# Patient Record
Sex: Male | Born: 1935 | Race: White | Hispanic: No | Marital: Married | Smoking: Former smoker
Health system: Southern US, Community
[De-identification: ages and names within clinical notes are randomized; demographics above are authoritative.]

## PROBLEM LIST (undated history)

## (undated) DIAGNOSIS — M1990 Unspecified osteoarthritis, unspecified site: Secondary | ICD-10-CM

## (undated) DIAGNOSIS — I639 Cerebral infarction, unspecified: Secondary | ICD-10-CM

## (undated) HISTORY — PX: TONSILLECTOMY: SUR1361

## (undated) HISTORY — PX: OTHER SURGICAL HISTORY: SHX169

## (undated) HISTORY — PX: APPENDECTOMY: SHX54

---

## 2006-06-29 ENCOUNTER — Ambulatory Visit: Payer: Self-pay | Admitting: Gastroenterology

## 2006-07-02 ENCOUNTER — Ambulatory Visit: Payer: Self-pay | Admitting: Gastroenterology

## 2006-07-02 ENCOUNTER — Ambulatory Visit (HOSPITAL_COMMUNITY): Admission: RE | Admit: 2006-07-02 | Discharge: 2006-07-02 | Payer: Self-pay | Admitting: Gastroenterology

## 2006-10-04 ENCOUNTER — Ambulatory Visit: Payer: Self-pay | Admitting: Gastroenterology

## 2006-10-10 ENCOUNTER — Ambulatory Visit (HOSPITAL_COMMUNITY): Admission: RE | Admit: 2006-10-10 | Discharge: 2006-10-10 | Payer: Self-pay | Admitting: Gastroenterology

## 2006-10-10 ENCOUNTER — Ambulatory Visit: Payer: Self-pay | Admitting: Gastroenterology

## 2006-12-19 ENCOUNTER — Ambulatory Visit: Payer: Self-pay | Admitting: Gastroenterology

## 2007-01-04 ENCOUNTER — Ambulatory Visit (HOSPITAL_COMMUNITY): Admission: RE | Admit: 2007-01-04 | Discharge: 2007-01-04 | Payer: Self-pay | Admitting: Gastroenterology

## 2007-01-06 ENCOUNTER — Ambulatory Visit: Payer: Self-pay | Admitting: Gastroenterology

## 2007-01-22 ENCOUNTER — Ambulatory Visit: Payer: Self-pay | Admitting: Gastroenterology

## 2011-07-02 ENCOUNTER — Emergency Department (HOSPITAL_COMMUNITY)
Admission: EM | Admit: 2011-07-02 | Discharge: 2011-07-02 | Disposition: A | Discharge status: Home / Self Care | Payer: Medicare PPO | Attending: Emergency Medicine | Admitting: Emergency Medicine

## 2011-07-02 ENCOUNTER — Encounter: Payer: Self-pay | Admitting: *Deleted

## 2011-07-02 ENCOUNTER — Emergency Department (HOSPITAL_COMMUNITY): Payer: Medicare PPO

## 2011-07-02 DIAGNOSIS — X500XXA Overexertion from strenuous movement or load, initial encounter: Secondary | ICD-10-CM | POA: Insufficient documentation

## 2011-07-02 DIAGNOSIS — M1710 Unilateral primary osteoarthritis, unspecified knee: Secondary | ICD-10-CM | POA: Insufficient documentation

## 2011-07-02 DIAGNOSIS — M79609 Pain in unspecified limb: Secondary | ICD-10-CM | POA: Insufficient documentation

## 2011-07-02 DIAGNOSIS — IMO0001 Reserved for inherently not codable concepts without codable children: Secondary | ICD-10-CM | POA: Insufficient documentation

## 2011-07-02 DIAGNOSIS — M7918 Myalgia, other site: Secondary | ICD-10-CM

## 2011-07-02 HISTORY — DX: Unspecified osteoarthritis, unspecified site: M19.90

## 2011-07-02 MED ORDER — HYDROCODONE-ACETAMINOPHEN 5-325 MG PO TABS
1.0000 | ORAL_TABLET | Freq: Once | ORAL | Status: AC
  Administered 2011-07-02: 1 via ORAL
  Filled 2011-07-02: qty 1

## 2011-07-02 MED ORDER — HYDROCODONE-ACETAMINOPHEN 5-325 MG PO TABS: ORAL_TABLET | ORAL | Status: AC

## 2011-07-02 NOTE — ED Notes (Signed)
Pt reporting decreased pain at present time.  No distress noted.  Denies any needs or concerns.

## 2011-07-02 NOTE — ED Notes (Signed)
Pt states lower back pain, radiating down right leg. Pt states right knee has also been giving out. Unable to sleep well x 3 nights due to pain.

## 2011-07-02 NOTE — ED Provider Notes (Signed)
History     CSN: [id number] Arrival date & time: 07/02/2011  5:01 PM  Chief Complaint  Patient presents with  . Back Pain    (Consider location/radiation/quality/duration/timing/severity/associated sxs/prior treatment) Patient is a 75 y.o. male presenting with back pain. The history is provided by the patient.  Back Pain  This is a new (He helped to lift a trailer of a hitch and push it away from a truck 3 days ago.  Since he has had severe pain in his right buttock and hip radiating down his right lateral thigh.) problem. The problem occurs constantly. The problem has not changed since onset.The quality of the pain is described as stabbing. The pain is at a severity of 3/10. The pain is severe. The pain is the same all the time (worse when he tries to lie on his back or his right side.). Associated symptoms include pelvic pain and leg pain. Pertinent negatives include no chest pain, no fever, no numbness, no headaches, no abdominal pain, no bowel incontinence, no perianal numbness, no paresthesias, no paresis, no tingling and no weakness. Associated symptoms comments: His right leg has collapsed on him twice,  Felt to be due to severe pain.  He denies weakness.. He has tried heat for the symptoms. The treatment provided mild relief.    Past Medical History  Diagnosis Date  . Arthritis     left knee    Past Surgical History  Procedure Date  . Appendectomy   . Tonsillectomy   . Left knee surgery     No family history on file.  History  Substance Use Topics  . Smoking status: Never Smoker   . Smokeless tobacco: Not on file  . Alcohol Use: No      Review of Systems  Constitutional: Negative for fever.  HENT: Negative for congestion, sore throat and neck pain.   Eyes: Negative.   Respiratory: Negative for chest tightness and shortness of breath.   Cardiovascular: Negative for chest pain.  Gastrointestinal: Negative for nausea, abdominal pain and bowel incontinence.    Genitourinary: Positive for pelvic pain.  Musculoskeletal: Positive for back pain. Negative for joint swelling and arthralgias.  Skin: Negative.  Negative for rash and wound.  Neurological: Negative for dizziness, tingling, weakness, light-headedness, numbness, headaches and paresthesias.  Hematological: Negative.   Psychiatric/Behavioral: Negative.     Allergies  Review of patient's allergies indicates no known allergies.  Home Medications  No current outpatient prescriptions on file.  BP 167/77  Pulse 55  Temp(Src) 98.2 F (36.8 C) (Oral)  Resp 16  Ht 5\' 10"  (1.778 m)  Wt 130 lb (58.968 kg)  BMI 18.65 kg/m2  SpO2 100%  Physical Exam  Constitutional: He is oriented to person, place, and time. He appears well-developed and well-nourished.  HENT:  Head: Normocephalic.  Eyes: Conjunctivae are normal.  Neck: Normal range of motion. Neck supple.  Cardiovascular: Regular rhythm and intact distal pulses.        Pedal pulses normal.  Pulmonary/Chest: Effort normal. He has no wheezes.  Abdominal: Soft. Bowel sounds are normal. He exhibits no distension and no mass.  Musculoskeletal: Normal range of motion. He exhibits no edema.       Lumbar back: He exhibits tenderness. He exhibits no swelling, no edema and no spasm.       Back:  Neurological: He is alert and oriented to person, place, and time. He has normal strength. He displays no atrophy and no tremor. No cranial nerve deficit or sensory  deficit. Gait normal.  Reflex Scores:      Patellar reflexes are 2+ on the right side and 2+ on the left side.      Achilles reflexes are 2+ on the right side and 2+ on the left side.      No strength deficit noted in hip and knee flexor and extensor muscle groups.  Ankle flexion and extension intact.  Skin: Skin is warm and dry.  Psychiatric: He has a normal mood and affect.    ED Course  Procedures (including critical care time)  Labs Reviewed - No data to display Dg Lumbar Spine  Complete  07/02/2011  *RADIOLOGY REPORT*  Clinical Data: Low back pain.  LUMBAR SPINE - COMPLETE 4+ VIEW  Comparison: None.  Findings: Mild degenerative disc disease at the lumbosacral junction.  Transitional anatomy at the lumbosacral junction.  No fracture.  SI joints are symmetric and unremarkable.  No malalignment.  IMPRESSION: No acute findings.  Original Report Authenticated By: Raelyn Number, M.D.   Dg Hip Complete Right  07/02/2011  *RADIOLOGY REPORT*  Clinical Data: Pain.  RIGHT HIP - COMPLETE 2+ VIEW  Comparison: None.  Findings: SI joints and hip joints are symmetric. No acute bony abnormality.  Specifically, no fracture, subluxation, or dislocation.  Soft tissues are intact.  IMPRESSION: No acute bony abnormality.  Original Report Authenticated By: Raelyn Number, M.D.     No diagnosis found.    MDM  Patient's pain almost resolved with hydrocodone 1 tab.  Patients labs and/or radiological studies were reviewed during the medical decision making and disposition process.     Myofascial strain.  Hydrocodone.  Heat.  F/u pcp in 1 week if not completely resolved.        Fulton Reek, [place] 07/02/11 1951

## 2011-07-04 NOTE — ED Provider Notes (Signed)
Medical screening examination/treatment/procedure(s) were performed by non-physician practitioner and as supervising physician I was immediately available for consultation/collaboration.  Jasper Riling. Alvino Chapel, MD 07/04/11 [phone]

## 2011-07-09 ENCOUNTER — Other Ambulatory Visit: Payer: Self-pay

## 2011-07-09 ENCOUNTER — Emergency Department (HOSPITAL_COMMUNITY): Payer: Medicare PPO

## 2011-07-09 ENCOUNTER — Encounter (HOSPITAL_COMMUNITY): Payer: Self-pay | Admitting: Emergency Medicine

## 2011-07-09 ENCOUNTER — Emergency Department (HOSPITAL_COMMUNITY)
Admission: EM | Admit: 2011-07-09 | Discharge: 2011-07-09 | Disposition: A | Discharge status: Home / Self Care | Payer: Medicare PPO | Attending: Emergency Medicine | Admitting: Emergency Medicine

## 2011-07-09 DIAGNOSIS — R0789 Other chest pain: Secondary | ICD-10-CM

## 2011-07-09 DIAGNOSIS — R42 Dizziness and giddiness: Secondary | ICD-10-CM | POA: Insufficient documentation

## 2011-07-09 DIAGNOSIS — M129 Arthropathy, unspecified: Secondary | ICD-10-CM | POA: Insufficient documentation

## 2011-07-09 DIAGNOSIS — M545 Low back pain, unspecified: Secondary | ICD-10-CM | POA: Insufficient documentation

## 2011-07-09 DIAGNOSIS — R071 Chest pain on breathing: Secondary | ICD-10-CM | POA: Insufficient documentation

## 2011-07-09 LAB — CBC
Hemoglobin: 13.8 g/dL (ref 13.0–17.0)
MCH: 30.8 pg (ref 26.0–34.0)
Platelets: 142 10*3/uL — ABNORMAL LOW (ref 150–400)
RBC: 4.48 MIL/uL (ref 4.22–5.81)
WBC: 10.9 10*3/uL — ABNORMAL HIGH (ref 4.0–10.5)

## 2011-07-09 LAB — PRO B NATRIURETIC PEPTIDE: Pro B Natriuretic peptide (BNP): 108.3 pg/mL (ref 0–125)

## 2011-07-09 LAB — DIFFERENTIAL
Eosinophils Absolute: 0.1 10*3/uL (ref 0.0–0.7)
Lymphocytes Relative: 9 % — ABNORMAL LOW (ref 12–46)
Lymphs Abs: 1 10*3/uL (ref 0.7–4.0)
Monocytes Relative: 4 % (ref 3–12)
Neutro Abs: 9.3 10*3/uL — ABNORMAL HIGH (ref 1.7–7.7)
Neutrophils Relative %: 86 % — ABNORMAL HIGH (ref 43–77)

## 2011-07-09 LAB — BASIC METABOLIC PANEL
Calcium: 9.3 mg/dL (ref 8.4–10.5)
GFR calc Af Amer: 64 mL/min — ABNORMAL LOW (ref >90)
GFR calc non Af Amer: 55 mL/min — ABNORMAL LOW (ref >90)
Potassium: 3.7 mEq/L (ref 3.5–5.1)
Sodium: 134 mEq/L — ABNORMAL LOW (ref 135–145)

## 2011-07-09 LAB — CK TOTAL AND CKMB (NOT AT ARMC)
CK, MB: 4.8 ng/mL — ABNORMAL HIGH (ref 0.3–4.0)
Relative Index: 4 — ABNORMAL HIGH (ref 0.0–2.5)
Total CK: 121 U/L (ref 7–232)

## 2011-07-09 MED ORDER — HYDROCODONE-ACETAMINOPHEN 5-325 MG PO TABS: 1.0000 | ORAL_TABLET | Freq: Four times a day (QID) | ORAL | Status: AC | PRN

## 2011-07-09 MED ORDER — MORPHINE SULFATE 2 MG/ML IJ SOLN
2.0000 mg | Freq: Once | INTRAMUSCULAR | Status: AC
  Administered 2011-07-09: 2 mg via INTRAVENOUS

## 2011-07-09 MED ORDER — NITROGLYCERIN 0.4 MG SL SUBL
0.4000 mg | SUBLINGUAL_TABLET | Freq: Once | SUBLINGUAL | Status: AC
  Administered 2011-07-09: 0.4 mg via SUBLINGUAL
  Filled 2011-07-09: qty 75

## 2011-07-09 MED ORDER — SODIUM CHLORIDE 0.9 % IV SOLN
INTRAVENOUS | Status: DC
  Administered 2011-07-09: 13:00:00 via INTRAVENOUS

## 2011-07-09 MED ORDER — MORPHINE SULFATE 2 MG/ML IJ SOLN
INTRAMUSCULAR | Status: AC
  Filled 2011-07-09: qty 1

## 2011-07-09 MED ORDER — ASPIRIN 325 MG PO TABS
325.0000 mg | ORAL_TABLET | Freq: Once | ORAL | Status: AC
  Administered 2011-07-09: 325 mg via ORAL
  Filled 2011-07-09: qty 1

## 2011-07-09 MED ORDER — SODIUM CHLORIDE 0.9 % IV BOLUS (SEPSIS)
250.0000 mL | Freq: Once | INTRAVENOUS | Status: AC
  Administered 2011-07-09: 1000 mL via INTRAVENOUS

## 2011-07-09 MED ORDER — IOHEXOL 350 MG/ML SOLN
100.0000 mL | Freq: Once | INTRAVENOUS | Status: AC | PRN
  Administered 2011-07-09: 100 mL via INTRAVENOUS

## 2011-07-09 MED ORDER — ONDANSETRON HCL 4 MG/2ML IJ SOLN
4.0000 mg | Freq: Once | INTRAMUSCULAR | Status: AC
  Administered 2011-07-09: 4 mg via INTRAVENOUS
  Filled 2011-07-09: qty 2

## 2011-07-09 NOTE — ED Notes (Signed)
C/o pain Left Shoulder onset this a.m. While watching TV ---shortly thereafter, pain moved to upper chest and across chest--Now c/o pain across upper chest which is increased with inspiration--No nausea or vomiting and no actual SOA

## 2011-07-09 NOTE — ED Notes (Signed)
B/P 134/72  HR  85 SR--c/o headache 2nd to NTG--Order for Morphine rcd and given--Tolerated well

## 2011-07-09 NOTE — ED Notes (Signed)
Pt. Was given two NTG SL---did not decrease his chest discomfort.

## 2011-07-09 NOTE — ED Notes (Signed)
B/P 146/68  SR  79---Still rates pain across chest a 4--no change with the first NTG---2nd NTG given

## 2011-07-09 NOTE — ED Notes (Signed)
Still c/o headache--waiting for lab results of final troponin.

## 2011-07-09 NOTE — ED Provider Notes (Signed)
Scribed for Mervin Kung, MD, the patient was seen in room APA04/APA04. This chart was scribed by [employer]. The patient's care started at 12:28  CSN: [id number] Arrival date & time: 07/09/2011 11:28 AM   First MD Initiated Contact with Patient 07/09/11 1228      Chief Complaint  Patient presents with  . Pleurisy   HPI ANDREE UGALDE is a 75 y.o. male who presents to the Emergency Department complaining of Chest Pain. Pain is localized to the left chest and sub sternal. Pain radiates into the neck and the right side of the chest. Pain is persistent and is aggravated by inhalation. Patient denies cough or a history of heart problems. Reports feeling nauseated after taking 2 baby aspirin. Denies diaphoresis, swelling in the legs, dysuria or headache. Reports mild dizziness. Reports back pain last week secondary to exertion.  Past Medical History  Diagnosis Date  . Arthritis     left knee    Past Surgical History  Procedure Date  . Appendectomy   . Tonsillectomy   . Left knee surgery     History reviewed. No pertinent family history.  History  Substance Use Topics  . Smoking status: Never Smoker   . Smokeless tobacco: Not on file  . Alcohol Use: No      Review of Systems  Respiratory: Negative for cough.   Gastrointestinal: Negative for vomiting and abdominal pain.  Genitourinary: Negative for dysuria.  Musculoskeletal: Positive for back pain (lower back).  Neurological: Positive for dizziness. Negative for headaches.  All other systems reviewed and are negative.    Allergies  Review of patient's allergies indicates no known allergies.  Home Medications   Current Outpatient Rx  Name Route Sig Dispense Refill  . ASPIRIN 81 MG PO TBEC Oral Take 162 mg by mouth once.      [name] HYDROCODONE-ACETAMINOPHEN 5-325 MG PO TABS  1/2 to 1 tablet by mouth every 4 hours prn pain 20 tablet 0  . MENS MULTIPLUS PO Oral Take 1 tablet by mouth daily.        BP 152/74   Pulse 75  Temp(Src) 97.4 F (36.3 C) (Oral)  Resp 20  Ht 5\' 10"  (1.778 m)  Wt 132 lb (59.875 kg)  BMI 18.94 kg/m2  SpO2 99%  Physical Exam  Nursing note and vitals reviewed. Constitutional: He is oriented to person, place, and time. He appears well-developed and well-nourished. No distress.       Awake, alert, nontoxic appearance with baseline speech for patient.  HENT:  Head: Normocephalic and atraumatic.  Mouth/Throat: Oropharynx is clear and moist. No oropharyngeal exudate.  Eyes: EOM are normal. Pupils are equal, round, and reactive to light. Right eye exhibits no discharge. Left eye exhibits no discharge.  Neck: Neck supple.  Cardiovascular: Normal rate, regular rhythm and normal heart sounds.   No murmur heard. Pulmonary/Chest: Effort normal and breath sounds normal. No stridor. No respiratory distress. He has no wheezes. He has no rales. He exhibits no tenderness.  Abdominal: Soft. Bowel sounds are normal. He exhibits no mass. There is no tenderness. There is no rebound.  Musculoskeletal: Normal range of motion. He exhibits no edema and no tenderness.       Baseline ROM, moves extremities with no obvious new focal weakness.  Lymphadenopathy:    He has no cervical adenopathy.  Neurological: He is alert and oriented to person, place, and time. No cranial nerve deficit.       Awake, alert, cooperative and  aware of situation; motor strength bilaterally; sensation normal to light touch bilaterally; peripheral visual fields full to confrontation; no facial asymmetry; tongue midline; major cranial nerves appear intact;  Skin: Skin is warm and dry. No rash noted. He is not diaphoretic. No erythema.  Psychiatric: He has a normal mood and affect.    ED Course  Procedures   DIAGNOSTIC STUDIES: Oxygen Saturation is 99% on [place], normal by my interpretation.    COORDINATION OF CARE: 12:40 - EDP examined patient at bedside and ordered the following Orders Placed This Encounter    Procedures  . DG Chest Portable 1 View  . Basic metabolic panel  . CBC  . Differential  . CK total and CKMB  . Troponin I  . Cardiac monitoring      Results for orders placed during the hospital encounter of [id number]  BASIC METABOLIC PANEL      Component Value Range   Sodium 134 (*) 135 - 145 (mEq/L)   Potassium 3.7  3.5 - 5.1 (mEq/L)   Chloride 99  96 - 112 (mEq/L)   CO2 28  19 - 32 (mEq/L)   Glucose, Bld 132 (*) 70 - 99 (mg/dL)   BUN 23  6 - 23 (mg/dL)   Creatinine, Ser 1.25  0.50 - 1.35 (mg/dL)   Calcium 9.3  8.4 - 10.5 (mg/dL)   GFR calc non Af Amer 55 (*) >90 (mL/min)   GFR calc Af Amer 64 (*) >90 (mL/min)  CBC      Component Value Range   WBC 10.9 (*) 4.0 - 10.5 (K/uL)   RBC 4.48  4.22 - 5.81 (MIL/uL)   Hemoglobin 13.8  13.0 - 17.0 (g/dL)   HCT 41.0  39.0 - 52.0 (%)   MCV 91.5  78.0 - 100.0 (fL)   MCH 30.8  26.0 - 34.0 (pg)   MCHC 33.7  30.0 - 36.0 (g/dL)   RDW 13.0  11.5 - 15.5 (%)   Platelets 142 (*) 150 - 400 (K/uL)  DIFFERENTIAL      Component Value Range   Neutrophils Relative 86 (*) 43 - 77 (%)   Neutro Abs 9.3 (*) 1.7 - 7.7 (K/uL)   Lymphocytes Relative 9 (*) 12 - 46 (%)   Lymphs Abs 1.0  0.7 - 4.0 (K/uL)   Monocytes Relative 4  3 - 12 (%)   Monocytes Absolute 0.5  0.1 - 1.0 (K/uL)   Eosinophils Relative 1  0 - 5 (%)   Eosinophils Absolute 0.1  0.0 - 0.7 (K/uL)   Basophils Relative 0  0 - 1 (%)   Basophils Absolute 0.0  0.0 - 0.1 (K/uL)  CK TOTAL AND CKMB      Component Value Range   Total CK 121  7 - 232 (U/L)   CK, MB 4.8 (*) 0.3 - 4.0 (ng/mL)   Relative Index 4.0 (*) 0.0 - 2.5   TROPONIN I      Component Value Range   Troponin I <0.30  <0.30 (ng/mL)    Dg Chest Portable 1 View  07/09/2011  *RADIOLOGY REPORT*  Clinical Data: Chest pain.  Dyspnea.  PORTABLE CHEST - 1 VIEW  Comparison: None.  Findings: Cardiomegaly.  Central pulmonary vascular prominence. Mildly tortuous aorta.  No infiltrate, congestive heart failure or pneumothorax.   IMPRESSION: Cardiomegaly.  Central pulmonary vascular prominence.  Mildly tortuous aorta.  Original Report Authenticated By: Doug Sou, M.D.    Results for orders placed during the hospital encounter of 07/09/11  BASIC METABOLIC PANEL      Component Value Range   Sodium 134 (*) 135 - 145 (mEq/L)   Potassium 3.7  3.5 - 5.1 (mEq/L)   Chloride 99  96 - 112 (mEq/L)   CO2 28  19 - 32 (mEq/L)   Glucose, Bld 132 (*) 70 - 99 (mg/dL)   BUN 23  6 - 23 (mg/dL)   Creatinine, Ser 1.25  0.50 - 1.35 (mg/dL)   Calcium 9.3  8.4 - 10.5 (mg/dL)   GFR calc non Af Amer 55 (*) >90 (mL/min)   GFR calc Af Amer 64 (*) >90 (mL/min)  CBC      Component Value Range   WBC 10.9 (*) 4.0 - 10.5 (K/uL)   RBC 4.48  4.22 - 5.81 (MIL/uL)   Hemoglobin 13.8  13.0 - 17.0 (g/dL)   HCT 41.0  39.0 - 52.0 (%)   MCV 91.5  78.0 - 100.0 (fL)   MCH 30.8  26.0 - 34.0 (pg)   MCHC 33.7  30.0 - 36.0 (g/dL)   RDW 13.0  11.5 - 15.5 (%)   Platelets 142 (*) 150 - 400 (K/uL)  DIFFERENTIAL      Component Value Range   Neutrophils Relative 86 (*) 43 - 77 (%)   Neutro Abs 9.3 (*) 1.7 - 7.7 (K/uL)   Lymphocytes Relative 9 (*) 12 - 46 (%)   Lymphs Abs 1.0  0.7 - 4.0 (K/uL)   Monocytes Relative 4  3 - 12 (%)   Monocytes Absolute 0.5  0.1 - 1.0 (K/uL)   Eosinophils Relative 1  0 - 5 (%)   Eosinophils Absolute 0.1  0.0 - 0.7 (K/uL)   Basophils Relative 0  0 - 1 (%)   Basophils Absolute 0.0  0.0 - 0.1 (K/uL)  CK TOTAL AND CKMB      Component Value Range   Total CK 121  7 - 232 (U/L)   CK, MB 4.8 (*) 0.3 - 4.0 (ng/mL)   Relative Index 4.0 (*) 0.0 - 2.5   TROPONIN I      Component Value Range   Troponin I <0.30  <0.30 (ng/mL)  PRO B NATRIURETIC PEPTIDE      Component Value Range   BNP, POC 108.3  0 - 125 (pg/mL)  TROPONIN I      Component Value Range   Troponin I <0.30  <0.30 (ng/mL)   Dg Lumbar Spine Complete  07/02/2011  *RADIOLOGY REPORT*  Clinical Data: Low back pain.  LUMBAR SPINE - COMPLETE 4+ VIEW  Comparison:  None.  Findings: Mild degenerative disc disease at the lumbosacral junction.  Transitional anatomy at the lumbosacral junction.  No fracture.  SI joints are symmetric and unremarkable.  No malalignment.  IMPRESSION: No acute findings.  Original Report Authenticated By: Raelyn Number, M.D.   Dg Hip Complete Right  07/02/2011  *RADIOLOGY REPORT*  Clinical Data: Pain.  RIGHT HIP - COMPLETE 2+ VIEW  Comparison: None.  Findings: SI joints and hip joints are symmetric. No acute bony abnormality.  Specifically, no fracture, subluxation, or dislocation.  Soft tissues are intact.  IMPRESSION: No acute bony abnormality.  Original Report Authenticated By: Raelyn Number, M.D.   Ct Angio Chest W/cm &/or Wo Cm  07/09/2011  *RADIOLOGY REPORT*  Clinical Data:  Chest pain.  Concern for pulmonary embolism.  CT ANGIOGRAPHY CHEST WITH CONTRAST  Technique:  Multidetector CT imaging of the chest was performed using the standard protocol during bolus administration of intravenous contrast.  Multiplanar CT image reconstructions including MIPs were obtained to evaluate the vascular anatomy.  Contrast: 151mL OMNIPAQUE IOHEXOL 350 MG/ML IV SOLN  Comparison:  Chest radiograph 07/09/2011  Findings:  There are no filling defects in the pulmonary arteries to suggest acute pulmonary embolism.  No acute findings of the aorta or great vessels.  No pericardial fluid.  No axillary or supraclavicular lymphadenopathy.  No mediastinal adenopathy.  Esophagus is normal. Review of the upper abdomen is unremarkable.  Review of the lung parenchyma demonstrates mild basilar atelectasis / vascular congestion.  No evidence of pneumonia.  Airways are normal.  Review of  bone windows demonstrates no aggressive osseous lesions.  Review of the MIP images confirms the above findings.  IMPRESSION:  1.  No acute pulmonary embolism. 2.  Mild basilar atelectasis.  Original Report Authenticated By: Suzy Bouchard, M.D.   Dg Chest Portable 1 View  07/09/2011   *RADIOLOGY REPORT*  Clinical Data: Chest pain.  Dyspnea.  PORTABLE CHEST - 1 VIEW  Comparison: None.  Findings: Cardiomegaly.  Central pulmonary vascular prominence. Mildly tortuous aorta.  No infiltrate, congestive heart failure or pneumothorax.  IMPRESSION: Cardiomegaly.  Central pulmonary vascular prominence.  Mildly tortuous aorta.  Original Report Authenticated By: Doug Sou, M.D.      Date: 07/09/2011  Rate: 67  Rhythm: normal sinus rhythm  QRS Axis: normal  Intervals: normal  ST/T Wave abnormalities: normal  Conduction Disutrbances:none  Narrative Interpretation:   Old EKG Reviewed: none available    MDM:  CT angiogram negative for pulmonary embolism pneumothorax pulmonary edema or pneumonia. Patient's pleuritic chest pain improved in ED with morphine. We'll obtain a six-hour cardiac marker troponin did be sure that it is negative and therefore ruling out acute cardiac event. Time for that marker to be drawn will be after 4:00 in the afternoon.    Scribe Attestation I personally performed the services described in this documentation, which was scribed in my presence. The recorded information has been reviewed and considered.     Mervin Kung, MD 07/09/11 1536

## 2012-11-02 ENCOUNTER — Encounter (HOSPITAL_COMMUNITY): Payer: Self-pay | Admitting: Emergency Medicine

## 2012-11-02 ENCOUNTER — Emergency Department (HOSPITAL_COMMUNITY)
Admission: EM | Admit: 2012-11-02 | Discharge: 2012-11-03 | Disposition: A | Discharge status: Home / Self Care | Payer: Medicare PPO | Attending: Emergency Medicine | Admitting: Emergency Medicine

## 2012-11-02 DIAGNOSIS — Z7982 Long term (current) use of aspirin: Secondary | ICD-10-CM | POA: Insufficient documentation

## 2012-11-02 DIAGNOSIS — R42 Dizziness and giddiness: Secondary | ICD-10-CM

## 2012-11-02 DIAGNOSIS — Z87891 Personal history of nicotine dependence: Secondary | ICD-10-CM | POA: Insufficient documentation

## 2012-11-02 DIAGNOSIS — Z8739 Personal history of other diseases of the musculoskeletal system and connective tissue: Secondary | ICD-10-CM | POA: Insufficient documentation

## 2012-11-02 DIAGNOSIS — I10 Essential (primary) hypertension: Secondary | ICD-10-CM | POA: Insufficient documentation

## 2012-11-02 NOTE — ED Notes (Signed)
Patient states he watched the news at 10pm and then got up to get ready for bed.  Patient states that he got dizzy and checked his BP at home; states was 198/80.  Patient denies pain at this time; c/o dizziness with movement.

## 2012-11-03 ENCOUNTER — Other Ambulatory Visit: Payer: Self-pay

## 2012-11-03 LAB — BASIC METABOLIC PANEL
BUN: 24 mg/dL — ABNORMAL HIGH (ref 6–23)
Calcium: 9 mg/dL (ref 8.4–10.5)
GFR calc non Af Amer: 49 mL/min — ABNORMAL LOW (ref >90)
Glucose, Bld: 97 mg/dL (ref 70–99)
Sodium: 137 mEq/L (ref 135–145)

## 2012-11-03 LAB — CBC WITH DIFFERENTIAL/PLATELET
Basophils Relative: 1 % (ref 0–1)
Eosinophils Absolute: 0.2 10*3/uL (ref 0.0–0.7)
Eosinophils Relative: 3 % (ref 0–5)
Lymphs Abs: 1.6 10*3/uL (ref 0.7–4.0)
MCH: 31.2 pg (ref 26.0–34.0)
MCHC: 34.2 g/dL (ref 30.0–36.0)
MCV: 91.1 fL (ref 78.0–100.0)
Monocytes Relative: 7 % (ref 3–12)
Platelets: 143 10*3/uL — ABNORMAL LOW (ref 150–400)
RBC: 4.17 MIL/uL — ABNORMAL LOW (ref 4.22–5.81)

## 2012-11-03 MED ORDER — SODIUM CHLORIDE 0.9 % IV BOLUS (SEPSIS)
1000.0000 mL | Freq: Once | INTRAVENOUS | Status: AC
  Administered 2012-11-03: 1000 mL via INTRAVENOUS

## 2012-11-03 NOTE — ED Provider Notes (Addendum)
History     CSN: [id number]  Arrival date & time 11/02/12  2335   First MD Initiated Contact with Patient 11/03/12 0009      Chief Complaint  Patient presents with  . Hypertension  . Dizziness    (Consider location/radiation/quality/duration/timing/severity/associated sxs/prior treatment) HPI Cameron Boyle is a 77 y.o. male who presents to the Emergency Department complaining of hypertension and dizziness. He got up to go to bed tonight and felt dizzy. He went downstairs and picked up wood for his stove and each time he bent over he felt dizzy. He took his blood pressure several times. His blood pressure was 198/88 after taking it and waiting. He is currently dizzy with position changes.   PCP Dr. Everette Rank  Past Medical History  Diagnosis Date  . Arthritis     left knee    Past Surgical History  Procedure Laterality Date  . Appendectomy    . Tonsillectomy    . Left knee surgery      No family history on file.  History  Substance Use Topics  . Smoking status: Former Research scientist (life sciences)  . Smokeless tobacco: Not on file  . Alcohol Use: No      Review of Systems  Constitutional: Negative for fever.       10 Systems reviewed and are negative for acute change except as noted in the HPI.  HENT: Negative for congestion.   Eyes: Negative for discharge and redness.  Respiratory: Negative for cough and shortness of breath.   Cardiovascular: Negative for chest pain.  Gastrointestinal: Negative for vomiting and abdominal pain.  Musculoskeletal: Negative for back pain.  Skin: Negative for rash.  Neurological: Negative for syncope, numbness and headaches.  Psychiatric/Behavioral:       No behavior change.    Allergies  Review of patient's allergies indicates no known allergies.  Home Medications   Current Outpatient Rx  Name  Route  Sig  Dispense  Refill  . aspirin 81 MG EC tablet   Oral   Take 162 mg by mouth once.           . Multiple Vitamins-Minerals (MENS MULTIPLUS  PO)   Oral   Take 1 tablet by mouth daily.           [name] HYDROcodone-acetaminophen (NORCO) 5-325 MG per tablet      1/2 to 1 tablet by mouth every 4 hours prn pain   20 tablet   0     BP 177/74  Pulse 53  Temp(Src) 97.7 F (36.5 C) (Oral)  Resp 18  Ht 5\' 10"  (1.778 m)  Wt 133 lb (60.328 kg)  BMI 19.08 kg/m2  SpO2 99%  Physical Exam  Nursing note and vitals reviewed. Constitutional: He is oriented to person, place, and time. He appears well-developed and well-nourished.  Awake, alert, nontoxic appearance.  HENT:  Head: Atraumatic.  Eyes: EOM are normal. Pupils are equal, round, and reactive to light. Right eye exhibits no discharge. Left eye exhibits no discharge.  Neck: Normal range of motion. Neck supple.  Cardiovascular: Normal rate and normal heart sounds.   Pulmonary/Chest: Effort normal and breath sounds normal. He exhibits no tenderness.  Abdominal: Soft. There is no tenderness. There is no rebound.  Musculoskeletal: Normal range of motion. He exhibits no tenderness.  Baseline ROM, no obvious new focal weakness.  Neurological: He is alert and oriented to person, place, and time.  Mental status and motor strength appears baseline for patient and situation.  Skin: No  rash noted.  Psychiatric: He has a normal mood and affect.    ED Course  Procedures (including critical care time) Results for orders placed during the hospital encounter of 11/02/12  CBC WITH DIFFERENTIAL      Result Value Range   WBC 5.8  4.0 - 10.5 K/uL   RBC 4.17 (*) 4.22 - 5.81 MIL/uL   Hemoglobin 13.0  13.0 - 17.0 g/dL   HCT 38.0 (*) 39.0 - 52.0 %   MCV 91.1  78.0 - 100.0 fL   MCH 31.2  26.0 - 34.0 pg   MCHC 34.2  30.0 - 36.0 g/dL   RDW 13.2  11.5 - 15.5 %   Platelets 143 (*) 150 - 400 K/uL   Neutrophils Relative 63  43 - 77 %   Neutro Abs 3.7  1.7 - 7.7 K/uL   Lymphocytes Relative 27  12 - 46 %   Lymphs Abs 1.6  0.7 - 4.0 K/uL   Monocytes Relative 7  3 - 12 %   Monocytes Absolute 0.4   0.1 - 1.0 K/uL   Eosinophils Relative 3  0 - 5 %   Eosinophils Absolute 0.2  0.0 - 0.7 K/uL   Basophils Relative 1  0 - 1 %   Basophils Absolute 0.0  0.0 - 0.1 K/uL  BASIC METABOLIC PANEL      Result Value Range   Sodium 137  135 - 145 mEq/L   Potassium 4.2  3.5 - 5.1 mEq/L   Chloride 103  96 - 112 mEq/L   CO2 26  19 - 32 mEq/L   Glucose, Bld 97  70 - 99 mg/dL   BUN 24 (*) 6 - 23 mg/dL   Creatinine, Ser 1.37 (*) 0.50 - 1.35 mg/dL   Calcium 9.0  8.4 - 10.5 mg/dL   GFR calc non Af Amer 49 (*) >90 mL/min   GFR calc Af Amer 57 (*) >90 mL/min    Date: 11/03/2012   0014  Rate: 53  Rhythm: sinus bradycardia  QRS Axis: normal  Intervals: normal  ST/T Wave abnormalities: normal  Conduction Disutrbances:none  Narrative Interpretation:   Old EKG Reviewed: none available   0154 Repeat orthostatics with negative dizziness. MDM  Patient with dizziness, hypertension when getting ready for bed tonight. Given IVF. Labs are unremarkable. After receiving fluids, no further dizziness.Pt stable in ED with no significant deterioration in condition.The patient appears reasonably screened and/or stabilized for discharge and I doubt any other medical condition or other [facility] requiring further screening, evaluation, or treatment in the ED at this time prior to discharge.  MDM Reviewed: nursing note and vitals Interpretation: labs and ECG           Gypsy Balsam. Olin Hauser, MD 11/03/12 0157  Gypsy Balsam. Olin Hauser, MD 11/17/12 [phone]

## 2013-09-02 ENCOUNTER — Other Ambulatory Visit (HOSPITAL_COMMUNITY): Payer: Self-pay | Admitting: Family Medicine

## 2013-09-02 ENCOUNTER — Ambulatory Visit (HOSPITAL_COMMUNITY)
Admission: RE | Admit: 2013-09-02 | Discharge: 2013-09-02 | Disposition: A | Discharge status: Home / Self Care | Payer: Medicare PPO | Source: Ambulatory Visit | Attending: Family Medicine | Admitting: Family Medicine

## 2013-09-02 DIAGNOSIS — Z87891 Personal history of nicotine dependence: Secondary | ICD-10-CM | POA: Insufficient documentation

## 2016-11-14 ENCOUNTER — Telehealth: Payer: Self-pay | Admitting: Gastroenterology

## 2016-11-14 NOTE — Telephone Encounter (Signed)
RECALL FOR TCS °

## 2016-11-14 NOTE — Telephone Encounter (Signed)
Letter mailed

## 2018-07-19 ENCOUNTER — Emergency Department (HOSPITAL_COMMUNITY): Payer: Medicare PPO

## 2018-07-19 ENCOUNTER — Encounter (HOSPITAL_COMMUNITY): Payer: Self-pay | Admitting: Emergency Medicine

## 2018-07-19 ENCOUNTER — Inpatient Hospital Stay (HOSPITAL_COMMUNITY): Payer: Medicare PPO

## 2018-07-19 ENCOUNTER — Other Ambulatory Visit: Payer: Self-pay

## 2018-07-19 ENCOUNTER — Inpatient Hospital Stay (HOSPITAL_COMMUNITY)
Admission: EM | Admit: 2018-07-19 | Discharge: 2018-07-22 | DRG: 041 | Disposition: A | Discharge status: Home / Self Care | Payer: Medicare PPO | Attending: Neurology | Admitting: Neurology

## 2018-07-19 DIAGNOSIS — Z79891 Long term (current) use of opiate analgesic: Secondary | ICD-10-CM

## 2018-07-19 DIAGNOSIS — M1712 Unilateral primary osteoarthritis, left knee: Secondary | ICD-10-CM | POA: Diagnosis not present

## 2018-07-19 DIAGNOSIS — R29810 Facial weakness: Secondary | ICD-10-CM | POA: Diagnosis not present

## 2018-07-19 DIAGNOSIS — R29703 NIHSS score 3: Secondary | ICD-10-CM | POA: Diagnosis not present

## 2018-07-19 DIAGNOSIS — Z9282 Status post administration of tPA (rtPA) in a different facility within the last 24 hours prior to admission to current facility: Secondary | ICD-10-CM | POA: Diagnosis not present

## 2018-07-19 DIAGNOSIS — Z87891 Personal history of nicotine dependence: Secondary | ICD-10-CM

## 2018-07-19 DIAGNOSIS — I639 Cerebral infarction, unspecified: Secondary | ICD-10-CM | POA: Diagnosis present

## 2018-07-19 DIAGNOSIS — I129 Hypertensive chronic kidney disease with stage 1 through stage 4 chronic kidney disease, or unspecified chronic kidney disease: Secondary | ICD-10-CM | POA: Diagnosis present

## 2018-07-19 DIAGNOSIS — I6389 Other cerebral infarction: Secondary | ICD-10-CM | POA: Diagnosis not present

## 2018-07-19 DIAGNOSIS — I5030 Unspecified diastolic (congestive) heart failure: Secondary | ICD-10-CM

## 2018-07-19 DIAGNOSIS — N189 Chronic kidney disease, unspecified: Secondary | ICD-10-CM

## 2018-07-19 DIAGNOSIS — Z7982 Long term (current) use of aspirin: Secondary | ICD-10-CM

## 2018-07-19 DIAGNOSIS — R471 Dysarthria and anarthria: Secondary | ICD-10-CM | POA: Diagnosis present

## 2018-07-19 DIAGNOSIS — R270 Ataxia, unspecified: Secondary | ICD-10-CM | POA: Diagnosis present

## 2018-07-19 DIAGNOSIS — N179 Acute kidney failure, unspecified: Secondary | ICD-10-CM | POA: Diagnosis present

## 2018-07-19 DIAGNOSIS — D631 Anemia in chronic kidney disease: Secondary | ICD-10-CM | POA: Diagnosis present

## 2018-07-19 DIAGNOSIS — I161 Hypertensive emergency: Secondary | ICD-10-CM | POA: Diagnosis not present

## 2018-07-19 DIAGNOSIS — Z79899 Other long term (current) drug therapy: Secondary | ICD-10-CM | POA: Diagnosis not present

## 2018-07-19 DIAGNOSIS — I63411 Cerebral infarction due to embolism of right middle cerebral artery: Principal | ICD-10-CM | POA: Diagnosis present

## 2018-07-19 DIAGNOSIS — I340 Nonrheumatic mitral (valve) insufficiency: Secondary | ICD-10-CM | POA: Diagnosis not present

## 2018-07-19 DIAGNOSIS — N183 Chronic kidney disease, stage 3 (moderate): Secondary | ICD-10-CM | POA: Diagnosis present

## 2018-07-19 DIAGNOSIS — I10 Essential (primary) hypertension: Secondary | ICD-10-CM

## 2018-07-19 DIAGNOSIS — I6340 Cerebral infarction due to embolism of unspecified cerebral artery: Secondary | ICD-10-CM | POA: Diagnosis not present

## 2018-07-19 DIAGNOSIS — E785 Hyperlipidemia, unspecified: Secondary | ICD-10-CM

## 2018-07-19 LAB — URINALYSIS, ROUTINE W REFLEX MICROSCOPIC
Bilirubin Urine: NEGATIVE
Glucose, UA: 50 mg/dL — AB
Hgb urine dipstick: NEGATIVE
Ketones, ur: NEGATIVE mg/dL
Leukocytes, UA: NEGATIVE
Nitrite: NEGATIVE
Protein, ur: 100 mg/dL — AB
Specific Gravity, Urine: 1.008 (ref 1.005–1.030)
pH: 7 (ref 5.0–8.0)

## 2018-07-19 LAB — COMPREHENSIVE METABOLIC PANEL
ALT: 13 U/L (ref 0–44)
AST: 21 U/L (ref 15–41)
Albumin: 3.5 g/dL (ref 3.5–5.0)
Alkaline Phosphatase: 66 U/L (ref 38–126)
Anion gap: 7 (ref 5–15)
BUN: 44 mg/dL — ABNORMAL HIGH (ref 8–23)
CO2: 23 mmol/L (ref 22–32)
Calcium: 8.4 mg/dL — ABNORMAL LOW (ref 8.9–10.3)
Chloride: 112 mmol/L — ABNORMAL HIGH (ref 98–111)
Creatinine, Ser: 3.03 mg/dL — ABNORMAL HIGH (ref 0.61–1.24)
GFR calc Af Amer: 21 mL/min — ABNORMAL LOW (ref >60)
GFR calc non Af Amer: 18 mL/min — ABNORMAL LOW (ref >60)
Glucose, Bld: 122 mg/dL — ABNORMAL HIGH (ref 70–99)
Potassium: 4.4 mmol/L (ref 3.5–5.1)
Sodium: 142 mmol/L (ref 135–145)
Total Bilirubin: 0.5 mg/dL (ref 0.3–1.2)
Total Protein: 6.9 g/dL (ref 6.5–8.1)

## 2018-07-19 LAB — DIFFERENTIAL
Abs Immature Granulocytes: 0.03 10*3/uL (ref 0.00–0.07)
Basophils Absolute: 0.1 10*3/uL (ref 0.0–0.1)
Basophils Relative: 1 %
Eosinophils Absolute: 0.4 10*3/uL (ref 0.0–0.5)
Eosinophils Relative: 7 %
Immature Granulocytes: 1 %
Lymphocytes Relative: 22 %
Lymphs Abs: 1.4 10*3/uL (ref 0.7–4.0)
Monocytes Absolute: 0.4 10*3/uL (ref 0.1–1.0)
Monocytes Relative: 7 %
Neutro Abs: 3.9 10*3/uL (ref 1.7–7.7)
Neutrophils Relative %: 62 %

## 2018-07-19 LAB — CBC
HCT: 32.3 % — ABNORMAL LOW (ref 39.0–52.0)
Hemoglobin: 10 g/dL — ABNORMAL LOW (ref 13.0–17.0)
MCH: 29.8 pg (ref 26.0–34.0)
MCHC: 31 g/dL (ref 30.0–36.0)
MCV: 96.1 fL (ref 80.0–100.0)
Platelets: 161 10*3/uL (ref 150–400)
RBC: 3.36 MIL/uL — ABNORMAL LOW (ref 4.22–5.81)
RDW: 12.6 % (ref 11.5–15.5)
WBC: 6.2 10*3/uL (ref 4.0–10.5)
nRBC: 0 % (ref 0.0–0.2)

## 2018-07-19 LAB — PROTIME-INR
INR: 1.18
Prothrombin Time: 14.9 seconds (ref 11.4–15.2)

## 2018-07-19 LAB — I-STAT TROPONIN, ED: Troponin i, poc: 0.01 ng/mL (ref 0.00–0.08)

## 2018-07-19 LAB — I-STAT CHEM 8, ED
BUN: 41 mg/dL — ABNORMAL HIGH (ref 8–23)
Calcium, Ion: 1.17 mmol/L (ref 1.15–1.40)
Chloride: 107 mmol/L (ref 98–111)
Creatinine, Ser: 3.3 mg/dL — ABNORMAL HIGH (ref 0.61–1.24)
Glucose, Bld: 111 mg/dL — ABNORMAL HIGH (ref 70–99)
HCT: 28 % — ABNORMAL LOW (ref 39.0–52.0)
Hemoglobin: 9.5 g/dL — ABNORMAL LOW (ref 13.0–17.0)
Potassium: 4.4 mmol/L (ref 3.5–5.1)
Sodium: 142 mmol/L (ref 135–145)
TCO2: 24 mmol/L (ref 22–32)

## 2018-07-19 LAB — ECHOCARDIOGRAM COMPLETE: Weight: 1968 oz

## 2018-07-19 LAB — RAPID URINE DRUG SCREEN, HOSP PERFORMED
Amphetamines: NOT DETECTED
Barbiturates: NOT DETECTED
Benzodiazepines: NOT DETECTED
Cocaine: NOT DETECTED
Opiates: NOT DETECTED
Tetrahydrocannabinol: NOT DETECTED

## 2018-07-19 LAB — ETHANOL: Alcohol, Ethyl (B): <10 mg/dL (ref <10)

## 2018-07-19 LAB — CBG MONITORING, ED: Glucose-Capillary: 109 mg/dL — ABNORMAL HIGH (ref 70–99)

## 2018-07-19 LAB — GLUCOSE, CAPILLARY: Glucose-Capillary: 88 mg/dL (ref 70–99)

## 2018-07-19 LAB — APTT: aPTT: 30 seconds (ref 24–36)

## 2018-07-19 MED ORDER — LABETALOL HCL 5 MG/ML IV SOLN
10.0000 mg | Freq: Once | INTRAVENOUS | Status: AC
  Administered 2018-07-19: 10 mg via INTRAVENOUS

## 2018-07-19 MED ORDER — NICARDIPINE HCL IN NACL 20-0.86 MG/200ML-% IV SOLN
0.0000 mg/h | INTRAVENOUS | Status: DC
  Administered 2018-07-19 – 2018-07-20 (×3): 2.5 mg/h via INTRAVENOUS
  Filled 2018-07-19 (×2): qty 200

## 2018-07-19 MED ORDER — NICARDIPINE HCL IN NACL 20-0.86 MG/200ML-% IV SOLN
INTRAVENOUS | Status: AC
  Filled 2018-07-19: qty 200

## 2018-07-19 MED ORDER — SODIUM CHLORIDE 0.9 % IV SOLN
INTRAVENOUS | Status: DC
  Administered 2018-07-19 – 2018-07-20 (×2): via INTRAVENOUS

## 2018-07-19 MED ORDER — SODIUM CHLORIDE 0.9 % IV SOLN
50.0000 mL | Freq: Once | INTRAVENOUS | Status: AC
  Administered 2018-07-19: 20 mL via INTRAVENOUS

## 2018-07-19 MED ORDER — LABETALOL HCL 5 MG/ML IV SOLN
INTRAVENOUS | Status: AC
  Filled 2018-07-19: qty 4

## 2018-07-19 MED ORDER — ALTEPLASE 100 MG IV SOLR
INTRAVENOUS | Status: AC
  Filled 2018-07-19: qty 100

## 2018-07-19 MED ORDER — ASPIRIN 81 MG PO CHEW: 324.0000 mg | CHEWABLE_TABLET | Freq: Once | ORAL | Status: DC

## 2018-07-19 MED ORDER — ACETAMINOPHEN 160 MG/5ML PO SOLN: 650.0000 mg | ORAL | Status: DC | PRN

## 2018-07-19 MED ORDER — ACETAMINOPHEN 650 MG RE SUPP: 650.0000 mg | RECTAL | Status: DC | PRN

## 2018-07-19 MED ORDER — NICARDIPINE HCL IN NACL 20-0.86 MG/200ML-% IV SOLN
0.0000 mg/h | INTRAVENOUS | Status: DC
  Administered 2018-07-19: 5 mg/h via INTRAVENOUS

## 2018-07-19 MED ORDER — ALTEPLASE (STROKE) FULL DOSE INFUSION
0.9000 mg/kg | Freq: Once | INTRAVENOUS | Status: AC
  Administered 2018-07-19: 50.2 mg via INTRAVENOUS

## 2018-07-19 MED ORDER — SODIUM CHLORIDE 0.9 % IV SOLN
INTRAVENOUS | Status: DC | PRN
  Administered 2018-07-19: 500 mL via INTRAVENOUS

## 2018-07-19 MED ORDER — ACETAMINOPHEN 325 MG PO TABS: 650.0000 mg | ORAL_TABLET | ORAL | Status: DC | PRN

## 2018-07-19 MED ORDER — PANTOPRAZOLE SODIUM 40 MG IV SOLR
40.0000 mg | Freq: Every day | INTRAVENOUS | Status: DC
  Administered 2018-07-19: 40 mg via INTRAVENOUS
  Filled 2018-07-19: qty 40

## 2018-07-19 MED ORDER — STROKE: EARLY STAGES OF RECOVERY BOOK
Freq: Once | Status: AC
  Administered 2018-07-19: 14:00:00
  Filled 2018-07-19: qty 1

## 2018-07-19 NOTE — Plan of Care (Signed)
  Problem: Ischemic Stroke/TIA Tissue Perfusion: Goal: Complications of ischemic stroke/TIA will be minimized Outcome: Progressing   Problem: Nutrition: Goal: Dietary intake will improve Outcome: Progressing   Problem: Nutrition: Goal: Risk of aspiration will decrease Outcome: Progressing   Problem: Self-Care: Goal: Ability to communicate needs accurately will improve Outcome: Progressing   Problem: Self-Care: Goal: Verbalization of feelings and concerns over difficulty with self-care will improve Outcome: Progressing   Problem: Self-Care: Goal: Ability to participate in self-care as condition permits will improve Outcome: Progressing   Problem: Health Behavior/Discharge Planning: Goal: Ability to manage health-related needs will improve Outcome: Progressing   Problem: Coping: Goal: Will identify appropriate support needs Outcome: Progressing   Problem: Coping: Goal: Will verbalize positive feelings about self Outcome: Progressing   Problem: Education: Goal: Individualized Educational Video(s) Outcome: Progressing   Problem: Education: Goal: Knowledge of patient specific risk factors addressed and post discharge goals established will improve Outcome: Progressing   Problem: Education: Goal: Knowledge of secondary prevention will improve Outcome: Progressing

## 2018-07-19 NOTE — ED Provider Notes (Addendum)
[facility] EMERGENCY DEPARTMENT Provider Note   CSN: [id number] Arrival date & time: 07/19/18  4765     History   Chief Complaint Chief Complaint  Patient presents with  . Code Stroke    HPI Cameron Boyle is a 82 y.o. male.  HPI   82 year old male with strokelike symptoms.  Patient woke up around 530 this morning in his usual state of health.  He laid back down around 7 AM which he often does in the morning.  He woke up around 8 AM and he felt like his left arm "was dead weight."  Family noticed a facial droop as well and EMS was called.  Reportedly some dysarthria but patient does not feel like his voice is significantly changed.  He is still having left arm weakness.  Denies any acute pain.  No acute visual changes.   Past Medical History:  Diagnosis Date  . Arthritis    left knee    There are no active problems to display for this patient.   Past Surgical History:  Procedure Laterality Date  . APPENDECTOMY    . left knee surgery    . TONSILLECTOMY          Home Medications    Prior to Admission medications   Medication Sig Start Date End Date Taking? Authorizing Provider  aspirin 81 MG EC tablet Take 162 mg by mouth once.      [provider]  HYDROcodone-acetaminophen (NORCO) 5-325 MG per tablet 1/2 to 1 tablet by mouth every 4 hours prn pain 07/02/11   Idol, Almyra Free, PA-C  Multiple Vitamins-Minerals (MENS MULTIPLUS PO) Take 1 tablet by mouth daily.      [provider]    Family History History reviewed. No pertinent family history.  Social History Social History   Tobacco Use  . Smoking status: Former Research scientist (life sciences)  . Smokeless tobacco: Never Used  Substance Use Topics  . Alcohol use: No  . Drug use: No     Allergies   Patient has no known allergies.   Review of Systems Review of Systems  All systems reviewed and negative, other than as noted in HPI.  Physical Exam Updated Vital Signs There were no vitals taken for this  visit.  Physical Exam  Constitutional: He appears well-developed and well-nourished. No distress.  HENT:  Head: Normocephalic and atraumatic.  Eyes: Conjunctivae are normal. Right eye exhibits no discharge. Left eye exhibits no discharge.  Neck: Neck supple.  Cardiovascular: Normal rate, regular rhythm and normal heart sounds. Exam reveals no gallop and no friction rub.  No murmur heard. Pulmonary/Chest: Effort normal and breath sounds normal. No respiratory distress.  Abdominal: Soft. He exhibits no distension. There is no tenderness.  Musculoskeletal: He exhibits no edema or tenderness.  Neurological: He is alert.  Awake.  Alert.  Speech is somewhat slow but not really dysarthric.  He is answering questions appropriately. CN 2-12 intact.  Muscle contractions LUE and some movement but not enough to quite overcome gravity at shoulder or elbow .  Strength is 5 out of 5 right upper and both lower extremities.  He has decreased sensation in his left hand extending up to the shoulder.  Skin: Skin is warm and dry.  Psychiatric: He has a normal mood and affect. His behavior is normal. Thought content normal.  Nursing note and vitals reviewed.    ED Treatments / Results  Labs (all labs ordered are listed, but only abnormal results are displayed) Labs Reviewed  CBC - Abnormal; Notable for the following components:      Result Value   RBC 3.36 (*)    Hemoglobin 10.0 (*)    HCT 32.3 (*)    All other components within normal limits  COMPREHENSIVE METABOLIC PANEL - Abnormal; Notable for the following components:   Chloride 112 (*)    Glucose, Bld 122 (*)    BUN 44 (*)    Creatinine, Ser 3.03 (*)    Calcium 8.4 (*)    GFR calc non Af Amer 18 (*)    GFR calc Af Amer 21 (*)    All other components within normal limits  CBG MONITORING, ED - Abnormal; Notable for the following components:   Glucose-Capillary 109 (*)    All other components within normal limits  I-STAT CHEM 8, ED - Abnormal;  Notable for the following components:   BUN 41 (*)    Creatinine, Ser 3.30 (*)    Glucose, Bld 111 (*)    Hemoglobin 9.5 (*)    HCT 28.0 (*)    All other components within normal limits  ETHANOL  PROTIME-INR  APTT  DIFFERENTIAL  RAPID URINE DRUG SCREEN, HOSP PERFORMED  URINALYSIS, ROUTINE W REFLEX MICROSCOPIC  I-STAT TROPONIN, ED    EKG EKG Interpretation  Date/Time:  Friday July 19 2018 09:29:11 EDT Ventricular Rate:  56 PR Interval:    QRS Duration: 98 QT Interval:  437 QTC Calculation: 422 R Axis:   73 Text Interpretation:  Sinus rhythm Left ventricular hypertrophy Confirmed by Virgel Manifold [phone]) on 07/19/2018 9:35:39 AM   Radiology Ct Head Code Stroke Wo Contrast  Result Date: 07/19/2018 CLINICAL DATA:  Code stroke.  82 year old male left-sided weakness. EXAM: CT HEAD WITHOUT CONTRAST TECHNIQUE: Contiguous axial images were obtained from the base of the skull through the vertex without intravenous contrast. COMPARISON:  Report of brain MRI 07/21/2011. FINDINGS: Brain: Patchy and confluent bilateral cerebral white matter hypodensity. Deep white matter capsule involvement and bilateral deep gray matter heterogeneity. Changes appear progressed since 2012. No acute intracranial hemorrhage identified. No midline shift, mass effect, or evidence of intracranial mass lesion. No ventriculomegaly. No cortical encephalomalacia identified. Vascular: Calcified atherosclerosis at the skull base. No suspicious intracranial vascular hyperdensity. Skull: Negative. Sinuses/Orbits: Prior bilateral paranasal sinus surgery with mild residual mucosal thickening. Tympanic cavities are clear. Other: No acute orbit or scalp soft tissue findings. ASPECTS [facility] Stroke Program Early CT Score) - Ganglionic level infarction (caudate, lentiform nuclei, internal capsule, insula, M1-M3 cortex): 7 - Supraganglionic infarction (M4-M6 cortex): 3 Total score (0-10 with 10 being normal): 10 IMPRESSION: 1. No  acute intracranial hemorrhage or acute cortically based infarct. ASPECTS is 10. 2. Progressed and widespread chronic white matter disease since 2012. 3. Study discussed by telephone with Dr. Virgel Manifold on 07/19/2018 at 09:28 . Electronically Signed   By: Genevie Ann M.D.   On: 07/19/2018 09:29    Procedures Procedures (including critical care time)  CRITICAL CARE Performed by: Virgel Manifold Total critical care time: 35 minutes Critical care time was exclusive of separately billable procedures and treating other patients. Critical care was necessary to treat or prevent imminent or life-threatening deterioration. Critical care was time spent personally by me on the following activities: development of treatment plan with patient and/or surrogate as well as nursing, discussions with consultants, evaluation of patient's response to treatment, examination of patient, obtaining history from patient or surrogate, ordering and performing treatments and interventions, ordering and review of laboratory studies, ordering and review  of radiographic studies, pulse oximetry and re-evaluation of patient's condition.  Medications Ordered in ED Medications  alteplase (ACTIVASE) 1 mg/mL infusion 50.2 mg (50.2 mg Intravenous New Bag/Given 07/19/18 1003)    Followed by  0.9 %  sodium chloride infusion (has no administration in time range)  nicardipine (CARDENE) 20mg  in 0.86% saline 280ml IV infusion (0.1 mg/ml) (5 mg/hr Intravenous Rate/Dose Change 07/19/18 1043)  labetalol (NORMODYNE,TRANDATE) injection 10 mg (10 mg Intravenous Given 07/19/18 0946)  labetalol (NORMODYNE,TRANDATE) injection 10 mg (10 mg Intravenous Given 07/19/18 0951)     Initial Impression / Assessment and Plan / ED Course  I have reviewed the triage vital signs and the nursing notes.  Pertinent labs & imaging results that were available during my care of the patient were reviewed by me and considered in my medical decision making (see chart for  details).     82 year old male with left upper extremity weakness.  Last known normal was 7 AM.  He was activated as a code stroke.  Evaluated by tele-neurology and recommended tPA. Received labetalol 10 mg x1 but remained hypertensive and HR dropping to 40s. He was subsequently started on cardene and received tPA. Discussed with Dr Leonel Ramsay, neurology, at [facility]. Transfer to ICU there. Pt/family updated on plan. He is now reporting that he is having tingling in L arm and strength is mildly improved.   Final Clinical Impressions(s) / ED Diagnoses   Final diagnoses:  Cerebrovascular accident (CVA), unspecified mechanism ([place])  Received intravenous tissue plasminogen activator (tPA) in emergency department    ED Discharge Orders    None          Virgel Manifold, MD 07/19/18 1055

## 2018-07-19 NOTE — ED Notes (Signed)
Patient to CT.

## 2018-07-19 NOTE — ED Notes (Signed)
Verbal consent to admin TPA per family and patient. Verified with Neurologist

## 2018-07-19 NOTE — Progress Notes (Signed)
CODE STROKE 0840 CALL TIME 0901 BEEPER TIME 0911 EXAM STARTED  0913 EXAM FINISHED 0916 EXAM COMPLETED IN EPIC [phone] [place] [facility] CALLED

## 2018-07-19 NOTE — ED Notes (Signed)
cardene titrated down to 5mg  due to drop in BP. Pt resting comfortably with family at bedside. Slight increasing in sensation and movement of left arm. Denies other symptoms.

## 2018-07-19 NOTE — Consult Note (Signed)
Neurology H&P  CC: Left-sided weakness  History is obtained from: Patient  HPI: Cameron Boyle is a 82 y.o. male who reports a history of hypertension without treatment who presents with left-sided weakness that started during a nap earlier today.  He woke up early this morning and was completely normal, and that he laid back down around 7 AM and woke up at 8 AM with left-sided weakness.  He went to any pain in the ED where he was evaluated by tele-neurology with an NIHSS of 3 he was given IV TPA.  He reports that his left-sided weakness markedly improved after IV TPA   LKW: 7 AM tpa given?: yes Modified Rankin Scale: 0-Completely asymptomatic and back to baseline post- stroke  ROS: A 14 point ROS was performed and is negative except as noted in the HPI.   Past Medical History:  Diagnosis Date  . Arthritis    left knee     History reviewed. No pertinent family history.   Social History:  reports that he has quit smoking. He has never used smokeless tobacco. He reports that he does not drink alcohol or use drugs.   Exam: Current vital signs: BP 133/77 (BP Location: Right Arm)   Pulse 60   Temp 98 F (36.7 C) (Oral)   Resp 16   Wt 55.8 kg   SpO2 99%   BMI 17.65 kg/m  Vital signs in last 24 hours: Temp:  [97.8 F (36.6 C)-98 F (36.7 C)] 98 F (36.7 C) (11/01 1305) Pulse Rate:  [44-90] 60 (11/01 1300) Resp:  [11-22] 16 (11/01 1300) BP: (133-215)/(64-89) 133/77 (11/01 1300) SpO2:  [93 %-100 %] 99 % (11/01 1300) Weight:  [55.8 kg] 55.8 kg (11/01 0937)  Physical Exam  Constitutional: Appears well-developed and well-nourished.  Psych: Affect appropriate to situation Eyes: No scleral injection HENT: He has mild bleeding from the corners of his mouth, no bleeding inside his mouth Head: Normocephalic.  Cardiovascular: Normal rate and regular rhythm.  Respiratory: Effort normal and breath sounds normal to anterior ascultation GI: Soft.  No distension. There is no  tenderness.  Skin: WDI  Neuro: Mental Status: Patient is awake, alert, oriented to person, place, month, year, and situation. Patient is able to give a clear and coherent history. No signs of aphasia or neglect Cranial Nerves: II: Visual Fields are full. Pupils are equal, round, and reactive to light.   III,IV, VI: EOMI without ptosis or diploplia.  V: Facial sensation is symmetric to temperature VII: Facial movement with mild left facial weakness VIII: hearing is intact to voice X: Uvula elevates symmetrically XI: Shoulder shrug is symmetric. XII: tongue is midline without atrophy or fasciculations.  Motor: Tone is normal. Bulk is normal. 5/5 strength was present in  Right arm and leg, he has 4+/5 strength of the left leg without drift, 3/5 strength of the left arm  sensory: Sensation is diminished in the left arm but intact in the face and leg  Cerebellar: FNF and HKS with mild intentional tremor in the right arm and legs, unable to perform in the left arm   I have reviewed labs in epic and the results pertinent to this consultation are: CMP- AKI, though his last creatinine was 5 years ago and so the actual acuity of this is on clear Mild normocytic anemia with a hemoglobin of 10  I have reviewed the images obtained: CT head-unremarkable  Primary Diagnosis:  Cerebral infarction due to unspecified artery   Secondary Diagnosis: Accelerated  hypertension(SBP > 180 or DBP > 11) & end organ damage), hypertensive emergency   Impression: 82 year old male with acute infarct status post TPA.  Though he does not get treated for it, he does endorse a history of hypertension which is the only risk factor I was able to elucidate.  He does not take any medications at baseline.  He will need further evaluation and therapy.  Recommendations: - HgbA1c, fasting lipid panel - MRI, MRA  of the brain without contrast - Frequent neuro checks - Echocardiogram - Carotid dopplers -  Prophylactic therapy-number 24 hours - Risk factor modification - Telemetry monitoring - PT consult, OT consult, Speech consult - Stroke team to follow  Roland Rack, MD [facility] [phone]  If 7pm- 7am, please page neurology on call as listed in [place]. 07/19/2018  1:41 PM

## 2018-07-19 NOTE — ED Notes (Signed)
Resting comfortably with NAD noted. Pt family at bedside.

## 2018-07-19 NOTE — ED Notes (Signed)
Pt bp too high for TPA at present time. Labetalol given. Waiting on decrease BP

## 2018-07-19 NOTE — Progress Notes (Signed)
  Echocardiogram 2D Echocardiogram has been performed.  Cameron Boyle 07/19/2018, 3:58 PM

## 2018-07-19 NOTE — ED Triage Notes (Addendum)
Patient brought in by EMS with weakness to left arm, dysarthria, and left facial droop starting at 0800 today. Last known well was 0700 this morning. Dr Wilson Singer at bedside. Patient states he laid back down at 0700 this morning and woke at 0800 with symptoms.

## 2018-07-19 NOTE — ED Notes (Signed)
Pt leaving with [facility] at this time. Pt remains in stable condition with no pain and arm holds at 90 degrees for 10seconds

## 2018-07-19 NOTE — ED Notes (Signed)
Code stroke called at this time per Dr Wilson Singer.

## 2018-07-19 NOTE — Consult Note (Signed)
[facility] TeleNeurology Consult Services   Date of Service:   07/19/2018 09:23:08  Impression:     .  Small Vessel Infarct     .  MCA Distribution  Comments: Non-contrast CT brain showed extensive small vessel ischemic white matter disease.  Mechanism of Stroke: Not Clear  Metrics: [facility] Notification Time: 07/19/2018 09:22:13 Arrival Time: 07/19/2018 09:02:00 Stamp Time: 07/19/2018 09:23:08 Time First Login Attempt: 07/19/2018 09:31:00 Video Start Time: 07/19/2018 09:31:00  Symptoms: Left side weakness NIHSS Start Assessment Time: 07/19/2018 09:37:00 tPA Verbal Order Time: 07/19/2018 09:52:00 Patient is a candidate for tPA. tPA CPOE Order Time: 07/19/2018 09:55:00 Needle Time: 07/19/2018 10:03:57 Weight Noted by Staff: 123 lbs Video End Time: 07/19/2018 10:07:35 Reason for tPA Delay: Delays related to Blood Pressure Management tPA Delay Notes: needed 2 doses of labetalol  CT head showed no acute hemorrhage or acute core infarct. CT head was reviewed.  ER Physician notified of the decision on thrombolytics management on 07/19/2018 09:48:00  Verbal Consent to tPA: I have explained to the Patient and Family the nature of the patient's condition, the use of tPA fibrinolytic agent, and the benefits to be reasonably expected compared with alternative approaches. I have discussed the likelihood of major risks or complications of this procedure including (if applicable) but not limited to loss of limb function, brain damage, paralysis, hemorrhage, infection, complications from transfusion of blood components, drug reactions, blood clots and loss of life. I have also indicated that with any procedure there is always the possibility of an unexpected complication. All questions were answered and Patient and Family express understanding of the treatment plan and consent to the treatment.  Our recommendations are outlined below.  Recommendations: IV  tPA recommended.  tPA bolus given Without Complication.   IV tPA Total Dose - 50.2 mg IV tPA Bolus Dose - 5.0 mg IV tPA Infusion Dose - 45.2 mg  Routine post tPA monitoring including neuro checks and blood pressure control during/after treatment Monitor blood pressure Check blood pressure and NIHSS every 15 min for 2 h, then every 30 min for 6 h, and finally every hour for 16 h.  Manage Blood Pressure per post tPA protocol.      .  Admission to ICU     .  CT brain 24 hours post tPA     .  NPO until swallowing screen performed and passed     .  No antiplatelet agents or anticoagulants (including heparin for DVT prophylaxis) in first 24 hours     .  No Foley catheter, nasogastric tube, arterial catheter or central venous catheter for 24 hr, unless absolutely necessary     .  Telemetry     .  Bedside swallow evaluation     .  HOB less than 30 degrees     .  Euglycemia     .  Avoid hyperthermia, PRN acetaminophen     .  DVT prophylaxis     .  Inpatient Neurology Consultation     .  Stroke evaluation as per inpatient neurology recommendations  Discussed with ED physician    ------------------------------------------------------------------------------  History of Present Illness: Patient is a 82 years old Male.  Patient was brought by EMS for symptoms of Left side weakness  last seen normal at 0700. He woke after a short nap at about 0800 with slurred speech and left arm weakness. EMS noted facial weak news and problem with gait. No h/o hBP, DM, CAD, a. fib., hyperlipidemia. On no  meds. Non-smoker. BP on arrival 231/87 which went to 17164 after labetalol 10 mg IV X2. Glucose 111. Platelet count normal.  CT head showed no acute hemorrhage or acute core infarct. CT head was reviewed.  Last seen normal was within 4.5 hours. There is no history of hemorrhagic complications or intracranial hemorrhage. There is no history of Recent Anticoagulants. There is no history of recent  major surgery. There is no history of recent stroke.  Examination: BP(171/64), Pulse(47) 1A: Level of Consciousness - Alert; keenly responsive + 0 1B: Ask Month and Age - Both Questions Right + 0 1C: Blink Eyes & Squeeze Hands - Performs Both Tasks + 0 2: Test Horizontal Extraocular Movements - Normal + 0 3: Test Visual Fields - No Visual Loss + 0 4: Test Facial Palsy (Use Grimace if Obtunded) - Normal symmetry + 0 5A: Test Left Arm Motor Drift - No Drift for 10 Seconds + 0 5B: Test Right Arm Motor Drift - Drift, hits bed + 2 6A: Test Left Leg Motor Drift - No Drift for 5 Seconds + 0 6B: Test Right Leg Motor Drift - No Drift for 5 Seconds + 0 7: Test Limb Ataxia (FNF/Heel-Shin) - No Ataxia + 0 8: Test Sensation - Mild-Moderate Loss: Less Sharp/More Dull + 1 9: Test Language/Aphasia - Normal; No aphasia + 0 10: Test Dysarthria - Normal + 0 11: Test Extinction/Inattention - No abnormality + 0  NIHSS Score: 3  Patient was informed the Neurology Consult would happen via TeleHealth consult by way of interactive audio and video telecommunications and consented to receiving care in this manner.  Due to the immediate potential for life-threatening deterioration due to underlying acute neurologic illness, I spent 35 minutes providing critical care. This time includes time for face to face visit via telemedicine, review of medical records, imaging studies and discussion of findings with providers, the patient and/or family.   Dr Cindie Laroche   [facility] [phone]

## 2018-07-19 NOTE — ED Notes (Addendum)
Report given to [facility] at this time. They are approximately 20 minutes away

## 2018-07-19 NOTE — ED Notes (Signed)
Patient transported to CT 

## 2018-07-20 ENCOUNTER — Inpatient Hospital Stay (HOSPITAL_COMMUNITY): Payer: Medicare PPO

## 2018-07-20 DIAGNOSIS — Z9282 Status post administration of tPA (rtPA) in a different facility within the last 24 hours prior to admission to current facility: Secondary | ICD-10-CM

## 2018-07-20 DIAGNOSIS — I10 Essential (primary) hypertension: Secondary | ICD-10-CM

## 2018-07-20 DIAGNOSIS — E785 Hyperlipidemia, unspecified: Secondary | ICD-10-CM

## 2018-07-20 DIAGNOSIS — I639 Cerebral infarction, unspecified: Secondary | ICD-10-CM

## 2018-07-20 DIAGNOSIS — I6340 Cerebral infarction due to embolism of unspecified cerebral artery: Secondary | ICD-10-CM

## 2018-07-20 DIAGNOSIS — N183 Chronic kidney disease, stage 3 (moderate): Secondary | ICD-10-CM

## 2018-07-20 DIAGNOSIS — D631 Anemia in chronic kidney disease: Secondary | ICD-10-CM

## 2018-07-20 LAB — LIPID PANEL
Cholesterol: 126 mg/dL (ref 0–200)
HDL: 44 mg/dL
LDL Cholesterol: 76 mg/dL (ref 0–99)
Total CHOL/HDL Ratio: 2.9 ratio
Triglycerides: 32 mg/dL
VLDL: 6 mg/dL (ref 0–40)

## 2018-07-20 LAB — HEMOGLOBIN A1C
Hgb A1c MFr Bld: 5.1 % (ref 4.8–5.6)
MEAN PLASMA GLUCOSE: 99.67 mg/dL

## 2018-07-20 LAB — MRSA PCR SCREENING: MRSA by PCR: NEGATIVE

## 2018-07-20 MED ORDER — ASPIRIN 325 MG PO TABS
325.0000 mg | ORAL_TABLET | Freq: Once | ORAL | Status: AC
  Administered 2018-07-20: 325 mg via ORAL
  Filled 2018-07-20: qty 1

## 2018-07-20 MED ORDER — LABETALOL HCL 5 MG/ML IV SOLN: 10.0000 mg | INTRAVENOUS | Status: DC | PRN

## 2018-07-20 MED ORDER — PANTOPRAZOLE SODIUM 40 MG PO TBEC
40.0000 mg | DELAYED_RELEASE_TABLET | Freq: Every day | ORAL | Status: DC
  Administered 2018-07-20 – 2018-07-22 (×3): 40 mg via ORAL
  Filled 2018-07-20 (×3): qty 1

## 2018-07-20 MED ORDER — AMLODIPINE BESYLATE 10 MG PO TABS
10.0000 mg | ORAL_TABLET | Freq: Every day | ORAL | Status: DC
  Administered 2018-07-20 – 2018-07-22 (×3): 10 mg via ORAL
  Filled 2018-07-20 (×3): qty 1

## 2018-07-20 MED ORDER — ATORVASTATIN CALCIUM 10 MG PO TABS
20.0000 mg | ORAL_TABLET | Freq: Every day | ORAL | Status: DC
  Administered 2018-07-20 – 2018-07-21 (×2): 20 mg via ORAL
  Filled 2018-07-20 (×2): qty 2
  Filled 2018-07-20: qty 1

## 2018-07-20 MED ORDER — CLOPIDOGREL BISULFATE 75 MG PO TABS
75.0000 mg | ORAL_TABLET | Freq: Every day | ORAL | Status: DC
  Administered 2018-07-20 – 2018-07-22 (×3): 75 mg via ORAL
  Filled 2018-07-20 (×3): qty 1

## 2018-07-20 MED ORDER — PNEUMOCOCCAL VAC POLYVALENT 25 MCG/0.5ML IJ INJ: 0.5000 mL | INJECTION | INTRAMUSCULAR | Status: DC | PRN

## 2018-07-20 MED ORDER — ASPIRIN EC 81 MG PO TBEC
81.0000 mg | DELAYED_RELEASE_TABLET | Freq: Every day | ORAL | Status: DC
  Administered 2018-07-21 – 2018-07-22 (×2): 81 mg via ORAL
  Filled 2018-07-20 (×2): qty 1

## 2018-07-20 NOTE — Evaluation (Signed)
Physical Therapy Evaluation Patient Details Name: Cameron Boyle MRN: [id number] DOB: Aug 07, 1936 Today's Date: 07/20/2018   History of Present Illness  Patient is an 82 y.o. male with PMH: HTN without treatment, who was admitted to hospital following sudden onset of left sided weakness. He was given TPA in ED. MR brain revealed acute/subacute right MCA infarcts.      Clinical Impression  Pt admitted with above diagnosis. Pt currently with functional limitations due to the deficits listed below (see PT Problem List). PTA, pt independent living at home with wife. Today pt ambulating unit without AD. Limited by LUE weakness unable to lift arm over head. Discussed role of  Ongoing therapy for patient and recovering from CVA.  Pt will benefit from skilled PT to increase their independence and safety with mobility to allow discharge to the venue listed below.       Follow Up Recommendations Outpatient PT(OP NEURO )     Equipment Recommendations  (TBD)    Recommendations for Other Services       Precautions / Restrictions Precautions Precautions: Fall Restrictions Weight Bearing Restrictions: No      Mobility  Bed Mobility Overal bed mobility: Modified Independent                Transfers Overall transfer level: Modified independent               General transfer comment: increased time and effort  Ambulation/Gait Ambulation/Gait assistance: Min guard;Min assist Gait Distance (Feet): 60 Feet Assistive device: None Gait Pattern/deviations: Step-through pattern Gait velocity: decreased   General Gait Details: pt with L knee pain from old injury, states he walks with limp like this for some time. no overt LOB but midly unsteady. family reports hes walking at baseline  [employer] (Stroke Patients Only)       Balance                                             Pertinent Vitals/Pain Pain  Assessment: No/denies pain    Home Living Family/patient expects to be discharged to:: Private residence Living Arrangements: Spouse/significant other Available Help at Discharge: Family;Available 24 hours/day Type of Home: House Home Access: Stairs to enter Entrance Stairs-Rails: Left Entrance Stairs-Number of Steps: 2 Home Layout: One level Home Equipment: Walker - 2 wheels;Cane - quad;Cane - single point;Walker - 4 wheels;Hospital bed      Prior Function Level of Independence: Independent               Hand Dominance        Extremity/Trunk Assessment   Upper Extremity Assessment Upper Extremity Assessment: Defer to OT evaluation    Lower Extremity Assessment Lower Extremity Assessment: Overall WFL for tasks assessed       Communication      Cognition Arousal/Alertness: Awake/alert                                            General Comments      Exercises     Assessment/Plan    PT Assessment Patient needs continued PT services  PT Problem List Decreased strength;Decreased activity tolerance;Decreased range of motion;Decreased balance;Decreased  mobility;Decreased coordination;Decreased cognition       PT Treatment Interventions DME instruction;Functional mobility training;Gait training;Stair training;Therapeutic exercise;Therapeutic activities    PT Goals (Current goals can be found in the Care Plan section)  Acute Rehab PT Goals Patient Stated Goal: get home and get stronger in LUE PT Goal Formulation: With patient Time For Goal Achievement: 08/03/18 Potential to Achieve Goals: Good    Frequency Min 3X/week   Barriers to discharge        Co-evaluation               AM-PAC PT "6 Clicks" Daily Activity  Outcome Measure Difficulty turning over in bed (including adjusting bedclothes, sheets and blankets)?: A Lot Difficulty moving from lying on back to sitting on the side of the bed? : A Lot Difficulty sitting down on  and standing up from a chair with arms (e.g., wheelchair, bedside commode, etc,.)?: A Lot Help needed moving to and from a bed to chair (including a wheelchair)?: A Little Help needed walking in hospital room?: A Little Help needed climbing 3-5 steps with a railing? : A Lot 6 Click Score: 14    End of Session Equipment Utilized During Treatment: Gait belt Activity Tolerance: Patient tolerated treatment well Patient left: in bed;with call bell/phone within reach   PT Visit Diagnosis: Unsteadiness on feet (R26.81);Hemiplegia and hemiparesis Hemiplegia - Right/Left: Left Hemiplegia - dominant/non-dominant: Dominant Hemiplegia - caused by: Cerebral infarction    Time: 1700-1730 PT Time Calculation (min) (ACUTE ONLY): 30 min   Charges:   PT Evaluation $PT Eval Low Complexity: 1 Low PT Treatments $Gait Training: 8-22 mins       Reinaldo Berber, PT, DPT Acute Rehabilitation Services Pager: [phone] Office: [phone]    Reinaldo Berber 07/20/2018, 6:25 PM

## 2018-07-20 NOTE — Progress Notes (Signed)
VASCULAR LAB PRELIMINARY  PRELIMINARY  PRELIMINARY  PRELIMINARY  Bilateral lower extremity venous duplex completed.    Preliminary report:  There is no obvious evidence of DVT or SVT noted in the bilateral lower extremities.   Vivica Dobosz, RVT 07/20/2018, 4:43 PM

## 2018-07-20 NOTE — Progress Notes (Addendum)
STROKE TEAM PROGRESS NOTE   SUBJECTIVE (INTERVAL HISTORY) His wife and RN are at the bedside.  Pt had MRI and MRA done showed right cortical infarcts. He denies any heart palpitation or racing heart. Denies any smoking or alcohol use.    OBJECTIVE Vitals:   07/20/18 0815 07/20/18 0830 07/20/18 0845 07/20/18 0900  BP: (!) 148/65 132/71 134/67 120/63  Pulse: 71 70 (!) 56 (!) 56  Resp: 16 15 (!) 23 14  Temp:      TempSrc:      SpO2: 100% 98% 98% 99%  Weight:        CBC:  Recent Labs  Lab 07/19/18 0916 07/19/18 0934  WBC 6.2  --   NEUTROABS 3.9  --   HGB 10.0* 9.5*  HCT 32.3* 28.0*  MCV 96.1  --   PLT 161  --     Basic Metabolic Panel:  Recent Labs  Lab 07/19/18 0916 07/19/18 0934  NA 142 142  K 4.4 4.4  CL 112* 107  CO2 23  --   GLUCOSE 122* 111*  BUN 44* 41*  CREATININE 3.03* 3.30*  CALCIUM 8.4*  --     Lipid Panel:     Component Value Date/Time   CHOL 126 07/20/2018 0345   TRIG 32 07/20/2018 0345   HDL 44 07/20/2018 0345   CHOLHDL 2.9 07/20/2018 0345   VLDL 6 07/20/2018 0345   LDLCALC 76 07/20/2018 0345   HgbA1c:  Lab Results  Component Value Date   HGBA1C 5.1 07/20/2018   Urine Drug Screen:     Component Value Date/Time   LABOPIA NONE DETECTED 07/19/2018 1054   COCAINSCRNUR NONE DETECTED 07/19/2018 1054   LABBENZ NONE DETECTED 07/19/2018 1054   AMPHETMU NONE DETECTED 07/19/2018 1054   THCU NONE DETECTED 07/19/2018 1054   LABBARB NONE DETECTED 07/19/2018 1054    Alcohol Level     Component Value Date/Time   ETH <10 07/19/2018 0916    IMAGING   Ct Head Code Stroke Wo Contrast 07/19/2018 IMPRESSION:  1. No acute intracranial hemorrhage or acute cortically based infarct. ASPECTS is 10.  2. Progressed and widespread chronic white matter disease since 2012. 3.    MRI / MRA  1. Acute/subacute right MCA territory infarcts extending of the right frontal convexity. 2. Remote lacunar infarct of the right internal capsule. 3. Diffuse white  matter disease likely reflects the sequela of chronic microvascular ischemia. 4. 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   Transthoracic Echocardiogram  07/19/2018 Impressions: - Normal LV size with mild LV hypertrophy. EF 60-65%. Normal RV   size and systolic function. No significant valvular   abnormalities. Mild pulmonary hypertension.   Bilateral Carotid Dopplers - pending    PHYSICAL EXAM  Temp:  [98.1 F (36.7 C)-99.3 F (37.4 C)] 98.5 F (36.9 C) (11/02 1155) Pulse Rate:  [53-95] 61 (11/02 1430) Resp:  [10-24] 17 (11/02 1430) BP: (113-191)/(44-131) 141/73 (11/02 1511) SpO2:  [94 %-100 %] 100 % (11/02 1430)  General - Well nourished, well developed, in no apparent distress.  Ophthalmologic - fundi not visualized due to noncooperation.  Cardiovascular - Regular rate and rhythm.  Mental Status -  Level of arousal and orientation to time, place, and person were intact. Language including expression, naming, repetition, comprehension was assessed and found intact. Fund of Knowledge was assessed and was intact.  Cranial Nerves II - XII - II - Visual field intact OU. III, IV, VI -  Extraocular movements intact. V - Facial sensation intact bilaterally. VII - mild left facial droop. VIII - Hearing & vestibular intact bilaterally. X - Palate elevates symmetrically, mild dysarthria. XI - Chin turning & shoulder shrug intact bilaterally. XII - Tongue protrusion intact.  Motor Strength - The patient's strength was normal in all extremities except left UE 4/5 proximal and distally and pronator drift was present  Bulk was normal and fasciculations were absent.   Motor Tone - Muscle tone was assessed at the neck and appendages and was normal.  Reflexes - The patient's reflexes were symmetrical in all extremities and he had no pathological reflexes.  Sensory - Light touch, temperature/pinprick were assessed and were  symmetrical.    Coordination - The patient had normal movements in the right hand with no ataxia or dysmetria. But left FTN ataxic proportional to his weakness. Tremor was absent.  Gait and Station - deferred.    ASSESSMENT/PLAN Mr. MCCORMICK MACON is a 82 y.o. male with history of hypertension presenting with left sided weakness.  IV tPA at [facility] per tele-neurology  Stroke - right MCA high convexity cortical infarcts, source unclear  Resultant  LUE weakness, left facial droop  CT head - No acute intracranial hemorrhage or acute cortically based infarct.   MRI head - right frontal patchy cortical infarcts  MRA head - right V4 stenosis  Carotid Doppler - pending  2D Echo  - EF 60 - 65%. No cardiac source of emboli identified.   Will need to consider TEE and loop for further embolic work up unless CUS showed right ICA high grade stenosis  LE venous doppler pending  UDS - negative  LDL - 76  HgbA1c - 5.1  VTE prophylaxis - SCDs  Diet  - Heart healthy with thin liquids.  aspirin 81 mg daily prior to admission, now on ASA and plavix for DAPT  Patient counseled to be compliant with his antithrombotic medications  Ongoing aggressive stroke risk factor management  Therapy recommendations:  pending  Disposition:  Pending  Hypertension  Stable on the high side  Off cardene . will add amlodipine . Long-term BP goal normotensive  Hyperlipidemia  Lipid lowering medication PTA:  none  LDL 76, goal < 70  Current lipid lowering medication: add low dose Lipitor 20  Continue statin at discharge  Other Stroke Risk Factors  Advanced age  Former cigarette smoker - quit  Other Active Problems  Anemia likely due to CKD - Hb 10.0 -> 9.5 - monitor - check iron panel  CKD III - Creatinine - 3.03 -> 3.30 - monitor   Hospital day # 1  This patient is critically ill due to stroke s/p tPA, HTN, CKD and anemia and at significant risk of neurological worsening, death form  recurrent stroke, hemorrhagic conversion, hypertensive emergency, renal failure. This patient's care requires constant monitoring of vital signs, hemodynamics, respiratory and cardiac monitoring, review of multiple databases, neurological assessment, discussion with family, other specialists and medical decision making of high complexity. I spent 35 minutes of neurocritical care time in the care of this patient.  Rosalin Hawking, MD PhD Stroke Neurology 07/20/2018 3:32 PM     To contact Stroke Continuity provider, please refer to [email or web address] After hours, contact General Neurology

## 2018-07-20 NOTE — Plan of Care (Signed)
  Problem: Education: Goal: Knowledge of disease or condition will improve Outcome: Progressing Goal: Knowledge of secondary prevention will improve Outcome: Progressing Goal: Knowledge of patient specific risk factors addressed and post discharge goals established will improve Outcome: Progressing Goal: Individualized Educational Video(s) Outcome: Progressing   Problem: Coping: Goal: Will verbalize positive feelings about self Outcome: Progressing Goal: Will identify appropriate support needs Outcome: Progressing   Problem: Health Behavior/Discharge Planning: Goal: Ability to manage health-related needs will improve Outcome: Progressing   Problem: Self-Care: Goal: Ability to participate in self-care as condition permits will improve Outcome: Progressing Goal: Verbalization of feelings and concerns over difficulty with self-care will improve Outcome: Progressing   Problem: Nutrition: Goal: Risk of aspiration will decrease Outcome: Progressing Goal: Dietary intake will improve Outcome: Progressing   Problem: Ischemic Stroke/TIA Tissue Perfusion: Goal: Complications of ischemic stroke/TIA will be minimized Outcome: Progressing

## 2018-07-20 NOTE — Evaluation (Signed)
Speech Language Pathology Evaluation Patient Details Name: Cameron Boyle MRN: [id number] DOB: 05-26-1936 Today's Date: 07/20/2018 Time: 1120-1140 SLP Time Calculation (min) (ACUTE ONLY): 20 min  Problem List:  Patient Active Problem List   Diagnosis Date Noted  . CVA (cerebral vascular accident) ([place]) 07/19/2018  . Stroke (cerebrum) ([place]) 07/19/2018   Past Medical History:  Past Medical History:  Diagnosis Date  . Arthritis    left knee   Past Surgical History:  Past Surgical History:  Procedure Laterality Date  . APPENDECTOMY    . left knee surgery    . TONSILLECTOMY     HPI:  Patient is an 82 y.o. male with PMH: HTN without treatment, who was admitted to hospital following sudden onset of left sided weakness. He was given TPA in ED. MR brain revealed acute/subacute right MCA infarcts.   Assessment / Plan / Recommendation Clinical Impression  Patient presents WFL-WNL for cognition, receptive and expressive language and speech intelligibility as per this assessment. He did present with a very mild facial weakness and decreased ROM on right side but it was barely noticeable and did not impact his overall speech or swallowing function at all. Patient demonstrated good awareness to his current functional limitations as a result from this CVA.     SLP Assessment  SLP Recommendation/Assessment: Patient does not need any further Speech Lanaguage Pathology Services SLP Visit Diagnosis: Cognitive communication deficit (R41.841)    Follow Up Recommendations  None    Frequency and Duration   N/A        SLP Evaluation Cognition  Overall Cognitive Status: No family/caregiver present to determine baseline cognitive functioning Arousal/Alertness: Awake/alert Orientation Level: Oriented X4 Memory: Appears intact Awareness: Appears intact Problem Solving: Appears intact Executive Function: Reasoning;Decision Making Reasoning: Appears intact Decision Making: Appears  intact Safety/Judgment: Appears intact       Comprehension  Auditory Comprehension Overall Auditory Comprehension: Appears within functional limits for tasks assessed    Expression Expression Primary Mode of Expression: Verbal Verbal Expression Overall Verbal Expression: Appears within functional limits for tasks assessed   Oral / Motor  Oral Motor/Sensory Function Overall Oral Motor/Sensory Function: Mild impairment Facial ROM: Reduced right Facial Symmetry: Abnormal symmetry right;Other (Comment)(very mild) Facial Strength: Within Functional Limits Facial Sensation: Within Functional Limits Lingual ROM: Within Functional Limits Lingual Symmetry: Within Functional Limits Lingual Strength: Within Functional Limits Lingual Sensation: Within Functional Limits Velum: Within Functional Limits Mandible: Within Functional Limits Motor Speech Overall Motor Speech: Appears within functional limits for tasks assessed Respiration: Within functional limits Phonation: Normal Resonance: Within functional limits Articulation: Within functional limitis Intelligibility: Intelligible Motor Planning: Witnin functional limits   [place], MA, CCC-SLP 07/20/18 1:28 PM

## 2018-07-20 NOTE — Progress Notes (Signed)
VASCULAR LAB PRELIMINARY  PRELIMINARY  PRELIMINARY  PRELIMINARY  Carotid duplex completed.    Preliminary report:  1-39% ICA stenosis.  Vertebral artery flow is antegrade.  Ethaniel Garfield, RVT 07/20/2018, 4:42 PM

## 2018-07-21 DIAGNOSIS — I63411 Cerebral infarction due to embolism of right middle cerebral artery: Principal | ICD-10-CM

## 2018-07-21 LAB — IRON AND TIBC
Iron: 22 ug/dL — ABNORMAL LOW (ref 45–182)
SATURATION RATIOS: 10 % — AB (ref 17.9–39.5)
TIBC: 214 ug/dL — ABNORMAL LOW (ref 250–450)
UIBC: 192 ug/dL

## 2018-07-21 LAB — BASIC METABOLIC PANEL
Anion gap: 5 (ref 5–15)
BUN: 49 mg/dL — ABNORMAL HIGH (ref 8–23)
CO2: 21 mmol/L — ABNORMAL LOW (ref 22–32)
Calcium: 8.3 mg/dL — ABNORMAL LOW (ref 8.9–10.3)
Chloride: 112 mmol/L — ABNORMAL HIGH (ref 98–111)
Creatinine, Ser: 3.13 mg/dL — ABNORMAL HIGH (ref 0.61–1.24)
GFR calc Af Amer: 20 mL/min — ABNORMAL LOW (ref >60)
GFR calc non Af Amer: 17 mL/min — ABNORMAL LOW (ref >60)
Glucose, Bld: 106 mg/dL — ABNORMAL HIGH (ref 70–99)
Potassium: 4.5 mmol/L (ref 3.5–5.1)
Sodium: 138 mmol/L (ref 135–145)

## 2018-07-21 LAB — CBC
HCT: 32.5 % — ABNORMAL LOW (ref 39.0–52.0)
Hemoglobin: 10.5 g/dL — ABNORMAL LOW (ref 13.0–17.0)
MCH: 30.3 pg (ref 26.0–34.0)
MCHC: 32.3 g/dL (ref 30.0–36.0)
MCV: 93.7 fL (ref 80.0–100.0)
Platelets: 165 10*3/uL (ref 150–400)
RBC: 3.47 MIL/uL — ABNORMAL LOW (ref 4.22–5.81)
RDW: 12.5 % (ref 11.5–15.5)
WBC: 9.5 10*3/uL (ref 4.0–10.5)
nRBC: 0 % (ref 0.0–0.2)

## 2018-07-21 LAB — FERRITIN: Ferritin: 196 ng/mL (ref 24–336)

## 2018-07-21 MED ORDER — HYDRALAZINE HCL 25 MG PO TABS
25.0000 mg | ORAL_TABLET | Freq: Three times a day (TID) | ORAL | Status: DC
  Administered 2018-07-21 – 2018-07-22 (×3): 25 mg via ORAL
  Filled 2018-07-21 (×4): qty 1

## 2018-07-21 NOTE — H&P (View-Only) (Signed)
   [facility] has been requested to perform a transesophageal echocardiogram on this patient for stroke.  After careful review of history and examination, the risks and benefits of transesophageal echocardiogram have been explained including risks of esophageal damage, perforation (1:10,000 risk), bleeding, pharyngeal hematoma as well as other potential complications associated with conscious sedation including aspiration, arrhythmia, respiratory failure and death. Alternatives to treatment were discussed, questions were answered. Patient is willing to proceed. Endoscopy is closed on the weekends so I cannot officially schedule this, but patient's name was submitted to [place] to arrange tomorrow along with other requests that came in this weekend. I do not have the current ability to determine if this can be accommodated on schedule tomorrow but have made patient NPO after midnight. Formal orders will follow from our team once patient is officially scheduled. Neuro also sent request for loop to [employer] which would also be pending for tomorrow.  Simran Mannis PA-C

## 2018-07-21 NOTE — Evaluation (Addendum)
Occupational Therapy Evaluation Patient Details Name: Cameron Boyle MRN: [id number] DOB: [birth year] Today's Date: 07/21/2018    History of Present Illness Patient is an 82 y.o. male with PMH: HTN without treatment, who was admitted to hospital following sudden onset of left sided weakness. He was given TPA in ED. MR brain revealed acute/subacute right MCA infarcts.    Clinical Impression   Pt admitted with above. He demonstrates the below listed deficits and will benefit from continued OT to maximize safety and independence with BADLs.  Pt presents to OT with Lt UE weakness, ataxia, and decreased Eleele, as well as impaired balance and decreased activity tolerance.   He requires min A for ADLs due to need for assist with fasteners, and supervision to min A with functional mobility.  He does loose his balance when he attempts to ambulate while carrying items in either Lt or Rt UE.    He lives with wife at home, and was fully independent PTA including driving.   Discussed no driving initially, and he agrees, as his reaction time is likely slower.  He reports wife can provide transportation to OP therapies, and would prefer OP to [facility].   Will follow acutely.       Follow Up Recommendations  Outpatient Neuro OT;Supervision - Intermittent    Equipment Recommendations  None recommended by OT    Recommendations for Other Services       Precautions / Restrictions Precautions Precautions: Fall Precaution Comments: Pt reports previous injury to Lt knee which caused instability       Mobility Bed Mobility Overal bed mobility: Modified Independent                Transfers Overall transfer level: Modified independent                    Balance Overall balance assessment: Needs assistance Sitting-balance support: Feet supported Sitting balance-Leahy Scale: Good     Standing balance support: During functional activity Standing balance-Leahy Scale: Fair Standing balance comment:  Pt with occasional balance loss during functional tasks                            ADL either performed or assessed with clinical judgement   ADL Overall ADL's : Needs assistance/impaired Eating/Feeding: Modified independent;Sitting;Bed level   Grooming: Wash/dry hands;Wash/dry face;Oral care;Brushing hair;Min guard;Standing Grooming Details (indicate cue type and reason): using Rt UE as dominant hand  Upper Body Bathing: Set up;Supervision/ safety;Sitting   Lower Body Bathing: Min guard;Sit to/from stand   Upper Body Dressing : Minimal assistance;Sitting Upper Body Dressing Details (indicate cue type and reason): assist for fasteners  Lower Body Dressing: Minimal assistance;Sit to/from stand Lower Body Dressing Details (indicate cue type and reason): assist for fasteners.  Pt able to don/doff socks and sliippers with increased time and effort  Toilet Transfer: Min guard;Ambulation;Comfort height toilet   Toileting- Clothing Manipulation and Hygiene: Min guard;Sit to/from stand       Functional mobility during ADLs: Min guard;Minimal assistance       Vision Baseline Vision/History: Wears glasses Wears Glasses: At all times Patient Visual Report: No change from baseline Vision Assessment?: Yes Eye Alignment: Within Functional Limits Ocular Range of Motion: Within Functional Limits Alignment/Gaze Preference: Within Defined Limits Tracking/Visual Pursuits: Able to track stimulus in all quads without difficulty Saccades: Within functional limits Visual Fields: No apparent deficits     Perception Perception Perception Tested?: Yes  Praxis Praxis Praxis tested?: Within functional limits    Pertinent Vitals/Pain Pain Assessment: No/denies pain     Hand Dominance Left   Extremity/Trunk Assessment Upper Extremity Assessment Upper Extremity Assessment: LUE deficits/detail LUE Deficits / Details: Lt UE ataxia noted;  Brunnsrom stage IV, beginning stage V ; hand  Brunnstrom stage V  LUE Sensation: decreased proprioception(assessed through function ) LUE Coordination: decreased gross motor   Lower Extremity Assessment Lower Extremity Assessment: Defer to PT evaluation   Cervical / Trunk Assessment Cervical / Trunk Assessment: Other exceptions(mild weakness Lt trunk when challenged with reaching tasks )   Communication Communication Communication: No difficulties   Cognition Arousal/Alertness: Awake/alert Behavior During Therapy: WFL for tasks assessed/performed Overall Cognitive Status: Within Functional Limits for tasks assessed                                     General Comments  Pt with LOB requiring min A to correct when pt attempts to carry cup of water in Lt or Rt hand     Exercises Exercises: Other exercises Other Exercises Other Exercises: Worked on functional reach of Lt UE with focus on faciliation of normal movement patterns - pt able to reach for and retrieve styrofoam cup up to ~115* flexion and scaption with occasional min facilitation.   Pt demonstrates significant difficulty carrying water in Lt Hand - spills water frequently    Shoulder Instructions      Home Living Family/patient expects to be discharged to:: Private residence Living Arrangements: Spouse/significant other Available Help at Discharge: Family;Available 24 hours/day Type of Home: House Home Access: Stairs to enter [employer] of Steps: 2 Entrance Stairs-Rails: Left Home Layout: One level     Bathroom Shower/Tub: Occupational psychologist: Standard Bathroom Accessibility: Yes   Home Equipment: Environmental consultant - 2 wheels;Cane - quad;Cane - single point;Walker - 4 wheels;Hospital bed;Shower seat      Lives With: Spouse    Prior Functioning/Environment Level of Independence: Independent        Comments: Pt drives         OT Problem List: Decreased strength;Decreased range of motion;Decreased activity  tolerance;Impaired balance (sitting and/or standing);Decreased coordination;Impaired UE functional use      OT Treatment/Interventions: Self-care/ADL training;Neuromuscular education;Therapeutic activities;Balance training;Patient/family education    OT Goals(Current goals can be found in the care plan section) Acute Rehab OT Goals Patient Stated Goal: to use Lt hand  OT Goal Formulation: With patient Time For Goal Achievement: 08/04/18 Potential to Achieve Goals: Good ADL Goals Additional ADL Goal #1: Pt will be independent with HEP for Lt UE  OT Frequency: Min 2X/week   Barriers to D/C:            Co-evaluation              AM-PAC PT "6 Clicks" Daily Activity     Outcome Measure Help from another person eating meals?: None Help from another person taking care of personal grooming?: A Little Help from another person toileting, which includes using toliet, bedpan, or urinal?: A Little Help from another person bathing (including washing, rinsing, drying)?: A Little Help from another person to put on and taking off regular upper body clothing?: A Little Help from another person to put on and taking off regular lower body clothing?: A Little 6 Click Score: 19   End of Session    Activity Tolerance: Patient tolerated treatment  well Patient left: in chair;with call bell/phone within reach  OT Visit Diagnosis: Unsteadiness on feet (R26.81);Hemiplegia and hemiparesis Hemiplegia - Right/Left: Left Hemiplegia - dominant/non-dominant: Dominant Hemiplegia - caused by: Cerebral infarction                Time: 9692-4932 OT Time Calculation (min): 31 min Charges:  OT General Charges $OT Visit: 1 Visit OT Evaluation $OT Eval Moderate Complexity: 1 Mod OT Treatments $Neuromuscular Re-education: 8-22 mins  Lucille Passy, OTR/L Acute Rehabilitation Services Pager [phone] Office [phone]   Lucille Passy M 07/21/2018, 10:26 AM

## 2018-07-21 NOTE — Progress Notes (Signed)
   [facility] has been requested to perform a transesophageal echocardiogram on this patient for stroke.  After careful review of history and examination, the risks and benefits of transesophageal echocardiogram have been explained including risks of esophageal damage, perforation (1:10,000 risk), bleeding, pharyngeal hematoma as well as other potential complications associated with conscious sedation including aspiration, arrhythmia, respiratory failure and death. Alternatives to treatment were discussed, questions were answered. Patient is willing to proceed. Endoscopy is closed on the weekends so I cannot officially schedule this, but patient's name was submitted to [place] to arrange tomorrow along with other requests that came in this weekend. I do not have the current ability to determine if this can be accommodated on schedule tomorrow but have made patient NPO after midnight. Formal orders will follow from our team once patient is officially scheduled. Neuro also sent request for loop to [facility] which would also be pending for tomorrow.  Dayna Dunn PA-C

## 2018-07-21 NOTE — Progress Notes (Signed)
STROKE TEAM PROGRESS NOTE   SUBJECTIVE (INTERVAL HISTORY) His wife is at the bedside.  Pt sitting in chair, not in acute distress. Worked with PT/OT today and recommend outpt PT/OT. LUE weakness continues to improve.   OBJECTIVE Vitals:   07/20/18 1958 07/21/18 0016 07/21/18 0441 07/21/18 0832  BP: (!) 151/67 (!) 179/81 (!) 155/63 (!) 177/87  Pulse: 71 80 61 67  Resp: 18 18 18 16   Temp: 98.3 F (36.8 C) 97.8 F (36.6 C) 98.2 F (36.8 C) 97.7 F (36.5 C)  TempSrc: Oral Oral Oral Oral  SpO2: 100% 100% 98% 100%  Weight:        CBC:  Recent Labs  Lab 07/19/18 0916 07/19/18 0934 07/21/18 0514  WBC 6.2  --  9.5  NEUTROABS 3.9  --   --   HGB 10.0* 9.5* 10.5*  HCT 32.3* 28.0* 32.5*  MCV 96.1  --  93.7  PLT 161  --  683    Basic Metabolic Panel:  Recent Labs  Lab 07/19/18 0916 07/19/18 0934 07/21/18 0514  NA 142 142 138  K 4.4 4.4 4.5  CL 112* 107 112*  CO2 23  --  21*  GLUCOSE 122* 111* 106*  BUN 44* 41* 49*  CREATININE 3.03* 3.30* 3.13*  CALCIUM 8.4*  --  8.3*    Lipid Panel:     Component Value Date/Time   CHOL 126 07/20/2018 0345   TRIG 32 07/20/2018 0345   HDL 44 07/20/2018 0345   CHOLHDL 2.9 07/20/2018 0345   VLDL 6 07/20/2018 0345   LDLCALC 76 07/20/2018 0345   HgbA1c:  Lab Results  Component Value Date   HGBA1C 5.1 07/20/2018   Urine Drug Screen:     Component Value Date/Time   LABOPIA NONE DETECTED 07/19/2018 1054   COCAINSCRNUR NONE DETECTED 07/19/2018 1054   LABBENZ NONE DETECTED 07/19/2018 1054   AMPHETMU NONE DETECTED 07/19/2018 1054   THCU NONE DETECTED 07/19/2018 1054   LABBARB NONE DETECTED 07/19/2018 1054    Alcohol Level     Component Value Date/Time   ETH <10 07/19/2018 0916    IMAGING   Ct Head Code Stroke Wo Contrast 07/19/2018 IMPRESSION:  1. No acute intracranial hemorrhage or acute cortically based infarct. ASPECTS is 10.  2. Progressed and widespread chronic white matter disease since 2012. 3.    MRI / MRA   1. Acute/subacute right MCA territory infarcts extending of the right frontal convexity. 2. Remote lacunar infarct of the right internal capsule. 3. Diffuse white matter disease likely reflects the sequela of chronic microvascular ischemia. 4. 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   Transthoracic Echocardiogram  07/19/2018 Impressions: - Normal LV size with mild LV hypertrophy. EF 60-65%. Normal RV   size and systolic function. No significant valvular   abnormalities. Mild pulmonary hypertension.   Bilateral Carotid Dopplers  07/20/2018 Preliminary report:  1-39% ICA stenosis.  Vertebral artery flow is antegrade.   LE Dopplers 07/20/2018 Preliminary report:  There is no obvious evidence of DVT or SVT noted in the bilateral lower extremities.   TEE pending   PHYSICAL EXAM  Temp:  [97.7 F (36.5 C)-98.5 F (36.9 C)] 97.7 F (36.5 C) (11/03 0832) Pulse Rate:  [50-80] 67 (11/03 0832) Resp:  [14-24] 16 (11/03 0832) BP: (137-184)/(61-87) 177/87 (11/03 0832) SpO2:  [96 %-100 %] 100 % (11/03 0832)  General - Well nourished, well developed, in no apparent distress.  Ophthalmologic - fundi not  visualized due to noncooperation.  Cardiovascular - Regular rate and rhythm.  Mental Status -  Level of arousal and orientation to time, place, and person were intact. Language including expression, naming, repetition, comprehension was assessed and found intact. Fund of Knowledge was assessed and was intact.  Cranial Nerves II - XII - II - Visual field intact OU. III, IV, VI - Extraocular movements intact. V - Facial sensation intact bilaterally. VII - mild left facial droop. VIII - Hearing & vestibular intact bilaterally. X - Palate elevates symmetrically, mild dysarthria. XI - Chin turning & shoulder shrug intact bilaterally. XII - Tongue protrusion intact.  Motor Strength - The patient's strength was normal in all  extremities except left UE 4+/5 proximal and distally and pronator drift was present  Bulk was normal and fasciculations were absent.   Motor Tone - Muscle tone was assessed at the neck and appendages and was normal.  Reflexes - The patient's reflexes were symmetrical in all extremities and he had no pathological reflexes.  Sensory - Light touch, temperature/pinprick were assessed and were symmetrical.    Coordination - The patient had normal movements in the right hand with no ataxia or dysmetria. But left FTN ataxic proportional to his weakness. Tremor was absent.  Gait and Station - deferred.    ASSESSMENT/PLAN Cameron Boyle is a 82 y.o. male with history of hypertension presenting with left sided weakness.  IV tPA at [facility] per tele-neurology  Stroke - right MCA high convexity cortical infarcts, source unclear  Resultant  LUE weakness, left facial droop  CT head - No acute intracranial hemorrhage or acute cortically based infarct.   MRI head - right frontal patchy cortical infarcts  MRA head - right V4 stenosis  Carotid Doppler unremarkable   2D Echo  - EF 60 - 65%. No cardiac source of emboli identified.   TEE / Loop tentatively scheduled for Monday  LE venous doppler - Preliminary report:  There is no obvious evidence of DVT or SVT noted.  UDS - negative  LDL - 76  HgbA1c - 5.1  VTE prophylaxis - SCDs  Diet  - Heart healthy with thin liquids.  aspirin 81 mg daily prior to admission, now on ASA and plavix for DAPT  Patient counseled to be compliant with his antithrombotic medications  Ongoing aggressive stroke risk factor management  Therapy recommendations:  Outpt OT and PT recommended  Disposition:  Pending  Hypertension  Still on the high side - likely due to CKD  Off cardene . On amlodipine 10 . Will add hydralazine 25mg  tid . Long-term BP goal normotensive  Hyperlipidemia  Lipid lowering medication PTA:  none  LDL 76, goal < 70  Current  lipid lowering medication: add low dose Lipitor 20  Continue statin at discharge  Likely CKD  New diagnosis this time  Creatinine - 3.03 -> 3.30 -> 3.13   Continue to monitor  Anemia  Likely due to CKD  Hb 10.0 -> 9.5-> 10.5  Iron level low, but ferritin level WNL  Continue to follow up with nephrology  Other Stroke Risk Factors  Advanced age  Former cigarette smoker - quit years ago  Other [facility] day # 2   Rosalin Hawking, MD PhD Stroke Neurology 07/21/2018 2:48 PM        To contact Stroke Continuity provider, please refer to [email or web address] After hours, contact General Neurology

## 2018-07-22 ENCOUNTER — Encounter (HOSPITAL_COMMUNITY)
Admission: EM | Disposition: A | Discharge status: Home / Self Care | Payer: Self-pay | Source: Home / Self Care | Attending: Neurology

## 2018-07-22 ENCOUNTER — Inpatient Hospital Stay (HOSPITAL_COMMUNITY): Payer: Medicare PPO

## 2018-07-22 ENCOUNTER — Encounter (HOSPITAL_COMMUNITY): Payer: Self-pay | Admitting: *Deleted

## 2018-07-22 DIAGNOSIS — I10 Essential (primary) hypertension: Secondary | ICD-10-CM

## 2018-07-22 DIAGNOSIS — I340 Nonrheumatic mitral (valve) insufficiency: Secondary | ICD-10-CM

## 2018-07-22 DIAGNOSIS — I6389 Other cerebral infarction: Secondary | ICD-10-CM

## 2018-07-22 DIAGNOSIS — N189 Chronic kidney disease, unspecified: Secondary | ICD-10-CM

## 2018-07-22 DIAGNOSIS — E785 Hyperlipidemia, unspecified: Secondary | ICD-10-CM

## 2018-07-22 DIAGNOSIS — D631 Anemia in chronic kidney disease: Secondary | ICD-10-CM

## 2018-07-22 HISTORY — PX: LOOP RECORDER INSERTION: EP1214

## 2018-07-22 HISTORY — PX: TEE WITHOUT CARDIOVERSION: SHX5443

## 2018-07-22 LAB — CBC
HCT: 32.3 % — ABNORMAL LOW (ref 39.0–52.0)
Hemoglobin: 10.4 g/dL — ABNORMAL LOW (ref 13.0–17.0)
MCH: 30 pg (ref 26.0–34.0)
MCHC: 32.2 g/dL (ref 30.0–36.0)
MCV: 93.1 fL (ref 80.0–100.0)
Platelets: 174 10*3/uL (ref 150–400)
RBC: 3.47 MIL/uL — ABNORMAL LOW (ref 4.22–5.81)
RDW: 12.6 % (ref 11.5–15.5)
WBC: 8.4 10*3/uL (ref 4.0–10.5)
nRBC: 0 % (ref 0.0–0.2)

## 2018-07-22 LAB — BASIC METABOLIC PANEL
Anion gap: 4 — ABNORMAL LOW (ref 5–15)
BUN: 48 mg/dL — ABNORMAL HIGH (ref 8–23)
CO2: 21 mmol/L — ABNORMAL LOW (ref 22–32)
Calcium: 8.3 mg/dL — ABNORMAL LOW (ref 8.9–10.3)
Chloride: 114 mmol/L — ABNORMAL HIGH (ref 98–111)
Creatinine, Ser: 2.98 mg/dL — ABNORMAL HIGH (ref 0.61–1.24)
GFR calc Af Amer: 21 mL/min — ABNORMAL LOW (ref >60)
GFR calc non Af Amer: 18 mL/min — ABNORMAL LOW (ref >60)
Glucose, Bld: 103 mg/dL — ABNORMAL HIGH (ref 70–99)
Potassium: 4.3 mmol/L (ref 3.5–5.1)
Sodium: 139 mmol/L (ref 135–145)

## 2018-07-22 SURGERY — ECHOCARDIOGRAM, TRANSESOPHAGEAL
Anesthesia: Moderate Sedation

## 2018-07-22 SURGERY — LOOP RECORDER INSERTION

## 2018-07-22 MED ORDER — ONDANSETRON HCL 4 MG/2ML IJ SOLN: 4.0000 mg | Freq: Four times a day (QID) | INTRAMUSCULAR | Status: DC | PRN

## 2018-07-22 MED ORDER — FENTANYL CITRATE (PF) 100 MCG/2ML IJ SOLN
INTRAMUSCULAR | Status: DC | PRN
  Administered 2018-07-22: 25 ug via INTRAVENOUS

## 2018-07-22 MED ORDER — HEPARIN SODIUM (PORCINE) 5000 UNIT/ML IJ SOLN: 5000.0000 [IU] | Freq: Three times a day (TID) | INTRAMUSCULAR | Status: DC

## 2018-07-22 MED ORDER — AMLODIPINE BESYLATE 10 MG PO TABS: 10.0000 mg | ORAL_TABLET | Freq: Every day | ORAL | 2 refills | Status: AC

## 2018-07-22 MED ORDER — CLOPIDOGREL BISULFATE 75 MG PO TABS: 75.0000 mg | ORAL_TABLET | Freq: Every day | ORAL | 2 refills | Status: AC

## 2018-07-22 MED ORDER — LIDOCAINE-EPINEPHRINE 1 %-1:100000 IJ SOLN
INTRAMUSCULAR | Status: AC
  Filled 2018-07-22: qty 1

## 2018-07-22 MED ORDER — LIDOCAINE-EPINEPHRINE 1 %-1:100000 IJ SOLN
INTRAMUSCULAR | Status: DC | PRN
  Administered 2018-07-22: 10 mL

## 2018-07-22 MED ORDER — FENTANYL CITRATE (PF) 100 MCG/2ML IJ SOLN
INTRAMUSCULAR | Status: AC
  Filled 2018-07-22: qty 2

## 2018-07-22 MED ORDER — ATORVASTATIN CALCIUM 20 MG PO TABS: 20.0000 mg | ORAL_TABLET | Freq: Every day | ORAL | 2 refills | Status: AC

## 2018-07-22 MED ORDER — BUTAMBEN-TETRACAINE-BENZOCAINE 2-2-14 % EX AERO
INHALATION_SPRAY | CUTANEOUS | Status: DC | PRN
  Administered 2018-07-22: 2 via TOPICAL

## 2018-07-22 MED ORDER — HYDRALAZINE HCL 25 MG PO TABS: 25.0000 mg | ORAL_TABLET | Freq: Three times a day (TID) | ORAL | 2 refills | Status: AC

## 2018-07-22 MED ORDER — ASPIRIN 81 MG PO TBEC: 81.0000 mg | DELAYED_RELEASE_TABLET | Freq: Every day | ORAL | 0 refills | Status: AC

## 2018-07-22 MED ORDER — MIDAZOLAM HCL 10 MG/2ML IJ SOLN
INTRAMUSCULAR | Status: DC | PRN
  Administered 2018-07-22 (×2): 1 mg via INTRAVENOUS

## 2018-07-22 MED ORDER — SODIUM CHLORIDE 0.9 % IV SOLN
INTRAVENOUS | Status: DC
  Administered 2018-07-22: 13:00:00 via INTRAVENOUS

## 2018-07-22 MED ORDER — ACETAMINOPHEN 325 MG PO TABS: 325.0000 mg | ORAL_TABLET | ORAL | Status: DC | PRN

## 2018-07-22 MED ORDER — MIDAZOLAM HCL 5 MG/ML IJ SOLN
INTRAMUSCULAR | Status: AC
  Filled 2018-07-22: qty 2

## 2018-07-22 SURGICAL SUPPLY — 2 items
LOOP REVEAL LINQSYS (Prosthesis & Implant Heart) ×3 IMPLANT
PACK LOOP INSERTION (CUSTOM PROCEDURE TRAY) ×3 IMPLANT

## 2018-07-22 NOTE — Progress Notes (Signed)
Physical Therapy Treatment Patient Details Name: Cameron Boyle MRN: [id number] DOB: January 01, 1936 Today's Date: 07/22/2018    History of Present Illness Patient is an 82 y.o. male with PMH: HTN without treatment, who was admitted to hospital following sudden onset of left sided weakness. He was given TPA in ED. MR brain revealed acute/subacute right MCA infarcts.     PT Comments    Patient progressing slowly towards PT goals. Continues to demonstrate weakness and ataxia in LUE. Practiced functional reach and use of LUE (grabbing cups, opening things, passing to PT) while sitting in chair. Worked on balance and gait training. Pt with multiple LOB during gait and stair training requiring assist to prevent fall. Most difficulty with dual tasking, head turns and stairs. Encouraged continued use of LUE for all functional tasks. Plan for TEE today. Will follow.   Follow Up Recommendations  Outpatient PT(OP neuro)     Equipment Recommendations  None recommended by PT    Recommendations for Other Services       Precautions / Restrictions Precautions Precautions: Fall Precaution Comments: Pt reports previous injury to Lt knee which caused instability  Restrictions Weight Bearing Restrictions: No    Mobility  Bed Mobility               General bed mobility comments: Sitting in chair upon PT arrival.   Transfers Overall transfer level: Needs assistance Equipment used: None Transfers: Sit to/from Stand Sit to Stand: Supervision         General transfer comment: Supervision for safety, holding onto bed for support in standing initially. Cues to push through LUE to stand.   Ambulation/Gait Ambulation/Gait assistance: Min guard;Mod assist Gait Distance (Feet): 120 Feet Assistive device: None Gait Pattern/deviations: Step-through pattern;Decreased stride length;Decreased dorsiflexion - left;Scissoring;Staggering left;Staggering right;Antalgic Gait velocity: decreased Gait  velocity interpretation: 1.31 - 2.62 ft/sec, indicative of limited community ambulator General Gait Details: Slow, unsteady gait with scissoring pattern noted with head turns and LOB x2 requiring assist to prevent fall; decreased foot clearance LLE esp when tired. HR ranged from 112-120 bpm.   Stairs Stairs: Yes Stairs assistance: Min assist Stair Management: Two rails;Step to pattern Number of Stairs: 2(+ 3 steps x3 bouts) General stair comments: VCs for sequencing however pt not able to understand commands. LOB x2 on stairs as well.   Wheelchair Mobility    Modified Rankin (Stroke Patients Only) Modified Rankin (Stroke Patients Only) Pre-Morbid Rankin Score: Slight disability Modified Rankin: Moderately severe disability     Balance Overall balance assessment: Needs assistance Sitting-balance support: Feet supported;No upper extremity supported Sitting balance-Leahy Scale: Good     Standing balance support: During functional activity Standing balance-Leahy Scale: Fair Standing balance comment: Pt with occasional balance loss during functional tasks      Tandem Stance - Right Leg: 30(sway with EO/EC) Tandem Stance - Left Leg: 20(with EO/EC with LOB when EC) Rhomberg - Eyes Opened: 30(sway present) Rhomberg - Eyes Closed: 30(worsened sway) High level balance activites: Head turns;Direction changes;Sudden stops High Level Balance Comments: LOB and scissoring gait with head turns.             Cognition Arousal/Alertness: Awake/alert Behavior During Therapy: WFL for tasks assessed/performed Overall Cognitive Status: Impaired/Different from baseline Area of Impairment: Following commands;Safety/judgement                       Following Commands: Follows multi-step commands inconsistently Safety/Judgement: Decreased awareness of safety     General Comments: "My balance  is pretty good" after 2 LOB requiring assist to prevent fall.      Exercises Other  Exercises Other Exercises: Worked on functional reach and use of LUE- able to reach for and retrieve styrofoam cup (ataxia present) as well as practice opening tooth paste and shampoo containers, placing in LUE and passing back to therapist.     General Comments General comments (skin integrity, edema, etc.): Son and spouse present.       Pertinent Vitals/Pain Pain Assessment: No/denies pain    Home Living                      Prior Function            PT Goals (current goals can now be found in the care plan section) Progress towards PT goals: Progressing toward goals    Frequency    Min 3X/week      PT Plan Current plan remains appropriate    Co-evaluation              AM-PAC PT "6 Clicks" Daily Activity  Outcome Measure  Difficulty turning over in bed (including adjusting bedclothes, sheets and blankets)?: None Difficulty moving from lying on back to sitting on the side of the bed? : None Difficulty sitting down on and standing up from a chair with arms (e.g., wheelchair, bedside commode, etc,.)?: A Little Help needed moving to and from a bed to chair (including a wheelchair)?: A Little Help needed walking in hospital room?: A Little Help needed climbing 3-5 steps with a railing? : A Lot 6 Click Score: 19    End of Session Equipment Utilized During Treatment: Gait belt Activity Tolerance: Patient tolerated treatment well Patient left: in chair;with call bell/phone within reach;with family/visitor present Nurse Communication: Mobility status PT Visit Diagnosis: Unsteadiness on feet (R26.81);Hemiplegia and hemiparesis Hemiplegia - Right/Left: Left Hemiplegia - dominant/non-dominant: Dominant Hemiplegia - caused by: Cerebral infarction     Time: 0786-7544 PT Time Calculation (min) (ACUTE ONLY): 22 min  Charges:  $Neuromuscular Re-education: 8-22 mins                     Wray Kearns, PT, DPT Acute Rehabilitation Services Pager  [phone] Office [place] 07/22/2018, 12:37 PM

## 2018-07-22 NOTE — Progress Notes (Signed)
  Echocardiogram Echocardiogram Transesophageal has been performed.  Cameron Boyle 07/22/2018, 2:12 PM

## 2018-07-22 NOTE — Progress Notes (Signed)
Patient ready for discharge to home; discharge instructions given and reviewed; Rx's given; patient assisted to dress; patient discharged out; loop recorder site with small spot of blood; no active bleeding; wife and son accompanying patient to home; all equipment given to patient post loop recorder.

## 2018-07-22 NOTE — Progress Notes (Signed)
Occupational Therapy Treatment Patient Details Name: Cameron Boyle MRN: [id number] DOB: 03-03-1936 Today's Date: 07/22/2018    History of present illness Patient is an 82 y.o. male with PMH: HTN without treatment, who was admitted to hospital following sudden onset of left sided weakness. He was given TPA in ED. MR brain revealed acute/subacute right MCA infarcts.    OT comments  Pt able to perform ADLs with supervision.  Worked on facilitation of functional reach with Lt UE - he demonstrates improved isolation and control of movement today, but ataxia still present, and he demonstrates decreased eccentric control of shoulder flexion and abduction.  He was able to carry item in Lt UE today while ambulating with no LOB.    Follow Up Recommendations  Outpatient OT;Supervision - Intermittent    Equipment Recommendations  None recommended by OT    Recommendations for Other Services      Precautions / Restrictions Precautions Precautions: Fall Precaution Comments: Pt reports previous injury to Lt knee which caused instability  Restrictions Weight Bearing Restrictions: No       Mobility Bed Mobility               General bed mobility comments: Sitting in chair upon PT arrival.   Transfers Overall transfer level: Needs assistance Equipment used: None Transfers: Sit to/from Stand;Stand Pivot Transfers Sit to Stand: Supervision Stand pivot transfers: Supervision       General transfer comment: Supervision for safety, holding onto bed for support in standing initially. Cues to push through LUE to stand.     Balance Overall balance assessment: Needs assistance Sitting-balance support: Feet supported;No upper extremity supported Sitting balance-Leahy Scale: Good     Standing balance support: During functional activity Standing balance-Leahy Scale: Fair Standing balance comment: Pt with occasional balance loss during functional tasks      Tandem Stance - Right Leg:  30(sway with EO/EC) Tandem Stance - Left Leg: 20(with EO/EC with LOB when EC) [place] - Eyes Opened: 30(sway present) [place]berg - Eyes Closed: 30(worsened sway) High level balance activites: Head turns;Direction changes;Sudden stops High Level Balance Comments: LOB and scissoring gait with head turns.            ADL either performed or assessed with clinical judgement   ADL                                               Vision       Perception     Praxis      Cognition Arousal/Alertness: Awake/alert Behavior During Therapy: WFL for tasks assessed/performed Overall Cognitive Status: Impaired/Different from baseline Area of Impairment: Following commands;Safety/judgement                       Following Commands: Follows multi-step commands inconsistently Safety/Judgement: Decreased awareness of safety;Decreased awareness of deficits     General Comments: Pt told OT that PT said he did really well with his balance         Exercises Other Exercises Other Exercises: worked on reaching all planes with Lt UE.  Pt now demonstrates isolated movement to ~110* shoulder flexion and scaption, but demonstrates decreased eccentric control of Lt UE.  He is able to carry empty water pitcher, keeping it upright, and no LOB     Shoulder Instructions       General Comments Son  and spouse present.     Pertinent Vitals/ Pain       Pain Assessment: No/denies pain  Home Living                                          Prior Functioning/Environment              Frequency  Min 2X/week        Progress Toward Goals  OT Goals(current goals can now be found in the care plan section)  Progress towards OT goals: Progressing toward goals     Plan Discharge plan remains appropriate    Co-evaluation                 AM-PAC PT "6 Clicks" Daily Activity     Outcome Measure   Help from another person eating meals?:  None Help from another person taking care of personal grooming?: A Little Help from another person toileting, which includes using toliet, bedpan, or urinal?: A Little Help from another person bathing (including washing, rinsing, drying)?: A Little Help from another person to put on and taking off regular upper body clothing?: A Little Help from another person to put on and taking off regular lower body clothing?: A Little 6 Click Score: 19    End of Session    OT Visit Diagnosis: Unsteadiness on feet (R26.81);Hemiplegia and hemiparesis Hemiplegia - Right/Left: Left Hemiplegia - dominant/non-dominant: Dominant Hemiplegia - caused by: Cerebral infarction   Activity Tolerance Patient tolerated treatment well   Patient Left in chair;with call bell/phone within reach;with family/visitor present   Nurse Communication          Time: 3582-5189 OT Time Calculation (min): 22 min  Charges: OT General Charges $OT Visit: 1 Visit OT Treatments $Neuromuscular Re-education: 8-22 mins  Lucille Passy, OTR/L [place] Pager [phone] Office [phone]    Lucille Passy M 07/22/2018, 2:48 PM

## 2018-07-22 NOTE — Care Management Note (Signed)
Case Management Note  Patient Details  Name: Cameron Boyle MRN: [id number] Date of Birth: [birth year]  Subjective/Objective:   Pt admitted with CVA. He is from home with his spouse.                  Action/Plan: CM consulted for outpatient therapy. CM met with the patient and his spouse. Pt will not have transportation to [facility] for rehab. Wife is able to get him to [facility] for rehab and prefer he go there. Orders in Epic and information on the AVS.  Family to provide transport home when pt is medically ready.   Expected Discharge Date:                  Expected Discharge Plan:  OP Rehab  In-House Referral:     Discharge planning Services  CM Consult  Post Acute Care Choice:    Choice offered to:     DME Arranged:    DME Agency:     HH Arranged:    [place] Agency:     Status of Service:  Completed, signed off  If discussed at [employer] of Stay Meetings, dates discussed:    Additional Comments:  Pollie Friar, RN 07/22/2018, 12:27 PM

## 2018-07-22 NOTE — Care Management Important Message (Signed)
Important Message  Patient Details  Name: Cameron Boyle MRN: [id number] Date of Birth: 1936/01/24   Medicare Important Message Given:  Yes    Orbie Pyo 07/22/2018, 3:42 PM

## 2018-07-22 NOTE — Discharge Summary (Addendum)
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 Arthritis    left knee   Past Surgical History:  Procedure Laterality Date  . APPENDECTOMY    . left knee surgery    . TEE WITHOUT CARDIOVERSION N/A 07/22/2018   Procedure: TRANSESOPHAGEAL ECHOCARDIOGRAM (TEE);  Surgeon: Dorothy Spark, MD;  Location: [facility] ENDOSCOPY;  Service: Cardiovascular;  Laterality: N/A;  . TONSILLECTOMY      Allergies as of 07/22/2018   No Known Allergies     Medication List    TAKE these medications   amLODipine 10 MG tablet Commonly known as:  NORVASC Take 1 tablet (10 mg total) by mouth daily. Start taking on:  07/23/2018   aspirin 81 MG EC tablet Take 1 tablet (81 mg total) by mouth daily for 21 days. Stop in 21 days and take plavix alone Start taking on:  07/23/2018 What changed:    how much to take  when to take this  additional instructions   atorvastatin 20 MG tablet Commonly known as:  LIPITOR Take 1 tablet (20 mg total) by mouth daily at 6 PM.   clopidogrel 75 MG tablet Commonly known as:  PLAVIX Take 1 tablet (75 mg total) by mouth daily. Start taking on:  07/23/2018   hydrALAZINE 25 MG tablet Commonly known as:  APRESOLINE Take 1 tablet (25 mg total) by mouth every 8 (eight) hours.   HYDROcodone-acetaminophen 5-325 MG tablet Commonly known as:  NORCO/VICODIN 1/2 to 1 tablet by mouth every 4 hours prn pain   MENS  MULTIPLUS PO Take 1 tablet by mouth daily.       LABORATORY STUDIES CBC    Component Value Date/Time   WBC 8.4 07/22/2018 0555   RBC 3.47 (L) 07/22/2018 0555   HGB 10.4 (L) 07/22/2018 0555   HCT 32.3 (L) 07/22/2018 0555   PLT 174 07/22/2018 0555   MCV 93.1 07/22/2018 0555   MCH 30.0 07/22/2018 0555   MCHC 32.2 07/22/2018 0555   RDW 12.6 07/22/2018 0555   LYMPHSABS 1.4 07/19/2018 0916   MONOABS 0.4 07/19/2018 0916   EOSABS 0.4 07/19/2018 0916   BASOSABS 0.1 07/19/2018 0916   CMP    Component Value Date/Time   NA 139 07/22/2018 0555   K 4.3 07/22/2018 0555   CL 114 (H) 07/22/2018 0555   CO2 21 (L) 07/22/2018 0555   GLUCOSE 103 (H) 07/22/2018 0555   BUN 48 (H) 07/22/2018 0555   CREATININE 2.98 (H) 07/22/2018 0555   CALCIUM 8.3 (L) 07/22/2018 0555   PROT 6.9 07/19/2018 0916   ALBUMIN 3.5 07/19/2018 0916   AST 21 07/19/2018 0916   ALT 13 07/19/2018 0916   ALKPHOS 66 07/19/2018 0916   BILITOT 0.5 07/19/2018 0916   GFRNONAA 18 (L) 07/22/2018 0555   GFRAA 21 (L) 07/22/2018 0555   COAGS Lab Results  Component Value Date   INR 1.18 07/19/2018  Lipid Panel    Component Value Date/Time   CHOL 126 07/20/2018 0345   TRIG 32 07/20/2018 0345   HDL 44 07/20/2018 0345   CHOLHDL 2.9 07/20/2018 0345   VLDL 6 07/20/2018 0345   LDLCALC 76 07/20/2018 0345   HgbA1C  Lab Results  Component Value Date   HGBA1C 5.1 07/20/2018   Urinalysis    Component Value Date/Time   COLORURINE YELLOW 07/19/2018 1054   APPEARANCEUR CLEAR 07/19/2018 1054   LABSPEC 1.008 07/19/2018 1054   PHURINE 7.0 07/19/2018 1054   GLUCOSEU 50 (A) 07/19/2018 1054   HGBUR NEGATIVE 07/19/2018 1054   BILIRUBINUR NEGATIVE 07/19/2018 1054   KETONESUR NEGATIVE 07/19/2018 1054   PROTEINUR 100 (A) 07/19/2018 1054   NITRITE NEGATIVE 07/19/2018 1054   LEUKOCYTESUR NEGATIVE 07/19/2018 1054   Urine Drug Screen     Component Value Date/Time   LABOPIA NONE DETECTED 07/19/2018 1054   COCAINSCRNUR NONE  DETECTED 07/19/2018 1054   LABBENZ NONE DETECTED 07/19/2018 1054   AMPHETMU NONE DETECTED 07/19/2018 1054   THCU NONE DETECTED 07/19/2018 1054   LABBARB NONE DETECTED 07/19/2018 1054    Alcohol Level    Component Value Date/Time   ETH <10 07/19/2018 0916     SIGNIFICANT DIAGNOSTIC STUDIES Ct Head Code Stroke Wo Contrast 07/19/2018 IMPRESSION:  1. No acute intracranial hemorrhage or acute cortically based infarct. ASPECTS is 10.  2. Progressed and widespread chronic white matter disease since 2012. 3.   MRI / MRA  1. Acute/subacute right MCA territory infarcts extending of the right frontal convexity. 2. Remote lacunar infarct of the right internal capsule. 3. Diffuse white matter disease likely reflects the sequela of chronic microvascular ischemia. 4. 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  Transthoracic Echocardiogram  07/19/2018 Impressions: - Normal LV size with mild LV hypertrophy. EF 60-65%. Normal RVsize and systolic function. No significant valvularabnormalities. Mild pulmonary hypertension.  Bilateral Carotid Dopplers  07/20/2018 Preliminary report:1-39% ICA stenosis. Vertebral artery flow is antegrade.  LE Dopplers 07/20/2018 Preliminary report:There is no obvious evidence of DVT or SVT noted in the bilateral lower extremities.  TEE  No intracardiac source of embolism, no PFO, negative bubble study.      HISTORY OF PRESENT ILLNESS Cameron Boyle is a 82 y.o. male who reports a history of hypertension without treatment who presents with left-sided weakness that started during a nap earlier today.  He woke up early this morning and was completely normal, and that he laid back down around 7 AM and woke up at 8 AM with left-sided weakness.  LKW 0700 on 07/19/2018. He went to [facility] ED where he was evaluated by tele-neurology with an NIHSS of 3 and was given IV TPA.  He reports that his left-sided weakness  markedly improved after IV TPA. He was transferred to [facility] for further stroke evaluation and treatment. Modified Rankin Scale: 0-Completely asymptomatic and back to baseline post- stroke   HOSPITAL COURSE Cameron Boyle is a 82 y.o. male with history of hypertension presenting with left sided weakness.  IV tPA at [facility] per tele-neurology  Stroke - right MCA high convexity cortical infarcts s/p IV tPA, embolic, source unclear  Resultant  LUE weakness, left facial droop  CT head - No acute intracranial hemorrhage or acute cortically based infarct.  MRI head - right frontal patchy cortical infarcts  MRA head - right V4 stenosis  Carotid Doppler unremarkable   2D Echo  - EF  60 - 65%. No cardiac source of emboli identified.   TEE No intracardiac source of embolism, no PFO, negative bubble study.   Implantable Loop recorder placed by EP 07/22/2018. family educated  LE venous doppler -no evidence of DVT or SVT   UDS - negative  LDL - 76  HgbA1c - 5.1  aspirin 81 mg daily prior to admission, now on ASA and plavix for DAPT. Given mild stroke, recommend aspirin 81 mg and plavix 75 mg daily x 3 weeks, then Plavix alone.   Therapy recommendations:  Outpt OT and PT   Disposition:  Home w/ wife and family  Hypertension  BP 120-150s  Treated with cardene post tPA  On amlodipine 10  Added hydralazine 25mg  tid  Long-term BP goal normotensive  Hyperlipidemia  Lipid lowering medication PTA:  none  LDL 76, goal < 70  Current lipid lowering medication: add low dose Lipitor 20  Continue statin at discharge  Likely CKD  New diagnosis this time  Creatinine - 3.03 -> 3.30 -> 3.13 ->2.98  Will have him f/u with PCP in 1 week - ? Nephrology referral  Anemia  Likely due to CKD  Hb 10.0 -> 9.5-> 10.4  Iron level low, but ferritin level WNL  ? follow up with nephrology. Defer to PCP  Other Stroke Risk Factors  Advanced age  Former cigarette smoker -  quit years ago   DISCHARGE EXAM Blood pressure (!) 120/56, pulse 89, temperature 97.9 F (36.6 C), temperature source Oral, resp. rate 20, height 5\' 10"  (1.778 m), weight 55.8 kg, SpO2 100 %. General - Well nourished, well developed, in no apparent distress.  Cardiovascular - Regular rate and rhythm.  Mental Status -  Level of arousal and orientation to time, place, and person were intact. Language including expression, naming, repetition, comprehension was assessed and found intact. Fund of Knowledge was assessed and was intact.  Cranial Nerves II - XII - II - Visual field intact OU. III, IV, VI - Extraocular movements intact. V - Facial sensation intact bilaterally. VII - mild left facial droop. VIII - Hearing & vestibular intact bilaterally. X - Palate elevates symmetrically, mild dysarthria. XI - Chin turning & shoulder shrug intact bilaterally. XII - Tongue protrusion intact.  Motor Strength - The patient's strength was normal in all extremities except left UE 4+/5 proximal and 3+/5 distally and pronator drift was present  Bulk was normal and fasciculations were absent.   Motor Tone - Muscle tone was assessed at the neck and appendages and was normal.  Reflexes - The patient's reflexes were symmetrical in all extremities and he had no pathological reflexes.  Sensory - Light touch, temperature/pinprick were assessed and were symmetrical.    Coordination - The patient had normal movements in the right hand with no ataxia or dysmetria. But left FTN ataxic proportional to his weakness. Tremor was absent.  Gait and Station - deferred.   Discharge Diet   Heart healthy thin liquids  DISCHARGE PLAN  Disposition:  Home with wife and family  aspirin 81 mg daily and clopidogrel 75 mg daily for secondary stroke prevention. X 3 weeks then plavix alone  Ongoing risk factor control by Primary Care Physician at time of discharge  Follow-up [name], MD (Inactive)  in 1 week - CRF  Follow up cariology - loop check 08/01/2018 at 2:30p.  Follow-up in [facility] in 4 weeks, office to schedule an appointment.   45 minutes were spent preparing discharge.  Burnetta Sabin, MSN, APRN, ANVP-BC, AGPCNP-BC Advanced Practice Stroke Nurse [place] for Schedule & Pager information 07/22/2018 4:36 PM    ATTENDING NOTE: I reviewed above note and agree with the assessment and plan. Pt was seen and examined.   Patient wife and son at bedside.  Patient sitting in chair, eager to go home.  Had a TEE unremarkable and loop recorder placed.  Continue aspirin 81 and Plavix 75 DAPT for 3 weeks and then Plavix alone.  Continue low-dose Lipitor.  BP stable on amlodipine and low-dose hydralazine.  He needed close follow-up with PCP for BP control and also for CKD monitoring.  Stroke risk factor modification.  PT/OT recommend outpatient PT/OT.  Ready for discharge.  Will follow with stroke clinic at [facility] in about 4 weeks.  Rosalin Hawking, MD PhD Stroke Neurology 07/22/2018 6:31 PM

## 2018-07-22 NOTE — CV Procedure (Addendum)
     Transesophageal Echocardiogram Note  ELYON ZOLL [id number] [birth year]  Procedure: Transesophageal Echocardiogram Indications: Stroke  Procedure Details Consent: Obtained Time Out: Verified patient identification, verified procedure, site/side was marked, verified correct patient position, special equipment/implants available, Radiology Safety Procedures followed,  medications/allergies/relevent history reviewed, required imaging and test results available.  Performed  Medications: During this procedure the patient is administered a total of Versed 2 mg and Fentanyl 25 mcg to achieve and maintain moderate conscious sedation.  The patient's heart rate, blood pressure, and oxygen saturation are monitored continuously during the procedure. The period of conscious sedation is 30 minutes, of which I was present face-to-face 100% of this time.  No intracardiac source of embolism, no PFO, negative bubble study.   Complications: No apparent complications Patient did tolerate procedure well.  Ena Dawley, MD, [facility] 07/22/2018, 1:41 PM

## 2018-07-22 NOTE — Interval H&P Note (Signed)
History and Physical Interval Note:  07/22/2018 1:05 PM  Cameron Boyle  has presented today for surgery, with the diagnosis of stroke  The various methods of treatment have been discussed with the patient and family. After consideration of risks, benefits and other options for treatment, the patient has consented to  Procedure(s): TRANSESOPHAGEAL ECHOCARDIOGRAM (TEE) (N/A) as a surgical intervention .  The patient's history has been reviewed, patient examined, no change in status, stable for surgery.  I have reviewed the patient's chart and labs.  Questions were answered to the patient's satisfaction.     Ena Dawley

## 2018-07-22 NOTE — Consult Note (Addendum)
ELECTROPHYSIOLOGY CONSULT NOTE  Patient ID: Cameron Boyle MRN: [id number], DOB/AGE: Mar 10, 1936   Admit date: 07/19/2018 Date of Consult: 07/22/2018  Primary Physician: Cameron Donna, MD (Inactive) Primary Cardiologist: new to [facility] Reason for Consultation: Cryptogenic stroke; recommendations regarding Implantable Loop Recorder  History of Present Illness EP has been asked to evaluate Cameron Boyle for placement of an implantable loop recorder to monitor for atrial fibrillation by Dr Cameron Boyle.  The patient was admitted on 07/19/2018 with acute onset left arm and left leg weakness.  They first developed symptoms while after awakening morning of admission.  He went to [facility] and received tPA and was transferred to [facility].  Imaging demonstrated right MCA cortical infarcts.  He has undergone workup for stroke including echocardiogram and carotid dopplers.  The patient has been monitored on telemetry which has demonstrated sinus rhythm with no arrhythmias.  Inpatient stroke work-up is to be completed with a TEE.   Echocardiogram this admission demonstrated EF 83-38%, grade 1 diastolic dysfunction, LA 34.  Lab work is reviewed.  Prior to admission, the patient denies chest pain, shortness of breath, dizziness, palpitations, or syncope.  They are recovering from their stroke with plans to return home at discharge.    Past Medical History:  Diagnosis Date  . Arthritis    left knee     Surgical History:  Past Surgical History:  Procedure Laterality Date  . APPENDECTOMY    . left knee surgery    . TONSILLECTOMY       Medications Prior to Admission  Medication Sig Dispense Refill Last Dose  . aspirin 81 MG EC tablet Take 162 mg by mouth once.     07/09/2011 at 1030  . HYDROcodone-acetaminophen (NORCO) 5-325 MG per tablet 1/2 to 1 tablet by mouth every 4 hours prn pain 20 tablet 0 07/09/2011 at 1000  . Multiple Vitamins-Minerals (MENS MULTIPLUS PO) Take 1 tablet by mouth daily.     07/09/2011      Inpatient Medications:  . amLODipine  10 mg Oral Daily  . aspirin EC  81 mg Oral Daily  . atorvastatin  20 mg Oral q1800  . clopidogrel  75 mg Oral Daily  . hydrALAZINE  25 mg Oral Q8H  . pantoprazole  40 mg Oral Daily    Allergies: No Known Allergies  Social History   Socioeconomic History  . Marital status: Married    Spouse name: Not on file  . Number of children: Not on file  . Years of education: Not on file  . Highest education level: Not on file  Occupational History  . Not on file  Social Needs  . Financial resource strain: Not on file  . Food insecurity:    Worry: Not on file    Inability: Not on file  . Transportation needs:    Medical: Not on file    Non-medical: Not on file  Tobacco Use  . Smoking status: Former Research scientist (life sciences)  . Smokeless tobacco: Never Used  Substance and Sexual Activity  . Alcohol use: No  . Drug use: No  . Sexual activity: Not on file  Lifestyle  . Physical activity:    Days per week: Not on file    Minutes per session: Not on file  . Stress: Not on file  Relationships  . Social connections:    Talks on phone: Not on file    Gets together: Not on file    Attends religious service: Not on file  Active member of club or organization: Not on file    Attends meetings of clubs or organizations: Not on file    Relationship status: Not on file  . Intimate partner violence:    Fear of current or ex partner: Not on file    Emotionally abused: Not on file    Physically abused: Not on file    Forced sexual activity: Not on file  Other Topics Concern  . Not on file  Social History Narrative  . Not on file    Family History: no premature CAD    Review of Systems: All other systems reviewed and are otherwise negative except as noted above.  Physical Exam: Vitals:   07/21/18 1918 07/21/18 2345 07/22/18 0316 07/22/18 0818  BP: (!) 172/71 (!) 117/46 139/72 (!) 144/72  Pulse: 68 72 73 89  Resp: 18 18 18 18   Temp: 98 F (36.7 C)  98.3 F (36.8 C) 98.1 F (36.7 C) 97.7 F (36.5 C)  TempSrc: Oral Oral Oral Oral  SpO2: 100% 99% 98% 100%  Weight:        GEN- The patient is elderly appearing, alert and oriented x 3 today.   Head- normocephalic, atraumatic Eyes-  Sclera clear, conjunctiva pink Ears- hearing intact Oropharynx- clear Neck- supple Lungs- Clear to ausculation bilaterally, normal work of breathing Heart- Regular rate and rhythm  GI- soft, NT, ND, + BS Extremities- no clubbing, cyanosis, or edema MS- no significant deformity or atrophy Skin- no rash or lesion Psych- euthymic mood, full affect   Labs:   Lab Results  Component Value Date   WBC 8.4 07/22/2018   HGB 10.4 (L) 07/22/2018   HCT 32.3 (L) 07/22/2018   MCV 93.1 07/22/2018   PLT 174 07/22/2018    Recent Labs  Lab 07/19/18 0916  07/22/18 0555  NA 142   < > 139  K 4.4   < > 4.3  CL 112*   < > 114*  CO2 23   < > 21*  BUN 44*   < > 48*  CREATININE 3.03*   < > 2.98*  CALCIUM 8.4*   < > 8.3*  PROT 6.9  --   --   BILITOT 0.5  --   --   ALKPHOS 66  --   --   ALT 13  --   --   AST 21  --   --   GLUCOSE 122*   < > 103*   < > = values in this interval not displayed.     Radiology/Studies: Mr Cameron Boyle GB Contrast  Result Date: 07/20/2018 CLINICAL DATA:  Stroke. Status post tPA. Acute onset of left-sided weakness. EXAM: MRI HEAD WITHOUT CONTRAST MRA HEAD WITHOUT CONTRAST TECHNIQUE: Multiplanar, multiecho pulse sequences of the brain and surrounding structures were obtained without intravenous contrast. Angiographic images of the head were obtained using MRA technique without contrast. COMPARISON:  CT head without contrast 07/19/2018 FINDINGS: MRI HEAD FINDINGS Brain: Diffusion-weighted images demonstrate multifocal areas of acute nonhemorrhagic infarction over the right frontal and parietal convexity. Basal ganglia are intact. Insular ribbon is normal. There is some involvement of both pre and postcentral gyrus. Acute hemorrhage mass  lesion is present. Remote nonhemorrhagic lacunar infarct is present in the right internal capsule. Dilated perivascular spaces are present bilaterally. Extensive confluent periventricular white matter changes are noted bilaterally. White matter disease extends into the brainstem. Cerebellum is within normal limits. Auditory canals are within limits bilaterally. Vascular: Flow is present in the major  intracranial arteries. Skull and upper cervical spine: Skull base is normal. Degenerative changes are present at C4-5. Upper cervical spine is otherwise unremarkable. Sinuses/Orbits: The paranasal sinuses and mastoid air cells are clear. Globes and orbits are within normal limits. MRA HEAD FINDINGS The internal carotid arteries are within normal limits from the high cervical segments through the ICA termini bilaterally. The A1 and M1 segments are normal. Anterior communicating artery is patent. ACA and MCA branch vessels are within normal limits. There is no proximal margin large vessel. No aneurysm is present. The left vertebral artery is the dominant vessel. A high-grade stenosis is present in the right V4 segment, just distal to the PICA origin. Vertebrobasilar junction is normal. The basilar artery is left posterior cerebral artery originates basilar tip. The right posterior cerebral artery is of fetal type. PCA branch vessels demonstrate moderate diffuse segmental stenosis without a significant proximal stenosis otherwise. IMPRESSION: 1. Acute/subacute right MCA territory infarcts extending of the right frontal convexity. 2. Remote lacunar infarct of the right internal capsule. 3. Diffuse white matter disease likely reflects the sequela of chronic microvascular ischemia. 4. 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 Electronically Signed   By: San Morelle M.D.   On: 07/20/2018 10:42   Mr Brain Wo Contrast  Result Date: 07/20/2018 CLINICAL DATA:  Stroke.  Status post tPA. Acute onset of left-sided weakness. EXAM: MRI HEAD WITHOUT CONTRAST MRA HEAD WITHOUT CONTRAST TECHNIQUE: Multiplanar, multiecho pulse sequences of the brain and surrounding structures were obtained without intravenous contrast. Angiographic images of the head were obtained using MRA technique without contrast. COMPARISON:  CT head without contrast 07/19/2018 FINDINGS: MRI HEAD FINDINGS Brain: Diffusion-weighted images demonstrate multifocal areas of acute nonhemorrhagic infarction over the right frontal and parietal convexity. Basal ganglia are intact. Insular ribbon is normal. There is some involvement of both pre and postcentral gyrus. Acute hemorrhage mass lesion is present. Remote nonhemorrhagic lacunar infarct is present in the right internal capsule. Dilated perivascular spaces are present bilaterally. Extensive confluent periventricular white matter changes are noted bilaterally. White matter disease extends into the brainstem. Cerebellum is within normal limits. Auditory canals are within limits bilaterally. Vascular: Flow is present in the major intracranial arteries. Skull and upper cervical spine: Skull base is normal. Degenerative changes are present at C4-5. Upper cervical spine is otherwise unremarkable. Sinuses/Orbits: The paranasal sinuses and mastoid air cells are clear. Globes and orbits are within normal limits. MRA HEAD FINDINGS The internal carotid arteries are within normal limits from the high cervical segments through the ICA termini bilaterally. The A1 and M1 segments are normal. Anterior communicating artery is patent. ACA and MCA branch vessels are within normal limits. There is no proximal margin large vessel. No aneurysm is present. The left vertebral artery is the dominant vessel. A high-grade stenosis is present in the right V4 segment, just distal to the PICA origin. Vertebrobasilar junction is normal. The basilar artery is left posterior cerebral artery originates  basilar tip. The right posterior cerebral artery is of fetal type. PCA branch vessels demonstrate moderate diffuse segmental stenosis without a significant proximal stenosis otherwise. IMPRESSION: 1. Acute/subacute right MCA territory infarcts extending of the right frontal convexity. 2. Remote lacunar infarct of the right internal capsule. 3. Diffuse white matter disease likely reflects the sequela of chronic microvascular ischemia. 4. 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 Electronically Signed   By: San Morelle M.D.   On:  07/20/2018 10:42   Ct Head Code Stroke Wo Contrast  Result Date: 07/19/2018 CLINICAL DATA:  Code stroke.  82 year old male left-sided weakness. EXAM: CT HEAD WITHOUT CONTRAST TECHNIQUE: Contiguous axial images were obtained from the base of the skull through the vertex without intravenous contrast. COMPARISON:  Report of brain MRI 07/21/2011. FINDINGS: Brain: Patchy and confluent bilateral cerebral white matter hypodensity. Deep white matter capsule involvement and bilateral deep gray matter heterogeneity. Changes appear progressed since 2012. No acute intracranial hemorrhage identified. No midline shift, mass effect, or evidence of intracranial mass lesion. No ventriculomegaly. No cortical encephalomalacia identified. Vascular: Calcified atherosclerosis at the skull base. No suspicious intracranial vascular hyperdensity. Skull: Negative. Sinuses/Orbits: Prior bilateral paranasal sinus surgery with mild residual mucosal thickening. Tympanic cavities are clear. Other: No acute orbit or scalp soft tissue findings. ASPECTS [facility] Stroke Program Early CT Score) - Ganglionic level infarction (caudate, lentiform nuclei, internal capsule, insula, M1-M3 cortex): 7 - Supraganglionic infarction (M4-M6 cortex): 3 Total score (0-10 with 10 being normal): 10 IMPRESSION: 1. No acute intracranial hemorrhage or acute cortically based infarct.  ASPECTS is 10. 2. Progressed and widespread chronic white matter disease since 2012. 3. Study discussed by telephone with Dr. Virgel Manifold on 07/19/2018 at 09:28 . Electronically Signed   By: Genevie Ann M.D.   On: 07/19/2018 09:29   Vas US Carotid  Result Date: 07/22/2018 Carotid Arterial Duplex Study Indications:  CVA, Speech disturbance, Numbness and Facial droop. Risk Factors: No history of smoking. Performing Technologist: Sharion Dove RVS  Examination Guidelines: A complete evaluation includes B-mode imaging, spectral Doppler, color Doppler, and power Doppler as needed of all accessible portions of each vessel. Bilateral testing is considered an integral part of a complete examination. Limited examinations for reoccurring indications may be performed as noted.  Right Carotid Findings: +----------+--------+--------+--------+-----------+------------------+           PSV cm/sEDV cm/sStenosisDescribe   Comments           +----------+--------+--------+--------+-----------+------------------+ CCA Prox  85      13              homogeneous                   +----------+--------+--------+--------+-----------+------------------+ CCA Distal119     27                         intimal thickening +----------+--------+--------+--------+-----------+------------------+ ICA Prox  114     19              calcific   Shadowing          +----------+--------+--------+--------+-----------+------------------+ ICA Distal92      23                                            +----------+--------+--------+--------+-----------+------------------+ ECA       97      14                                            +----------+--------+--------+--------+-----------+------------------+ +----------+--------+-------+--------+-------------------+           PSV cm/sEDV cmsDescribeArm Pressure (mmHG) +----------+--------+-------+--------+-------------------+ [id number]                                         +----------+--------+-------+--------+-------------------+ +---------+--------+--+--------+--+  VertebralPSV cm/s72EDV cm/s17 +---------+--------+--+--------+--+  Left Carotid Findings: +----------+--------+--------+--------+-----------+---------+           PSV cm/sEDV cm/sStenosisDescribe   Comments  +----------+--------+--------+--------+-----------+---------+ CCA Prox  105     18              homogeneous          +----------+--------+--------+--------+-----------+---------+ CCA Distal103     25              homogeneous          +----------+--------+--------+--------+-----------+---------+ ICA Prox  98      24              calcific   Shadowing +----------+--------+--------+--------+-----------+---------+ ICA Distal133     32                                   +----------+--------+--------+--------+-----------+---------+ ECA       97      8                                    +----------+--------+--------+--------+-----------+---------+ +----------+--------+--------+--------+-------------------+ SubclavianPSV cm/sEDV cm/sDescribeArm Pressure (mmHG) +----------+--------+--------+--------+-------------------+           140                                         +----------+--------+--------+--------+-------------------+ +---------+--------+--+--------+--+ VertebralPSV cm/s65EDV cm/s21 +---------+--------+--+--------+--+  Summary: Right Carotid: Velocities in the right ICA are consistent with a 1-39% stenosis. Left Carotid: Velocities in the left ICA are consistent with a 1-39% stenosis. Vertebrals:  Bilateral vertebral arteries demonstrate antegrade flow. Subclavians: Normal flow hemodynamics were seen in bilateral subclavian              arteries. *See table(s) above for measurements and observations.  Electronically signed by Antony Contras MD on 07/22/2018 at 8:17:35 AM.    Final    Vas [place] Lower Extremity Venous (dvt)  Result Date:  07/21/2018  Lower Venous Study Indications: Stroke.  Comparison Study: No prior study on filek Performing Technologist: Sharion Dove RVS  Examination Guidelines: A complete evaluation includes B-mode imaging, spectral Doppler, color Doppler, and power Doppler as needed of all accessible portions of each vessel. Bilateral testing is considered an integral part of a complete examination. Limited examinations for reoccurring indications may be performed as noted.  Right Venous Findings: +---------+---------------+---------+-----------+----------+-------+          CompressibilityPhasicitySpontaneityPropertiesSummary +---------+---------------+---------+-----------+----------+-------+ CFV      Full           Yes      Yes                          +---------+---------------+---------+-----------+----------+-------+ SFJ      Full                                                 +---------+---------------+---------+-----------+----------+-------+ FV Prox  Full                                                 +---------+---------------+---------+-----------+----------+-------+  FV Mid   Full                                                 +---------+---------------+---------+-----------+----------+-------+ FV DistalFull                                                 +---------+---------------+---------+-----------+----------+-------+ PFV      Full                                                 +---------+---------------+---------+-----------+----------+-------+ POP      Full           Yes      Yes                          +---------+---------------+---------+-----------+----------+-------+ PTV      Full                                                 +---------+---------------+---------+-----------+----------+-------+ PERO     Full                                                 +---------+---------------+---------+-----------+----------+-------+  Left  Venous Findings: +---------+---------------+---------+-----------+----------+-------+          CompressibilityPhasicitySpontaneityPropertiesSummary +---------+---------------+---------+-----------+----------+-------+ CFV      Full           Yes      Yes                          +---------+---------------+---------+-----------+----------+-------+ SFJ      Full                                                 +---------+---------------+---------+-----------+----------+-------+ FV Prox  Full                                                 +---------+---------------+---------+-----------+----------+-------+ FV Mid   Full                                                 +---------+---------------+---------+-----------+----------+-------+ FV DistalFull                                                 +---------+---------------+---------+-----------+----------+-------+  PFV      Full                                                 +---------+---------------+---------+-----------+----------+-------+ POP      Full           Yes      Yes                          +---------+---------------+---------+-----------+----------+-------+ PTV      Full                                                 +---------+---------------+---------+-----------+----------+-------+ PERO     Full                                                 +---------+---------------+---------+-----------+----------+-------+    Summary: Right: There is no evidence of deep vein thrombosis in the lower extremity. Left: There is no evidence of deep vein thrombosis in the lower extremity.  *See table(s) above for measurements and observations. Electronically signed by Curt Jews MD on 07/21/2018 at 8:47:49 AM.    Final     12-lead ECG sinus rhythm (personally reviewed) All prior EKG's in EPIC reviewed with no documented atrial fibrillation  Telemetry sinus rhythm (personally reviewed)  Assessment  and Plan:  1. Cryptogenic stroke The patient presents with cryptogenic stroke.  The patient has a TEE planned for this AM.  I spoke at length with the patient about monitoring for afib with an implantable loop recorder.  Risks, benefits, and alteratives to implantable loop recorder were discussed with the patient today.   At this time, the patient is very clear in their decision to proceed with implantable loop recorder.   Wound care was reviewed with the patient (keep incision clean and dry for 3 days).  Wound check scheduled and entered in AVS.  Please call with questions.   Chanetta Marshall, NP 07/22/2018 9:48 AM  EP attending  Patient seen and examined.  Agree with the findings as noted above.  The patient has sustained a cryptogenic stroke.  He is undergone transesophageal echo with no explanation for the etiology of his stroke.  I discussed the treatment options with the patient.  The risks, goals, benefits, and expectations of insertion of an implantable loop recorder were reviewed and he wishes to proceed.  Crissie Sickles, MD

## 2018-07-23 ENCOUNTER — Encounter (HOSPITAL_COMMUNITY): Payer: Self-pay | Admitting: Internal Medicine

## 2018-07-26 ENCOUNTER — Other Ambulatory Visit: Payer: Self-pay | Admitting: *Deleted

## 2018-07-26 NOTE — Patient Outreach (Signed)
[facility]) Care Management  07/26/2018  OHM DENTLER 12-03-1935 [id number]   EMMI-stroke    RED ON EMMI ALERT Day # 1 Date: Wednesday 07/24/18 1306   Red Alert Reason:  Feeling worse overall? Yes  Scheduled a follow up appointment? No   Outreach attempt # 1 No answer. Line busy [facility] RN CM unable to leave a  voicemail message Plan: [facility] RN CM sent an unsuccessful outreach letter and scheduled this patient for another call attempt within 4 business days   Daymein Nunnery L. Lavina Hamman, RN, BSN, [place] Coordinator Office number [phone] Mobile number [phone]  Main [facility] number [phone] Fax number [phone]

## 2018-07-29 ENCOUNTER — Other Ambulatory Visit: Payer: Self-pay

## 2018-07-29 ENCOUNTER — Encounter (HOSPITAL_COMMUNITY): Payer: Self-pay

## 2018-07-29 ENCOUNTER — Ambulatory Visit (HOSPITAL_COMMUNITY): Payer: Medicare PPO | Admitting: Physical Therapy

## 2018-07-29 ENCOUNTER — Ambulatory Visit (HOSPITAL_COMMUNITY): Payer: Medicare PPO | Attending: Neurology

## 2018-07-29 DIAGNOSIS — R29898 Other symptoms and signs involving the musculoskeletal system: Secondary | ICD-10-CM | POA: Insufficient documentation

## 2018-07-29 DIAGNOSIS — R278 Other lack of coordination: Secondary | ICD-10-CM | POA: Insufficient documentation

## 2018-07-29 DIAGNOSIS — R262 Difficulty in walking, not elsewhere classified: Secondary | ICD-10-CM | POA: Diagnosis present

## 2018-07-29 NOTE — Therapy (Signed)
[place], [place], [address] Phone: [phone]   Fax:  [phone]  Occupational Therapy Evaluation  Patient Details  Name: Cameron Boyle MRN: [id number] Date of Birth: [birth year] Referring Provider (OT): Rosalin Hawking, MD   Encounter Date: 07/29/2018  OT End of Session - 07/29/18 1509    Visit Number  1    Number of Visits  8    Date for OT Re-Evaluation  08/26/18    Authorization Type  [employer]     Authorization Time Period  $15 co pay No visit limit    OT Start Time  1345    OT Stop Time  1430    OT Time Calculation (min)  45 min    Activity Tolerance  Patient tolerated treatment well    Behavior During Therapy  [facility] for tasks assessed/performed       Past Medical History:  Diagnosis Date  . Arthritis    left knee    Past Surgical History:  Procedure Laterality Date  . APPENDECTOMY    . left knee surgery    . LOOP RECORDER INSERTION N/A 07/22/2018   Procedure: LOOP RECORDER INSERTION;  Surgeon: Evans Lance, MD;  Location: [place] [facility];  Service: Cardiovascular;  Laterality: N/A;  . TEE WITHOUT CARDIOVERSION N/A 07/22/2018   Procedure: TRANSESOPHAGEAL ECHOCARDIOGRAM (TEE);  Surgeon: Dorothy Spark, MD;  Location: [facility] ENDOSCOPY;  Service: Cardiovascular;  Laterality: N/A;  . TONSILLECTOMY      There were no vitals filed for this visit.  Subjective Assessment - 07/29/18 1352    Subjective   S: I am weaker in this arm.    Pertinent History  Patient is a 82 y/o male S/P right MCA with left side weakness that occurred on 07/18/18. Pt was transferred from [facility] ED to [facility] for further work up. Patient received acute care OT and PT services while admitted. He was discharged home without therapy.     Special Tests  FOTO score: 92/100.    Patient Stated Goals  To get stronger in his left arm.     Currently in Pain?  No/denies        [facility] OT Assessment - 07/29/18 1352      Assessment   Medical Diagnosis  left side weakness    Referring Provider (OT)  Rosalin Hawking, MD    Onset Date/Surgical Date  07/18/18    Hand Dominance  Left    Next MD Visit  08/01/18    Prior Therapy  Acute OT only      Precautions   Precautions  Other (comment)    Precaution Comments  left side weakness      Restrictions   Weight Bearing Restrictions  No      Balance Screen   Has the patient fallen in the past 6 months  No      Home  Environment   Family/patient expects to be discharged to:  Private residence    Living Arrangements  Spouse/significant other    Lives With  Other ([place])   82 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 Able to complete all basic ADL tasks at home without assistance.       Mobility   Mobility  Status  Independent      Written Expression   Dominant Hand  Left      Vision - History   Baseline Vision  Wears glasses all the time      Cognition   Overall Cognitive Status  Within Functional Limits for tasks assessed      Coordination   9 Hole Peg Test  Right;Left    Right 9 Hole Peg Test  32.1"    Left 9 Hole Peg Test  1'22"    Box and Blocks  Right: 42 Left: 24      ROM / Strength   AROM / PROM / Strength  AROM;PROM;Strength      AROM   Overall AROM   Within functional limits for tasks performed    Overall AROM Comments  Shoulder, elbow, wrist, hands A/ROM WFL in all ranges.       PROM   Overall PROM   Within functional limits for tasks performed    Overall PROM Comments  Shoulder, elbow, wrist, hands P/ROM WFL in all ranges.       Strength   Overall Strength Comments  Assessed seated. IR/er adducted    Strength Assessment Site  Shoulder;Elbow;Hand    Right/Left Shoulder  Left    Left Shoulder Flexion  4/5    Left Shoulder ABduction  4/5    Left Shoulder Internal Rotation  4+/5     Left Shoulder External Rotation  4+/5    Right/Left Elbow  Left    Left Elbow Flexion  5/5    Left Elbow Extension  5/5    Right/Left hand  Right;Left    Right Hand Grip (lbs)  40    Right Hand Lateral Pinch  8 lbs    Right Hand 3 Point Pinch  14 lbs    Left Hand Grip (lbs)  40    Left Hand Lateral Pinch  12 lbs    Left Hand 3 Point Pinch  12 lbs                      OT Education - 07/29/18 1502    Education Details  red theraputty, fine motor coordination, gross motor coordination HEP    Person(s) Educated  Patient    Methods  Explanation;Demonstration;Handout;Verbal cues    Comprehension  Verbalized understanding       OT Short Term Goals - 07/29/18 1524      OT SHORT TERM GOAL #1   Title  Patient will return to highest level of independence while using his LUE as his dominant extremity for all daily tasks.     Time  4    Period  Weeks    Status  New    Target Date  08/26/18      OT SHORT TERM GOAL #2   Title  Patient will increase LUE strength to 5/5 in shoulder in order to complete sustained functional reaching tasks overhead without increased effort.     Time  4    Period  Weeks    Status  New      OT SHORT TERM GOAL #3   Title  Patient will increase his fine and gross motor coordination by completing the 9 hole peg test in 32 seconds or less and being able to move 25 blocks in 1 minute during box and blocks test in order to complete daily tasks with increased motor control and precision.     Time  4  Period  Weeks    Status  New      OT SHORT TERM GOAL #4   Title  Patient will increase his grip strength in his left hand by 10lbs and pinch strength by 5# in order to hold onto items with less difficulty.     Time  4    Period  Weeks    Status  New               Plan - 07/29/18 1520    Clinical Impression Statement  A: Patient is an 82 y/o male S/P right CVA with left side weakness, decreased fine and gross motor coordination resulting  in difficulty using his LUE as his dominant extremity.     Occupational Profile and client history currently impacting functional performance  Pt was independent and left side dominant prior to CVA. Patient is motivated to return to prior level of independence.     Occupational performance deficits (Please refer to evaluation for details):  ADL's;IADL's;Leisure    Rehab Potential  Excellent    Current Impairments/barriers affecting progress:  None noted. Slight hard of hearing.     OT Frequency  2x / week    OT Duration  4 weeks    OT Treatment/Interventions  DME and/or AE instruction;Moist Heat;Self-care/ADL training;Therapeutic activities;Therapeutic exercise;Ultrasound;Cryotherapy;Electrical Stimulation;Manual Therapy;Patient/family education;Passive range of motion;Neuromuscular education;Functional Mobility Training    Plan  P: Pt will benefit from skilled OT services to increase functional use of his left UE while returning to using it as his dominant extremity with increase motor control and strength. Treatment plan: NM re-education, grip and pinch strengthening. left side proximal shoulder strengthening, fine and gross motor coordination.     Clinical Decision Making  Limited treatment options, no task modification necessary    Consulted and Agree with Plan of Care  Patient       Patient will benefit from skilled therapeutic intervention in order to improve the following deficits and impairments:  Decreased coordination, Impaired UE functional use, Decreased strength  Visit Diagnosis: Other symptoms and signs involving the musculoskeletal system - Plan: Ot plan of care cert/re-cert  Other lack of coordination - Plan: Ot plan of care cert/re-cert    Problem List Patient Active Problem List   Diagnosis Date Noted  . Essential hypertension 07/22/2018  . Hyperlipidemia 07/22/2018  . CKD (chronic kidney disease) 07/22/2018  . Anemia likely d/t chronic kidney disease 07/22/2018  . CVA  (cerebral vascular accident) [facility]) - R MCA s/p tPA, embolic, unknown source 22/48/2500   [name], OTR/L,CBIS  [phone]  07/29/2018, 3:48 PM  [name], [place], [address] Phone: [phone]   Fax:  [phone]  Name: KAESYN JOHNSTON MRN: [id number] Date of Birth: 1935/11/05

## 2018-07-29 NOTE — Therapy (Signed)
[place], [place], [address] Phone: [phone]   Fax:  [phone]  Physical Therapy Evaluation  Patient Details  Name: Cameron Boyle MRN: [id number] Date of Birth: 1936-02-09 Referring Provider (PT): Rosalin Hawking   Encounter Date: 07/29/2018  PT End of Session - 07/29/18 1507    Visit Number  1    Number of Visits  1    Authorization Type  [employer]     PT Start Time  1430    PT Stop Time  1500    PT Time Calculation (min)  30 min       Past Medical History:  Diagnosis Date  . Arthritis    left knee    Past Surgical History:  Procedure Laterality Date  . APPENDECTOMY    . left knee surgery    . LOOP RECORDER INSERTION N/A 07/22/2018   Procedure: LOOP RECORDER INSERTION;  Surgeon: Evans Lance, MD;  Location: [place] CV [facility];  Service: Cardiovascular;  Laterality: N/A;  . TEE WITHOUT CARDIOVERSION N/A 07/22/2018   Procedure: TRANSESOPHAGEAL ECHOCARDIOGRAM (TEE);  Surgeon: Dorothy Spark, MD;  Location: [facility] ENDOSCOPY;  Service: Cardiovascular;  Laterality: N/A;  . TONSILLECTOMY      There were no vitals filed for this visit.   Subjective Assessment - 07/29/18 1438    Subjective  Mr. Hazel states that he had a stroke on10/31/2019, he  was discharged from the hospital on 07/22/2018.  He has not noticed any difficulty with balance or leg weakness since he got home.  He is able to walk up and down the steps in a reciprocal manner.     Pertinent History  HTN    How long can you sit comfortably?  no problem    How long can you stand comfortably?  able to stand as long as he wants.     How long can you walk comfortably?  Able to walk for 15 minutes at this time he is not tired he just was done walking     Currently in Pain?  No/denies         [facility] PT Assessment - 07/29/18 1441      Assessment   Medical Diagnosis  left side weakness    Referring Provider (PT)  xu, Jindong    Onset  Date/Surgical Date  07/18/18    Hand Dominance  Left    Next MD Visit  08/01/18    Prior Therapy  acute PT       Precautions   Precautions  Other (comment)    Precaution Comments  left side weakness      Restrictions   Weight Bearing Restrictions  No      Balance Screen   Has the patient fallen in the past 6 months  No    Has the patient had a decrease in activity level because of a fear of falling?   Yes    Is the patient reluctant to leave their home because of a fear of falling?   No      Prior Function   Level of Independence  Independent    Vocation  Retired      Associate Professor   Overall Cognitive Status  Within Functional Limits for tasks assessed      Functional Tests   Functional tests  Single leg stance;Sit to Stand      Single Leg Stance   Comments  B 60"  Sit to Stand   Comments  5x 13.57      ROM / Strength   AROM / PROM / Strength  --   Strength in B LE is wnl for hip, knee and ankle      Ambulation/Gait   Ambulation Distance (Feet)  490 Feet    Assistive device  None    Stairs  Yes    Stairs Assistance  7: Independent    Stair Management Technique  One rail Right;Alternating pattern    Number of Stairs  5    Height of Stairs  7    Gait Comments  slight limp but pt has had a "bad" left knee for years and states this is how he ambulated prior to the stroke.                 Objective measurements completed on examination: See above findings.              PT Education - 07/29/18 1504    Education Details  To begin walking at home begin with five minutes of straight walking a day increase by five minutes as able until pt is walking at least 20 minutes     Person(s) Educated  Patient    Methods  Explanation    Comprehension  Verbalized understanding       PT Short Term Goals - 07/29/18 1511      PT SHORT TERM GOAL #1   Title  Pt to be completing a daily home walking program    Time  1    Period  Days    Status  New     Target Date  07/29/18                Plan - 07/29/18 1508    Clinical Impression Statement  Mr. Gumbs is an 82 yo male who had a CVA on 07/18/2018.  He is currently being referred for skilled physical therapy.  He has no complaints of balance or strength in his LE;  his only concern is his left arm.  At his time Mr. Lema does not need skilled physical therapy services.  Throughout the evaluation Mr. Deike is coughing and sounds congested I am therefore sending a request for a modified swallow study to rule out any silent aspiration.     History and Personal Factors relevant to plan of care:  HTN    Clinical Presentation  Stable    Clinical Decision Making  Low    Rehab Potential  Good    PT Frequency  1x / week    PT Duration  --   1 wk   PT Treatment/Interventions  ADLs/Self Care Home Management    PT Next Visit Plan  discharge to home walking program.        Patient will benefit from skilled therapeutic intervention in order to improve the following deficits and impairments:  Difficulty walking  Visit Diagnosis: Difficulty in walking, not elsewhere classified     Problem List Patient Active Problem List   Diagnosis Date Noted  . Essential hypertension 07/22/2018  . Hyperlipidemia 07/22/2018  . CKD (chronic kidney disease) 07/22/2018  . Anemia likely d/t chronic kidney disease 07/22/2018  . CVA (cerebral vascular accident) [facility]) - R MCA s/p tPA, embolic, unknown source 86/76/1950    Rayetta Humphrey, PT CLT [phone] 07/29/2018, 3:14 PM  [place], [place], [address] Phone: [phone]   Fax:  [phone]  Name: CRISTEN MURCIA MRN: [id number] Date of Birth: [birth year]

## 2018-07-29 NOTE — Patient Instructions (Signed)
Home Exercises Program Theraputty Exercises  Do the following exercises 2-3 times a day using your affected  Left hand.  1. Roll putty into a ball.  2. Make into a pancake.  3. Roll putty into a roll.  4. Pinch along log with first finger and thumb.   5. Make into a ball.  6. Roll it back into a log.   7. Pinch using thumb and side of first finger.  8. Roll into a ball, then flatten into a pancake.  9. Using your fingers, make putty into a mountain 10. Squeeze and release putty 10-12 times.   Fine Motor Coordination Exercises  Perform the following exercises 2-3 times a day, as recommended by your occupational therapist.   Lift fingers and thumb off table one at a time. Increase speed as able. (10 times)  Touch thumb to each fingertip. Increase speed as able. (10 times)  Pick up 5 small objects (coins, marbles, paperclips, beads, etc.) one at a time and hold them in hand, then place them one by one onto the table.  Pick up small objects and place them into a cup or container.  Place clothespins onto the edge of a cup, can, or container.  Play card games. Practice shuffling and dealing cards. Flip cards over onto table one by one.  Stack approximately [employer] (checkers, coins, etc.) onto table.  With tweezers, pick up small objects and put into a small container. Try sorting beads or buttons.   Occupational Therapy Exercises (10 repetitions for each)     Both arms straight out in front of you, keep your elbows straight do not let them bend, the criss/cross your arms in front of you.  Both arms straight out in front of you, keep elbows straight.  Bend your wrist back and make small circles in the air.  Go in one direction the go in the other (Like you are waxing something)  Both arms straight out in front of you, keep elbows straight.  Bend your wrists back and move your arm up while the left arm goes down as quick as you can.  With your right/left arm  straight out in front of you, keep your elbow straight.  Write your name in the air 3-4 times quickly.  Put both of your hands on your lap with your palms down, turn your palms up and down as fast as you can.  Put both of your hands on your lap keeping your wrists on your lap at all times.  No pat on your lap quickly alternating right/left.  Put your hands on your lap and drum your fingers.  Make sure your fingers are moving individually.

## 2018-07-31 ENCOUNTER — Encounter (HOSPITAL_COMMUNITY): Payer: Self-pay

## 2018-07-31 ENCOUNTER — Ambulatory Visit (HOSPITAL_COMMUNITY): Payer: Medicare PPO

## 2018-07-31 ENCOUNTER — Encounter

## 2018-07-31 DIAGNOSIS — R29898 Other symptoms and signs involving the musculoskeletal system: Secondary | ICD-10-CM | POA: Diagnosis not present

## 2018-07-31 DIAGNOSIS — R278 Other lack of coordination: Secondary | ICD-10-CM

## 2018-07-31 NOTE — Therapy (Signed)
[place], [place], [address] Phone: [phone]   Fax:  [phone]  Occupational Therapy Treatment  Patient Details  Name: Cameron Boyle MRN: [id number] Date of Birth: [birth year] Referring Provider (OT): Rosalin Hawking, MD   Encounter Date: 07/31/2018  OT End of Session - 07/31/18 1739    Visit Number  2    Number of Visits  8    Date for OT Re-Evaluation  08/26/18    Authorization Type  [employer]     Authorization Time Period  $15 co pay No visit limit    OT Start Time  1523   pt arrived late   OT Stop Time  1600    OT Time Calculation (min)  37 min    Activity Tolerance  Patient tolerated treatment well    Behavior During Therapy  [facility] for tasks assessed/performed       Past Medical History:  Diagnosis Date  . Arthritis    left knee    Past Surgical History:  Procedure Laterality Date  . APPENDECTOMY    . left knee surgery    . LOOP RECORDER INSERTION N/A 07/22/2018   Procedure: LOOP RECORDER INSERTION;  Surgeon: Evans Lance, MD;  Location: [place] CV LAB;  Service: Cardiovascular;  Laterality: N/A;  . TEE WITHOUT CARDIOVERSION N/A 07/22/2018   Procedure: TRANSESOPHAGEAL ECHOCARDIOGRAM (TEE);  Surgeon: Dorothy Spark, MD;  [facility] ENDOSCOPY;  Service: Cardiovascular;  Laterality: N/A;  . TONSILLECTOMY      There were no vitals filed for this visit.  Subjective Assessment - 07/31/18 1528    Subjective   S: That dough that you gave me is tougher than the other stuff I have.     Currently in Pain?  No/denies         [facility] OT Assessment - 07/31/18 1529      Assessment   Medical Diagnosis  left side weakness      Precautions   Precautions  Other (comment)    Precaution Comments  left side weakness               OT Treatments/Exercises (OP) - 07/31/18 1529      Exercises   Exercises  Hand;Theraputty      Hand Exercises   Hand Gripper with Large Beads  all  beads with gripper set at 35#   horizontal    Hand Gripper with Medium Beads  all beads with gripper set to 35#   horizontal   Hand Gripper with Small Beads  all beads with gripper set to 35#   horizontal   Sponges  11, 12    Other Hand Exercises  Pt utilized red resistive clothespin to pick up 30 sponges utilizing a 2-3 point pinch with verbal cues to maintain. increased time needed due to lack of motor control      Theraputty   Theraputty - Flatten  red- patient utilized pvc pipe to cut circles into putty for 1 row focusing on wrist extension/flexion as well as left hand strength.      Fine Motor Coordination (Hand/Wrist)   Fine Motor Coordination  Grooved pegs    Grooved pegs  Pt completed grooved pegboard as he was only able to hold onto 1 peg at a time when placing. He presented with decreased motor control in the proximal shoulder region. Patient was educated to keep his elbow down by his side and use more wrist mobility to complete  task. patient was able to correct. Pt removed pegs while holding as many as he could without dropping them. Only dropped 2-3 pegs.              OT Education - 07/31/18 1739    Education Details  Reviewed goals with patient    Person(s) Educated  Patient    Methods  Explanation    Comprehension  Verbalized understanding       OT Short Term Goals - 07/31/18 1740      OT SHORT TERM GOAL #1   Title  Patient will return to highest level of independence while using his LUE as his dominant extremity for all daily tasks.     Time  4    Period  Weeks    Status  On-going      OT SHORT TERM GOAL #2   Title  Patient will increase LUE strength to 5/5 in shoulder in order to complete sustained functional reaching tasks overhead without increased effort.     Time  4    Period  Weeks    Status  On-going      OT SHORT TERM GOAL #3   Title  Patient will increase his fine and gross motor coordination by completing the 9 hole peg test in 32 seconds or  less and being able to move 25 blocks in 1 minute during box and blocks test in order to complete daily tasks with increased motor control and precision.     Time  4    Period  Weeks    Status  On-going      OT SHORT TERM GOAL #4   Title  Patient will increase his grip strength in his left hand by 10lbs and pinch strength by 5# in order to hold onto items with less difficulty.     Time  4    Period  Weeks    Status  On-going               Plan - 07/31/18 1740    Clinical Impression Statement  A: Initiated pinch strengthening, in hand manipulation, and fine motor coordination with LUE. patient did present with decreased motor coordination during grooved pegboard due to decreased proximal shoulder strength. More control was present when cued to keep elbow adducted closer to body.     Plan  P: Add theraputty (red). Completed red theraband shoulder strengthening.     Consulted and Agree with Plan of Care  Patient       Patient will benefit from skilled therapeutic intervention in order to improve the following deficits and impairments:  Decreased coordination, Impaired UE functional use, Decreased strength  Visit Diagnosis: Other symptoms and signs involving the musculoskeletal system  Other lack of coordination    Problem List Patient Active Problem List   Diagnosis Date Noted  . Essential hypertension 07/22/2018  . Hyperlipidemia 07/22/2018  . CKD (chronic kidney disease) 07/22/2018  . Anemia likely d/t chronic kidney disease 07/22/2018  . CVA (cerebral vascular accident) [facility]) - R MCA s/p tPA, embolic, unknown source 08/65/7846   [name], OTR/L,CBIS  [phone]  07/31/2018, 5:42 PM  [place], [place], [address] Phone: [phone]   Fax:  [phone]  Name: Cameron Boyle MRN: [id number] Date of Birth: 1935-12-18

## 2018-08-01 ENCOUNTER — Ambulatory Visit (INDEPENDENT_AMBULATORY_CARE_PROVIDER_SITE_OTHER): Payer: Medicare PPO | Admitting: *Deleted

## 2018-08-01 DIAGNOSIS — I639 Cerebral infarction, unspecified: Secondary | ICD-10-CM

## 2018-08-01 LAB — CUP PACEART INCLINIC DEVICE CHECK
Date Time Interrogation Session: 20191114145127
MDC IDC PG IMPLANT DT: 20191104

## 2018-08-01 NOTE — Progress Notes (Signed)
Wound check appointment. Steri-strips removed prior to appointment. Wound without redness or edema. Incision edges approximated, wound well healed. Battery status: Good. R-waves 1.2mV. 0 symptom episodes, 0 tachy episodes, 0 pause episodes, 0 brady episodes. 0 AF episodes (0% burden). Monthly summary reports and ROV with GT PRN.

## 2018-08-05 ENCOUNTER — Ambulatory Visit (HOSPITAL_COMMUNITY): Payer: Medicare PPO

## 2018-08-05 ENCOUNTER — Encounter (HOSPITAL_COMMUNITY): Payer: Self-pay

## 2018-08-05 DIAGNOSIS — R29898 Other symptoms and signs involving the musculoskeletal system: Secondary | ICD-10-CM

## 2018-08-05 DIAGNOSIS — R278 Other lack of coordination: Secondary | ICD-10-CM

## 2018-08-05 NOTE — Therapy (Signed)
[place], [place], [address] Phone: [phone]   Fax:  [phone]  Occupational Therapy Treatment  Patient Details  Name: Cameron Boyle MRN: [id number] Date of Birth: [birth year] Referring Provider (OT): Rosalin Hawking, MD   Encounter Date: 08/05/2018  OT End of Session - 08/05/18 0939    Visit Number  3    Number of Visits  8    Date for OT Re-Evaluation  08/26/18    Authorization Type  [employer]     Authorization Time Period  $15 co pay No visit limit    OT Start Time  0900    OT Stop Time  0940    OT Time Calculation (min)  40 min    Activity Tolerance  Patient tolerated treatment well    Behavior During Therapy  [facility] for tasks assessed/performed       Past Medical History:  Diagnosis Date  . Arthritis    left knee    Past Surgical History:  Procedure Laterality Date  . APPENDECTOMY    . left knee surgery    . LOOP RECORDER INSERTION N/A 07/22/2018   Procedure: LOOP RECORDER INSERTION;  Surgeon: Evans Lance, MD;  Location: [place] [facility];  Service: Cardiovascular;  Laterality: N/A;  . TEE WITHOUT CARDIOVERSION N/A 07/22/2018   Procedure: TRANSESOPHAGEAL ECHOCARDIOGRAM (TEE);  Surgeon: Dorothy Spark, MD;  Location: [facility] ENDOSCOPY;  Service: Cardiovascular;  Laterality: N/A;  . TONSILLECTOMY      There were no vitals filed for this visit.  Subjective Assessment - 08/05/18 0904    Subjective   S: Yesterday I was able to eat my breakfast with my left hand.     Currently in Pain?  No/denies         [facility] OT Assessment - 08/05/18 0906      Assessment   Medical Diagnosis  left side weakness      Precautions   Precautions  Other (comment)    Precaution Comments  left side weakness               OT Treatments/Exercises (OP) - 08/05/18 0906      Exercises   Exercises  Shoulder;Hand;Theraputty      Shoulder Exercises: Seated   Extension  Theraband;10 reps    Theraband  Level (Shoulder Extension)  Level 2 (Red)    Protraction  Theraband;10 reps    Theraband Level (Shoulder Protraction)  Level 2 (Red)    Horizontal ABduction  Theraband;10 reps    Theraband Level (Shoulder Horizontal ABduction)  Level 2 (Red)    External Rotation  Theraband;10 reps    Theraband Level (Shoulder External Rotation)  Level 2 (Red)    Internal Rotation  Theraband;10 reps    Theraband Level (Shoulder Internal Rotation)  Level 2 (Red)    Flexion  Theraband;10 reps    Theraband Level (Shoulder Flexion)  Level 2 (Red)    Abduction  Theraband;10 reps    Theraband Level (Shoulder ABduction)  Level 2 (Red)      Shoulder Exercises: ROM/Strengthening   UBE (Upper Arm Bike)  Level 5 2' reverse 2' forward   pace: 5.0     Hand Exercises   Other Hand Exercises  red putty: thumb opposition strength; 10X    Other Hand Exercises  PVC pipe used to press circles in to flattened red theraputty focusing on UB strength and Wrist flexion/extension      Theraputty   Theraputty -  Roll  red    Theraputty - Grip  red - pronated    Theraputty - Pinch  red - lateral and 3 point               OT Short Term Goals - 07/31/18 1740      OT SHORT TERM GOAL #1   Title  Patient will return to highest level of independence while using his LUE as his dominant extremity for all daily tasks.     Time  4    Period  Weeks    Status  On-going      OT SHORT TERM GOAL #2   Title  Patient will increase LUE strength to 5/5 in shoulder in order to complete sustained functional reaching tasks overhead without increased effort.     Time  4    Period  Weeks    Status  On-going      OT SHORT TERM GOAL #3   Title  Patient will increase his fine and gross motor coordination by completing the 9 hole peg test in 32 seconds or less and being able to move 25 blocks in 1 minute during box and blocks test in order to complete daily tasks with increased motor control and precision.     Time  4    Period  Weeks     Status  On-going      OT SHORT TERM GOAL #4   Title  Patient will increase his grip strength in his left hand by 10lbs and pinch strength by 5# in order to hold onto items with less difficulty.     Time  4    Period  Weeks    Status  On-going               Plan - 08/05/18 1323    Clinical Impression Statement  A: Began session focusing on shoulder strengthening using red theraband then transitioned to table top tasks for grip and pinch strengthening. VC for form and technique during band exercises.     Plan  P: Continue with strengthening LUE using therapy ball shoulder exercises. colored pegboard for fine motor coordination.     Consulted and Agree with Plan of Care  Patient       Patient will benefit from skilled therapeutic intervention in order to improve the following deficits and impairments:  Decreased coordination, Impaired UE functional use, Decreased strength  Visit Diagnosis: Other symptoms and signs involving the musculoskeletal system  Other lack of coordination    Problem List Patient Active Problem List   Diagnosis Date Noted  . Essential hypertension 07/22/2018  . Hyperlipidemia 07/22/2018  . CKD (chronic kidney disease) 07/22/2018  . Anemia likely d/t chronic kidney disease 07/22/2018  . 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

## 2018-08-07 ENCOUNTER — Ambulatory Visit (HOSPITAL_COMMUNITY): Payer: Medicare PPO

## 2018-08-07 ENCOUNTER — Ambulatory Visit (HOSPITAL_COMMUNITY): Payer: Medicare PPO | Admitting: Physical Therapy

## 2018-08-07 ENCOUNTER — Encounter (HOSPITAL_COMMUNITY): Payer: Self-pay

## 2018-08-07 DIAGNOSIS — R29898 Other symptoms and signs involving the musculoskeletal system: Secondary | ICD-10-CM

## 2018-08-07 DIAGNOSIS — R278 Other lack of coordination: Secondary | ICD-10-CM

## 2018-08-07 NOTE — Patient Instructions (Addendum)
Complete 10-12 repetitions. 1 set. 1 time a day. Use 1lb hand weights, or a water bottle or can of soup.  FREE WEIGHT - ABDUCTION  While holding a weight and elbow straight, bring up your arm to the side.        Overhead Press  Raise your elbows to shoulder height out to your side. Raise your arms over your head. Hold and then gently lower your arms to shoulder height.     Chest Press  Sit in a chair with your head up and back straight. Start with your elbows bent holding the weights at your chest. Push the weights straight out in front of you until your arms are straight. Pull the weights back slowly to the starting position.         65 90 ABDUCTION WITH FREE WEIGHT - ER ISO  Start by holding weights at your side with elbows bent to 90 degrees. Then raise up your elbows away from your side while maintaining your elbows bent at 90 degrees the entire time.     Shoulder flexion Both arms are straight. Back stays straight. Lift both arms up.

## 2018-08-07 NOTE — Therapy (Signed)
[place], [place], [address] Phone: [phone]   Fax:  [phone]  Occupational Therapy Treatment  Patient Details  Name: Cameron Boyle MRN: [id number] Date of Birth: 1935-12-09 Referring Provider (OT): Rosalin Hawking, MD   Encounter Date: 08/07/2018  OT End of Session - 08/07/18 1354    Visit Number  4    Number of Visits  8    Date for OT Re-Evaluation  08/26/18    Authorization Type  [employer]     Authorization Time Period  $15 co pay No visit limit    OT Start Time  0947    OT Stop Time  1030    OT Time Calculation (min)  43 min    Activity Tolerance  Patient tolerated treatment well    Behavior During Therapy  [facility] for tasks assessed/performed       Past Medical History:  Diagnosis Date  . Arthritis    left knee    Past Surgical History:  Procedure Laterality Date  . APPENDECTOMY    . left knee surgery    . LOOP RECORDER INSERTION N/A 07/22/2018   Procedure: LOOP RECORDER INSERTION;  Surgeon: Evans Lance, MD;  Location: [place] [facility];  Service: Cardiovascular;  Laterality: N/A;  . TEE WITHOUT CARDIOVERSION N/A 07/22/2018   Procedure: TRANSESOPHAGEAL ECHOCARDIOGRAM (TEE);  Surgeon: Dorothy Spark, MD;  Location: [facility] ENDOSCOPY;  Service: Cardiovascular;  Laterality: N/A;  . TONSILLECTOMY      There were no vitals filed for this visit.  Subjective Assessment - 08/07/18 1353    Subjective   S: There is a path that goes around my property and I have been walking that every day. I do one lap around.     Currently in Pain?  No/denies         [facility] OT Assessment - 08/07/18 1353      Assessment   Medical Diagnosis  left side weakness      Precautions   Precautions  Other (comment)    Precaution Comments  left side weakness               OT Treatments/Exercises (OP) - 08/07/18 1006      Exercises   Exercises  Shoulder;Hand      Shoulder Exercises: Seated    Protraction  Strengthening;10 reps    Protraction Weight (lbs)  1    Horizontal ABduction  Strengthening;10 reps    Horizontal ABduction Weight (lbs)  1    External Rotation  Strengthening;10 reps    External Rotation Weight (lbs)  1    Internal Rotation  Strengthening;10 reps    Internal Rotation Weight (lbs)  1    Flexion  Strengthening;10 reps    Flexion Weight (lbs)  1    Abduction  Strengthening;10 reps    ABduction Weight (lbs)  1      Shoulder Exercises: Therapy Ball   Other Therapy Ball Exercises  green therapy ball: circles (right/left), flexion, chest press; 10X      Hand Exercises   Hand Gripper with Large Beads  all beads with gripper set at 35#   horiziontal    Hand Gripper with Medium Beads  all beads with gripper set to 35#   horizontal   Hand Gripper with Small Beads  all beads with gripper set to 35#   horizontal   Other Hand Exercises  Pt complete colored pegboard using tweezers to recreate pattern focusing  on fine motor coordination. increased time needed with min difficulty.              OT Education - 08/07/18 1018    Education Details  shoulder strengthening with 1# hand weights.     Person(s) Educated  Patient    Methods  Explanation;Demonstration;Handout;Verbal cues    Comprehension  Verbalized understanding;Returned demonstration       OT Short Term Goals - 07/31/18 1740      OT SHORT TERM GOAL #1   Title  Patient will return to highest level of independence while using his LUE as his dominant extremity for all daily tasks.     Time  4    Period  Weeks    Status  On-going      OT SHORT TERM GOAL #2   Title  Patient will increase LUE strength to 5/5 in shoulder in order to complete sustained functional reaching tasks overhead without increased effort.     Time  4    Period  Weeks    Status  On-going      OT SHORT TERM GOAL #3   Title  Patient will increase his fine and gross motor coordination by completing the 9 hole peg test in 32  seconds or less and being able to move 25 blocks in 1 minute during box and blocks test in order to complete daily tasks with increased motor control and precision.     Time  4    Period  Weeks    Status  On-going      OT SHORT TERM GOAL #4   Title  Patient will increase his grip strength in his left hand by 10lbs and pinch strength by 5# in order to hold onto items with less difficulty.     Time  4    Period  Weeks    Status  On-going               Plan - 08/07/18 1355    Clinical Impression Statement  A: Completed strengthening of the shoulder with 1# weights. HEP was updated. Patient showed signs of muscle fatigue and took rest breaks as needed. VC for form and technique. Patient was able to complete handgripper using 35# with minimal difficulty compared to previous time.     Plan  P: Follow up on shoulder HEP. Complete pinch strengthening with resistive clothespins. If completed previously, attempt to increase resistance. Complete handgripper with 37#.    Consulted and Agree with Plan of Care  Patient       Patient will benefit from skilled therapeutic intervention in order to improve the following deficits and impairments:  Decreased coordination, Impaired UE functional use, Decreased strength  Visit Diagnosis: Other symptoms and signs involving the musculoskeletal system  Other lack of coordination    Problem List Patient Active Problem List   Diagnosis Date Noted  . Essential hypertension 07/22/2018  . Hyperlipidemia 07/22/2018  . CKD (chronic kidney disease) 07/22/2018  . Anemia likely d/t chronic kidney disease 07/22/2018  . 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

## 2018-08-12 ENCOUNTER — Encounter (HOSPITAL_COMMUNITY): Payer: Self-pay | Admitting: Occupational Therapy

## 2018-08-12 ENCOUNTER — Encounter

## 2018-08-12 ENCOUNTER — Ambulatory Visit (HOSPITAL_COMMUNITY): Payer: Medicare PPO | Admitting: Occupational Therapy

## 2018-08-12 DIAGNOSIS — R29898 Other symptoms and signs involving the musculoskeletal system: Secondary | ICD-10-CM

## 2018-08-12 DIAGNOSIS — R278 Other lack of coordination: Secondary | ICD-10-CM

## 2018-08-12 NOTE — Therapy (Signed)
[place], [place], [address] Phone: [phone]   Fax:  [phone]  Occupational Therapy Treatment  Patient Details  Name: Cameron Boyle MRN: [id number] Date of Birth: 02-23-1936 Referring Provider (OT): Rosalin Hawking, MD   Encounter Date: 08/12/2018  OT End of Session - 08/12/18 1115    Visit Number  5    Number of Visits  8    Date for OT Re-Evaluation  08/26/18    Authorization Type  [employer]     Authorization Time Period  $15 co pay No visit limit    OT Start Time  1034    OT Stop Time  1116    OT Time Calculation (min)  42 min    Activity Tolerance  Patient tolerated treatment well    Behavior During Therapy  [facility] for tasks assessed/performed       Past Medical History:  Diagnosis Date  . Arthritis    left knee    Past Surgical History:  Procedure Laterality Date  . APPENDECTOMY    . left knee surgery    . LOOP RECORDER INSERTION N/A 07/22/2018   Procedure: LOOP RECORDER INSERTION;  Surgeon: Evans Lance, MD;  Location: [place] [facility];  Service: Cardiovascular;  Laterality: N/A;  . TEE WITHOUT CARDIOVERSION N/A 07/22/2018   Procedure: TRANSESOPHAGEAL ECHOCARDIOGRAM (TEE);  Surgeon: Dorothy Spark, MD;  Location: [facility] ENDOSCOPY;  Service: Cardiovascular;  Laterality: N/A;  . TONSILLECTOMY      There were no vitals filed for this visit.  Subjective Assessment - 08/12/18 1030    Subjective   S: My thumb and first 2 fingers have been numb for a long time.     Currently in Pain?  No/denies         [facility] OT Assessment - 08/12/18 1030      Assessment   Medical Diagnosis  left side weakness      Precautions   Precautions  Other (comment)    Precaution Comments  left side weakness               OT Treatments/Exercises (OP) - 08/12/18 1039      Exercises   Exercises  Shoulder;Hand      Shoulder Exercises: Seated   Protraction  Strengthening;10 reps    Protraction  Weight (lbs)  1    Horizontal ABduction  Strengthening;10 reps    Horizontal ABduction Weight (lbs)  1    External Rotation  Strengthening;10 reps    External Rotation Weight (lbs)  1    Internal Rotation  Strengthening;10 reps    Internal Rotation Weight (lbs)  1    Flexion  Strengthening;10 reps    Flexion Weight (lbs)  1    Abduction  Strengthening;10 reps    ABduction Weight (lbs)  1      Shoulder Exercises: ROM/Strengthening   X to V Arms  10X    Proximal Shoulder Strengthening, Seated  10X each, 1# no rest breaks      Hand Exercises   Hand Gripper with Large Beads  all beads with gripper set at 37#    Hand Gripper with Medium Beads  all beads with gripper set to 37#    Hand Gripper with Small Beads  all beads with gripper set to 35#   horizontal    Sponges  Pt used red clothespin and 3 point pinch to stack 5 high resistance sponges. Mod difficulty due to coordination issues.  Pt then used green clothespin to stack 5 sponges, mod difficulty      Fine Motor Coordination (Hand/Wrist)   Fine Motor Coordination  Grooved pegs    Grooved pegs  Pt worked on grooved pegboard activity, placing one peg at a time. Pt focusing on sitting tall versus leaning during task, as well as keeping elbow by his side. Min difficulty and slightly increased time for completion.                OT Short Term Goals - 07/31/18 1740      OT SHORT TERM GOAL #1   Title  Patient will return to highest level of independence while using his LUE as his dominant extremity for all daily tasks.     Time  4    Period  Weeks    Status  On-going      OT SHORT TERM GOAL #2   Title  Patient will increase LUE strength to 5/5 in shoulder in order to complete sustained functional reaching tasks overhead without increased effort.     Time  4    Period  Weeks    Status  On-going      OT SHORT TERM GOAL #3   Title  Patient will increase his fine and gross motor coordination by completing the 9 hole peg test in  32 seconds or less and being able to move 25 blocks in 1 minute during box and blocks test in order to complete daily tasks with increased motor control and precision.     Time  4    Period  Weeks    Status  On-going      OT SHORT TERM GOAL #4   Title  Patient will increase his grip strength in his left hand by 10lbs and pinch strength by 5# in order to hold onto items with less difficulty.     Time  4    Period  Weeks    Status  On-going               Plan - 08/12/18 1149    Clinical Impression Statement  A: Continued with shoulder strengthening using 1# weights, added x to v arms and proximal shoulder strengthening today. Increased hand gripper to 37# for large and medium beads, increased time required due to fatigue. Added pinch task with resistive clothespin, min/mod diffculty due to coordination deficits. Verbal cuing for form and technique during activities.     Plan  P: Continue with shoulder strengthening, add table building activity for fine motor coordination task       Patient will benefit from skilled therapeutic intervention in order to improve the following deficits and impairments:  Decreased coordination, Impaired UE functional use, Decreased strength  Visit Diagnosis: Other symptoms and signs involving the musculoskeletal system  Other lack of coordination    Problem List Patient Active Problem List   Diagnosis Date Noted  . Essential hypertension 07/22/2018  . Hyperlipidemia 07/22/2018  . CKD (chronic kidney disease) 07/22/2018  . Anemia likely d/t chronic kidney disease 07/22/2018  . 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

## 2018-08-14 ENCOUNTER — Ambulatory Visit (HOSPITAL_COMMUNITY): Payer: Medicare PPO

## 2018-08-14 ENCOUNTER — Encounter (HOSPITAL_COMMUNITY): Payer: Self-pay

## 2018-08-14 ENCOUNTER — Encounter

## 2018-08-14 DIAGNOSIS — R29898 Other symptoms and signs involving the musculoskeletal system: Secondary | ICD-10-CM

## 2018-08-14 DIAGNOSIS — R278 Other lack of coordination: Secondary | ICD-10-CM

## 2018-08-14 NOTE — Therapy (Signed)
[place], [place], [address] Phone: [phone]   Fax:  [phone]  Occupational Therapy Treatment  Patient Details  Name: Cameron Boyle MRN: [id number] Date of Birth: December 19, 1935 Referring Provider (OT): Rosalin Hawking, MD   Encounter Date: 08/14/2018  OT End of Session - 08/14/18 1126    Visit Number  5    Number of Visits  8    Date for OT Re-Evaluation  08/26/18    Authorization Type  [employer]     Authorization Time Period  $15 co pay No visit limit    OT Start Time  1045    OT Stop Time  1130    OT Time Calculation (min)  45 min    Activity Tolerance  Patient tolerated treatment well    Behavior During Therapy  [facility] for tasks assessed/performed       Past Medical History:  Diagnosis Date  . Arthritis    left knee    Past Surgical History:  Procedure Laterality Date  . APPENDECTOMY    . left knee surgery    . LOOP RECORDER INSERTION N/A 07/22/2018   Procedure: LOOP RECORDER INSERTION;  Surgeon: Evans Lance, MD;  Location: [place] [facility];  Service: Cardiovascular;  Laterality: N/A;  . TEE WITHOUT CARDIOVERSION N/A 07/22/2018   Procedure: TRANSESOPHAGEAL ECHOCARDIOGRAM (TEE);  Surgeon: Dorothy Spark, MD;  Location: [facility] ENDOSCOPY;  Service: Cardiovascular;  Laterality: N/A;  . TONSILLECTOMY      There were no vitals filed for this visit.  Subjective Assessment - 08/14/18 1049    Subjective   S: Nothing new to report.     Currently in Pain?  No/denies         [facility] OT Assessment - 08/14/18 1049      Assessment   Medical Diagnosis  left side weakness      Precautions   Precautions  Other (comment)    Precaution Comments  left side weakness               OT Treatments/Exercises (OP) - 08/14/18 1049      Exercises   Exercises  Shoulder;Hand      Shoulder Exercises: Seated   Protraction  Strengthening;10 reps    Protraction Weight (lbs)  2    Horizontal ABduction   Strengthening;10 reps    Horizontal ABduction Weight (lbs)  2    External Rotation  Strengthening;10 reps    External Rotation Weight (lbs)  2    Internal Rotation  Strengthening;10 reps    Internal Rotation Weight (lbs)  2    Flexion  Strengthening;10 reps    Flexion Weight (lbs)  2    Abduction  Strengthening;10 reps    ABduction Weight (lbs)  2    Other Seated Exercises  Overhead press; 10X; 2#      Shoulder Exercises: Therapy Ball   Other Therapy Ball Exercises  green therapy ball: chest press, circles (right/left), flexion; 10X      Shoulder Exercises: ROM/Strengthening   X to V Arms  10X with 2#    Proximal Shoulder Strengthening, Seated  10X; 2#; with visual and verbal cues. no rest break      Hand Exercises   Other Hand Exercises  Utilized blue clothespin to pick up 18 sponges and green resistive clothespin to pick up 12 sponges using a 3 point pinch focusing on pinch strength.       Fine Motor Coordination (Hand/Wrist)  Fine Motor Coordination  Picking up coins;Manipulating coins;Stacking coins;In hand manipuation training    In Scientist, clinical (histocompatibility and immunogenetics)  Ping pong task used with left hand to work on fine motor coordination and in hand manipulation while replicating the pattern provided. Min difficulty with average time needed.     Picking up coins  Pt picked up 5 coins one at a time using his pointer to slide coin to the edge of table prior to picking up.     Manipulating coins  Patient was able to transfer coins from fingertip to palm and back to fingertip to stack    Stacking coins  patient stacked coins on table 5 high.               OT Short Term Goals - 07/31/18 1740      OT SHORT TERM GOAL #1   Title  Patient will return to highest level of independence while using his LUE as his dominant extremity for all daily tasks.     Time  4    Period  Weeks    Status  On-going      OT SHORT TERM GOAL #2   Title  Patient will increase LUE strength to 5/5 in  shoulder in order to complete sustained functional reaching tasks overhead without increased effort.     Time  4    Period  Weeks    Status  On-going      OT SHORT TERM GOAL #3   Title  Patient will increase his fine and gross motor coordination by completing the 9 hole peg test in 32 seconds or less and being able to move 25 blocks in 1 minute during box and blocks test in order to complete daily tasks with increased motor control and precision.     Time  4    Period  Weeks    Status  On-going      OT SHORT TERM GOAL #4   Title  Patient will increase his grip strength in his left hand by 10lbs and pinch strength by 5# in order to hold onto items with less difficulty.     Time  4    Period  Weeks    Status  On-going               Plan - 08/14/18 1205    Clinical Impression Statement  A: Focused on shoulder and scapular strengthening at beginning of session. patient was able to complete shoulder strengthening using 2# hand weights this session. VC to keep elbow extended when muscle fatigue occurs. Pt presented with mild difficulty with coordination task. Increased to blue resisitve clothespin to pick up sponges as green was not difficult. Patient did require one rest break.    Plan  P: Reassess for MD visit. Determine if ready for discharge.     Consulted and Agree with Plan of Care  Patient       Patient will benefit from skilled therapeutic intervention in order to improve the following deficits and impairments:  Decreased coordination, Impaired UE functional use, Decreased strength  Visit Diagnosis: Other symptoms and signs involving the musculoskeletal system  Other lack of coordination    Problem List Patient Active Problem List   Diagnosis Date Noted  . Essential hypertension 07/22/2018  . Hyperlipidemia 07/22/2018  . CKD (chronic kidney disease) 07/22/2018  . Anemia likely d/t chronic kidney disease 07/22/2018  . CVA (cerebral vascular accident) ([place]) - R MCA  s/p tPA, embolic, unknown  source 07/19/2018   [place] Boyle, OTR/L,CBIS  [phone]  08/14/2018, 12:09 PM  [place], [place], [address] Phone: [phone]   Fax:  [phone]  Name: Cameron Boyle MRN: [id number] Date of Birth: 05-Nov-1935

## 2018-08-19 ENCOUNTER — Ambulatory Visit (HOSPITAL_COMMUNITY): Payer: Medicare PPO | Attending: Neurology

## 2018-08-19 ENCOUNTER — Encounter (HOSPITAL_COMMUNITY): Payer: Self-pay

## 2018-08-19 DIAGNOSIS — R278 Other lack of coordination: Secondary | ICD-10-CM | POA: Diagnosis present

## 2018-08-19 DIAGNOSIS — R29898 Other symptoms and signs involving the musculoskeletal system: Secondary | ICD-10-CM | POA: Insufficient documentation

## 2018-08-19 NOTE — Therapy (Signed)
[place], [place], [address] Phone: [phone]   Fax:  [phone]  Occupational Therapy Treatment Reassess/discharge Patient Details  Name: Cameron Boyle MRN: [id number] Date of Birth: 10/03/1935 Referring Provider (OT): Rosalin Hawking, MD   Encounter Date: 08/19/2018  OT End of Session - 08/19/18 1018    Visit Number  6    Number of Visits  8    Authorization Type  [employer]     Authorization Time Period  $15 co pay No visit limit    OT Start Time  0947   reassessment and discharge   OT Stop Time  1015    OT Time Calculation (min)  28 min    Activity Tolerance  Patient tolerated treatment well    Behavior During Therapy  [facility] for tasks assessed/performed       Past Medical History:  Diagnosis Date  . Arthritis    left knee    Past Surgical History:  Procedure Laterality Date  . APPENDECTOMY    . left knee surgery    . LOOP RECORDER INSERTION N/A 07/22/2018   Procedure: LOOP RECORDER INSERTION;  Surgeon: Evans Lance, MD;  Location: [facility];  Service: Cardiovascular;  Laterality: N/A;  . TEE WITHOUT CARDIOVERSION N/A 07/22/2018   Procedure: TRANSESOPHAGEAL ECHOCARDIOGRAM (TEE);  Surgeon: Dorothy Spark, MD;  Location: [facility];  Service: Cardiovascular;  Laterality: N/A;  . TONSILLECTOMY      There were no vitals filed for this visit.  Subjective Assessment - 08/19/18 1017    Subjective   S: The numbness is what stops me.    Special Tests  FOTO score: 97/100    Currently in Pain?  No/denies         [facility] OT Assessment - 08/19/18 0951      Assessment   Medical Diagnosis  left side weakness      Precautions   Precautions  Other (comment)    Precaution Comments  left side weakness      Coordination   9 Hole Peg Test  Left    Left 9 Hole Peg Test  55.1"   previous: 1'22"   Box and Blocks  Left: 30      Strength   Overall Strength Comments  Assessed seated. IR/er  adducted    Strength Assessment Site  Shoulder;Elbow;Hand    Right/Left Shoulder  Left    Left Shoulder Flexion  5/5   previous; 4/5   Left Shoulder ABduction  5/5   previous: 4/5   Left Shoulder Internal Rotation  5/5   previous: 4+/5   Left Shoulder External Rotation  4+/5   previous: same   Right/Left Elbow  --    Right/Left hand  Left    Left Hand Grip (lbs)  45   previous: 40   Left Hand Lateral Pinch  15 lbs   previous: 12   Left Hand 3 Point Pinch  22 lbs   previous: 12                      OT Education - 08/19/18 1017    Education Details  Reviewed progress in therapy. Reviewed goals and HEP. Pt provided with green theraputty to upgrade to once Red becomes easy. patient is to continue all previously established exercises.     Person(s) Educated  Patient    Methods  Explanation    Comprehension  Verbalized understanding  OT Short Term Goals - 08/19/18 1002      OT SHORT TERM GOAL #1   Title  Patient will return to highest level of independence while using his LUE as his dominant extremity for all daily tasks.     Time  4    Period  Weeks    Status  Achieved      OT SHORT TERM GOAL #2   Title  Patient will increase LUE strength to 5/5 in shoulder in order to complete sustained functional reaching tasks overhead without increased effort.     Time  4    Period  Weeks    Status  Achieved      OT SHORT TERM GOAL #3   Title  Patient will increase his fine and gross motor coordination by completing the 9 hole peg test in 32 seconds or less and being able to move 25 blocks in 1 minute during box and blocks test in order to complete daily tasks with increased motor control and precision.     Time  4    Period  Weeks    Status  Partially Met      OT SHORT TERM GOAL #4   Title  Patient will increase his grip strength in his left hand by 10lbs and pinch strength by 5# in order to hold onto items with less difficulty.     Time  4    Period  Weeks     Status  Partially Met               Plan - 08/19/18 1019    Clinical Impression Statement  A: Reassessment completed for MD appointment tomorrow. Patient reports that he is using his left hand for all daily tasks. He reports that when self feeding and holding an eating utensil in his left hand he notices that his control isn't 100%. Pt encouraged to continue working on HEP and motor coordination tasks at home as this will increase his control. Pt has met 2/4 therapy goals with the remaining goals partially met. Pt feels that he is able to continue working on areas of weakness independently at home and is in agreement with discharge recommendation. HEP was reviewed.     Plan  P: D/C from OT services with HEP. patient is to follow up with MD tomorrow 08/20/18.    Consulted and Agree with Plan of Care  Patient       Patient will benefit from skilled therapeutic intervention in order to improve the following deficits and impairments:  Decreased coordination, Impaired UE functional use, Decreased strength  Visit Diagnosis: Other symptoms and signs involving the musculoskeletal system  Other lack of coordination    Problem List Patient Active Problem List   Diagnosis Date Noted  . Essential hypertension 07/22/2018  . Hyperlipidemia 07/22/2018  . CKD (chronic kidney disease) 07/22/2018  . Anemia likely d/t chronic kidney disease 07/22/2018  . 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  Patient goals were partially met. Patient is being discharged due to being pleased with the current functional level.  ?????          [name], OTR/L,CBIS   [phone]  08/19/2018, 10:28 AM  [place], [place], [address] Phone: [phone]   Fax:  [phone]  Name: Cameron Boyle MRN: [id number] Date of Birth: 1936-02-07

## 2018-08-20 ENCOUNTER — Encounter: Payer: Self-pay | Admitting: Neurology

## 2018-08-20 ENCOUNTER — Ambulatory Visit: Payer: Medicare PPO | Admitting: Neurology

## 2018-08-20 VITALS — BP 143/75 | HR 71 | Ht 70.0 in | Wt 140.0 lb

## 2018-08-20 DIAGNOSIS — I639 Cerebral infarction, unspecified: Secondary | ICD-10-CM

## 2018-08-20 NOTE — Patient Instructions (Signed)
I had a long d/w patient and his son about his recent cryptogenic stroke, risk for recurrent stroke/TIAs, personally independently reviewed imaging studies and stroke evaluation results and answered questions.Continue Plavix for secondary stroke prevention and maintain strict control of hypertension with blood pressure goal below 130/90, diabetes with hemoglobin A1c goal below 6.5% and lipids with LDL cholesterol goal below 70 mg/dL. I also advised the patient to eat a healthy diet with plenty of whole grains, cereals, fruits and vegetables, exercise regularly and maintain ideal body weight Followup in the future with my nurse practitioner Janett Billow in 3 months or call earlier if necessary  Stroke Prevention Some medical conditions and behaviors are associated with a higher chance of having a stroke. You can help prevent a stroke by making nutrition, lifestyle, and other changes, including managing any medical conditions you may have. What nutrition changes can be made?  Eat healthy foods. You can do this by: ? Choosing foods high in fiber, such as fresh fruits and vegetables and whole grains. ? Eating at least 5 or more servings of fruits and vegetables a day. Try to fill half of your plate at each meal with fruits and vegetables. ? Choosing lean protein foods, such as lean cuts of meat, poultry without skin, fish, tofu, beans, and nuts. ? Eating low-fat dairy products. ? Avoiding foods that are high in salt (sodium). This can help lower blood pressure. ? Avoiding foods that have saturated fat, trans fat, and cholesterol. This can help prevent high cholesterol. ? Avoiding processed and premade foods.  Follow your health care provider's specific guidelines for losing weight, controlling high blood pressure (hypertension), lowering high cholesterol, and managing diabetes. These may include: ? Reducing your daily calorie intake. ? Limiting your daily sodium intake to 1,500 milligrams (mg). ? Using only  healthy fats for cooking, such as olive oil, canola oil, or sunflower oil. ? Counting your daily carbohydrate intake. What lifestyle changes can be made?  Maintain a healthy weight. Talk to your health care provider about your ideal weight.  Get at least 30 minutes of moderate physical activity at least 5 days a week. Moderate activity includes brisk walking, biking, and swimming.  Do not use any products that contain nicotine or tobacco, such as cigarettes and e-cigarettes. If you need help quitting, ask your health care provider. It may also be helpful to avoid exposure to secondhand smoke.  Limit alcohol intake to no more than 1 drink a day for nonpregnant women and 2 drinks a day for men. One drink equals 12 oz of beer, 5 oz of wine, or 1 oz of hard liquor.  Stop any illegal drug use.  Avoid taking birth control pills. Talk to your health care provider about the risks of taking birth control pills if: ? You are over 21 years old. ? You smoke. ? You get migraines. ? You have ever had a blood clot. What other changes can be made?  Manage your cholesterol levels. ? Eating a healthy diet is important for preventing high cholesterol. If cholesterol cannot be managed through diet alone, you may also need to take medicines. ? Take any prescribed medicines to control your cholesterol as told by your health care provider.  Manage your diabetes. ? Eating a healthy diet and exercising regularly are important parts of managing your blood sugar. If your blood sugar cannot be managed through diet and exercise, you may need to take medicines. ? Take any prescribed medicines to control your diabetes as told  by your health care provider.  Control your hypertension. ? To reduce your risk of stroke, try to keep your blood pressure below 130/80. ? Eating a healthy diet and exercising regularly are an important part of controlling your blood pressure. If your blood pressure cannot be managed through  diet and exercise, you may need to take medicines. ? Take any prescribed medicines to control hypertension as told by your health care provider. ? Ask your health care provider if you should monitor your blood pressure at home. ? Have your blood pressure checked every year, even if your blood pressure is normal. Blood pressure increases with age and some medical conditions.  Get evaluated for sleep disorders (sleep apnea). Talk to your health care provider about getting a sleep evaluation if you snore a lot or have excessive sleepiness.  Take over-the-counter and prescription medicines only as told by your health care provider. Aspirin or blood thinners (antiplatelets or anticoagulants) may be recommended to reduce your risk of forming blood clots that can lead to stroke.  Make sure that any other medical conditions you have, such as atrial fibrillation or atherosclerosis, are managed. What are the warning signs of a stroke? The warning signs of a stroke can be easily remembered as BEFAST.  B is for balance. Signs include: ? Dizziness. ? Loss of balance or coordination. ? Sudden trouble walking.  E is for eyes. Signs include: ? A sudden change in vision. ? Trouble seeing.  F is for face. Signs include: ? Sudden weakness or numbness of the face. ? The face or eyelid drooping to one side.  A is for arms. Signs include: ? Sudden weakness or numbness of the arm, usually on one side of the body.  S is for speech. Signs include: ? Trouble speaking (aphasia). ? Trouble understanding.  T is for time. ? These symptoms may represent a serious problem that is an emergency. Do not wait to see if the symptoms will go away. Get medical help right away. Call your local emergency services (911 in the [place] Do not drive yourself to the hospital.  Other signs of stroke may include: ? A sudden, severe headache with no known cause. ? Nausea or vomiting. ? Seizure.  Where to find more  information: For more information, visit:  American Stroke Association: www.strokeassociation.org  National Stroke Association: www.stroke.org  Summary  You can prevent a stroke by eating healthy, exercising, not smoking, limiting alcohol intake, and managing any medical conditions you may have.  Do not use any products that contain nicotine or tobacco, such as cigarettes and e-cigarettes. If you need help quitting, ask your health care provider. It may also be helpful to avoid exposure to secondhand smoke.  Remember BEFAST for warning signs of stroke. Get help right away if you or a loved one has any of these signs. This information is not intended to replace advice given to you by your health care provider. Make sure you discuss any questions you have with your health care provider. Document Released: 10/12/2004 Document Revised: 10/10/2016 Document Reviewed: 10/10/2016 Elsevier Interactive Patient Education  [employer].

## 2018-08-20 NOTE — Progress Notes (Signed)
[facility] [address] [place] [address] [phone]       OFFICE CONSULT NOTE  Cameron. LARENZ FRASIER Date of Birth:  10/25/1935 Medical Record Number:  [id number]   Referring MD: Dr. Erlinda Hong Reason for Referral: Stroke  HPI: Cameron Boyle is a pleasant 82 year old male seen today for initial office consultation visit following a recent stroke in November 2019.  History is obtained from the patient, review of electronic medical records and have personally reviewed imaging films and stroke work-up.  He presented on 07/19/2018 with sudden onset of left-sided weakness at 8 AM.  He presented to the emergency room and was evaluated by telemetry neurology and NIH stroke scale was found to be 3.  He was given IV TPA and reported significant improvement in his left-sided weakness.  He was transferred to [facility].  And was completely asymptomatic by the time he reached there.  CT scan of the head on admission showed no acute abnormalities.  MRI scan of the brain showed patchy right frontal MCA branch infarcts.  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  Carotid ultrasound showed no significant extracranial stenosis.  Transthoracic echo showed normal ejection fraction without cardiac source of embolism.  Transesophageal echocardiogram also did not reveal any cardiac source of embolism or PFO.  Lower extremity venous Dopplers were negative for DVT.  Urine drug screen was negative.  LDL cholesterol was 76 mg percent.  Hemoglobin A1c was 5.1.  Patient had loop recorder inserted on 07/22/2018 and so for paroxysmal atrial fibrillation has not yet been found.  He was discharged on dual antiplatelet therapy and has stopped aspirin and currently is on Plavix alone.  He states is done well.  He still has some diminished fine motor skills and numbness in his left hand but it is improving.  He is finished outpatient  physical and occupational therapy recently.  He is tolerating Plavix well without bleeding or bruising.  He is also tolerating Lipitor well without muscle aches and pains.  His blood pressure is been borderline and today it is 143/75.  He has no complaints today.  He has no prior history of strokes or TIAs.  He does not smoke or drink.  There is no family history of strokes.  He is retired and independent in all activities of daily living.  ROS:   14 system review of systems is positive for leg swelling, cough, feeling cold, hand weakness and numbness and all other systems times negative  PMH:  Past Medical History:  Diagnosis Date  . Arthritis    left knee    Social History:  Social History   Socioeconomic History  . Marital status: Married    Spouse name: Not on file  . Number of children: Not on file  . Years of education: Not on file  . Highest education level: Not on file  Occupational History  . Not on file  Social Needs  . Financial resource strain: Not on file  . Food insecurity:    Worry: Not on file    Inability: Not on file  . Transportation needs:    Medical: Not on file    Non-medical: Not on file  Tobacco Use  . Smoking status: Former Research scientist (life sciences)  . Smokeless tobacco: Never Used  Substance and Sexual Activity  . Alcohol use: No  . Drug use: No  . Sexual activity: Not on file  Lifestyle  . Physical activity:    Days per week: Not on file    Minutes per session: Not on file  . Stress: Not on file  Relationships  . Social connections:    Talks on phone: Not on file    Gets together: Not on file    Attends religious service: Not on file    Active member of club or organization: Not on file    Attends meetings of clubs or organizations: Not on file    Relationship status: Not on file  . Intimate partner violence:    Fear of current or ex partner: Not on file    Emotionally abused: Not on file    Physically abused: Patient refused    Forced sexual activity:  Not on file  Other Topics Concern  . Not on file  Social History Narrative  . Not on file    Medications:   Current Outpatient Medications on File Prior to Visit  Medication Sig Dispense Refill  . amLODipine (NORVASC) 10 MG tablet Take 1 tablet (10 mg total) by mouth daily. 30 tablet 2  . atorvastatin (LIPITOR) 20 MG tablet Take 1 tablet (20 mg total) by mouth daily at 6 PM. 30 tablet 2  . clopidogrel (PLAVIX) 75 MG tablet Take 1 tablet (75 mg total) by mouth daily. 30 tablet 2  . hydrALAZINE (APRESOLINE) 25 MG tablet Take 1 tablet (25 mg total) by mouth every 8 (eight) hours. 90 tablet 2  . HYDROcodone-acetaminophen (NORCO) 5-325 MG per tablet 1/2 to 1 tablet by mouth every 4 hours prn pain 20 tablet 0  . Multiple Vitamins-Minerals (MENS MULTIPLUS PO) Take 1 tablet by mouth daily.      . Multiple Vitamin (MULTI-VITAMINS) TABS Take by mouth.     No current facility-administered medications on file prior to visit.     Allergies:  No Known Allergies  Physical Exam General: frail elderly caucasian amle seated, in no evident distress Head: head normocephalic and atraumatic.   Neck: supple with no carotid or supraclavicular bruits Cardiovascular: regular rate and rhythm, no murmurs Musculoskeletal: no deformity Skin:  no rash/petichiae Vascular:  Normal pulses all extremities  Neurologic Exam Mental Status: Awake and fully alert. Oriented to place and time. Recent and remote memory intact. Attention span, concentration and fund of knowledge appropriate. Mood and affect appropriate.  Cranial Nerves: Fundoscopic exam reveals sharp disc margins. Pupils equal, briskly reactive to light. Extraocular movements full without nystagmus. Visual fields full to confrontation. Hearing intact. Facial sensation intact. Face, tongue, palate moves normally and symmetrically.  Motor: Normal bulk and tone. Normal strength in all tested extremity muscles.  Diminished fine finger movements on the left.   Orbits right over left upper extremity. Sensory.: intact to touch , pinprick , position and vibratory sensation.  Coordination: Rapid alternating movements normal in all extremities. Finger-to-nose and heel-to-shin performed accurately bilaterally. Gait and Station: Arises from chair without difficulty. Stance is normal. Gait demonstrates slight limping and dragging of the left leg. Able to heel, toe and tandem walk with moderate difficulty.  Reflexes: 1+ and symmetric. Toes downgoing.   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  Vascular risk factors of hypertension and hyperlipidemia only.     PLAN: I had a long d/w patient and his son about his recent cryptogenic stroke, risk for recurrent stroke/TIAs,  personally independently reviewed imaging studies and stroke evaluation results and answered questions.Continue Plavix for secondary stroke prevention and maintain strict control of hypertension with blood pressure goal below 130/90, diabetes with hemoglobin A1c goal below 6.5% and lipids with LDL cholesterol goal below 70 mg/dL. I also advised the patient to eat a healthy diet with plenty of whole grains, cereals, fruits and vegetables, exercise regularly and maintain ideal body weight greater than 50% time during this 45-minute consultation visit was spent on counseling and coordination of care about his cryptogenic stroke and discussion about stroke prevention and treatment and answering questions..Followup in the future with my nurse practitioner Janett Billow in 3 months or call earlier if necessary Antony Contras, MD  [facility] [address] [place], [place] [address]  Phone [phone] Fax [phone] Note: This document was prepared with digital dictation and possible smart phrase technology. Any  transcriptional errors that result from this process are unintentional.

## 2018-08-26 ENCOUNTER — Encounter (HOSPITAL_COMMUNITY): Payer: Medicare PPO

## 2018-08-26 ENCOUNTER — Ambulatory Visit (INDEPENDENT_AMBULATORY_CARE_PROVIDER_SITE_OTHER): Payer: Medicare PPO

## 2018-08-26 ENCOUNTER — Ambulatory Visit (HOSPITAL_COMMUNITY): Payer: Medicare PPO | Admitting: Physical Therapy

## 2018-08-26 DIAGNOSIS — I639 Cerebral infarction, unspecified: Secondary | ICD-10-CM

## 2018-08-27 NOTE — Progress Notes (Signed)
Carelink Summary Report / Loop Recorder 

## 2018-08-28 ENCOUNTER — Ambulatory Visit (HOSPITAL_COMMUNITY): Payer: Medicare PPO | Admitting: Physical Therapy

## 2018-08-28 ENCOUNTER — Encounter (HOSPITAL_COMMUNITY): Payer: Medicare PPO | Admitting: Occupational Therapy

## 2018-09-02 ENCOUNTER — Encounter (HOSPITAL_COMMUNITY): Payer: Medicare PPO | Admitting: Occupational Therapy

## 2018-09-02 ENCOUNTER — Ambulatory Visit (HOSPITAL_COMMUNITY): Payer: Medicare PPO | Admitting: Physical Therapy

## 2018-09-04 ENCOUNTER — Encounter (HOSPITAL_COMMUNITY): Payer: Medicare PPO

## 2018-09-04 ENCOUNTER — Ambulatory Visit (HOSPITAL_COMMUNITY): Payer: Medicare PPO

## 2018-09-05 ENCOUNTER — Telehealth: Payer: Self-pay

## 2018-09-05 ENCOUNTER — Telehealth (HOSPITAL_COMMUNITY): Payer: Self-pay | Admitting: Speech Pathology

## 2018-09-05 NOTE — Telephone Encounter (Signed)
MBSS ref at MD office per Daleen Snook and Md will return 09/20/18. She will have MD sign and fax back at that time.NF 08/1918

## 2018-09-05 NOTE — Telephone Encounter (Signed)
RN call [facility] about getting a order for modified barium swallowing test. Rn spoke with Nancy(scheduler and insurance authorization) stated the form has Dr .Erlinda Hong name on it. Rn stated it should have Dr. Leonie Man. PT was seen one time by Dr. Leonie Man last month. Izora Gala who schedule the test stated they are done certain days of the week. Her schedule is book till the first of the year. Rn stated Dr. Leonie Man will be back on January 3,2020. Izora Gala stated it can wait to be sign once he returns from vacation. She does not have any open slots till after September 20, 2018. Rn stated it will be given to Dr. Leonie Man. Izora Gala stated it can be fax back to [phone] to her attention.

## 2018-09-09 ENCOUNTER — Ambulatory Visit (HOSPITAL_COMMUNITY): Payer: Medicare PPO | Admitting: Physical Therapy

## 2018-09-09 ENCOUNTER — Ambulatory Visit (HOSPITAL_COMMUNITY): Payer: Medicare PPO | Admitting: Specialist

## 2018-09-10 ENCOUNTER — Encounter (HOSPITAL_COMMUNITY): Payer: Medicare PPO

## 2018-09-10 ENCOUNTER — Ambulatory Visit (HOSPITAL_COMMUNITY): Payer: Medicare PPO | Admitting: Physical Therapy

## 2018-09-20 NOTE — Telephone Encounter (Signed)
Dr.Sethi signed form for modified barium swallowing test to [phone]. Form fax twice and confirmed to Attention Izora Gala at [facility] outpatient rehab.

## 2018-09-25 ENCOUNTER — Other Ambulatory Visit (HOSPITAL_COMMUNITY): Payer: Self-pay | Admitting: Specialist

## 2018-09-25 DIAGNOSIS — R1319 Other dysphagia: Secondary | ICD-10-CM

## 2018-09-26 ENCOUNTER — Ambulatory Visit (HOSPITAL_COMMUNITY): Payer: Medicare Other | Attending: Neurology | Admitting: Speech Pathology

## 2018-09-26 ENCOUNTER — Encounter (HOSPITAL_COMMUNITY): Payer: Self-pay | Admitting: Speech Pathology

## 2018-09-26 ENCOUNTER — Ambulatory Visit (INDEPENDENT_AMBULATORY_CARE_PROVIDER_SITE_OTHER): Payer: Medicare Other

## 2018-09-26 ENCOUNTER — Other Ambulatory Visit: Payer: Self-pay

## 2018-09-26 ENCOUNTER — Ambulatory Visit (HOSPITAL_COMMUNITY)
Admission: RE | Admit: 2018-09-26 | Discharge: 2018-09-26 | Disposition: A | Discharge status: Home / Self Care | Payer: Medicare Other | Source: Ambulatory Visit | Attending: Internal Medicine | Admitting: Internal Medicine

## 2018-09-26 DIAGNOSIS — R1319 Other dysphagia: Secondary | ICD-10-CM

## 2018-09-26 DIAGNOSIS — I639 Cerebral infarction, unspecified: Secondary | ICD-10-CM

## 2018-09-26 DIAGNOSIS — R1312 Dysphagia, oropharyngeal phase: Secondary | ICD-10-CM | POA: Diagnosis present

## 2018-09-26 NOTE — Therapy (Signed)
[place], [place], [address] Phone: [phone]   Fax:  [phone]  Modified Barium Swallow  Patient Details  Name: Cameron Boyle MRN: [id number] Date of Birth: [birth year] No data recorded  Encounter Date: 09/26/2018  End of Session - 09/26/18 1524    Visit Number  1    Number of Visits  9    Date for SLP Re-Evaluation  10/31/18    Authorization Type  Medicare [employer]    SLP Start Time  1340    SLP Stop Time   1410    SLP Time Calculation (min)  30 min    Activity Tolerance  Patient tolerated treatment well       Past Medical History:  Diagnosis Date  . Arthritis    left knee    Past Surgical History:  Procedure Laterality Date  . APPENDECTOMY    . left knee surgery    . LOOP RECORDER INSERTION N/A 07/22/2018   Procedure: LOOP RECORDER INSERTION;  Surgeon: Evans Lance, MD;  Location: [place] [facility];  Service: Cardiovascular;  Laterality: N/A;  . TEE WITHOUT CARDIOVERSION N/A 07/22/2018   Procedure: TRANSESOPHAGEAL ECHOCARDIOGRAM (TEE);  Surgeon: Dorothy Spark, MD;  Location: [facility] ENDOSCOPY;  Service: Cardiovascular;  Laterality: N/A;  . TONSILLECTOMY      There were no vitals filed for this visit.  Subjective Assessment - 09/26/18 1515    Subjective  "I don't have any problems swallowing."    Special Tests  MBSS    Currently in Pain?  No/denies        General - 09/26/18 1515      General Information   Date of Onset  07/18/18    HPI  [name] is an 83 yo male who was referred by Dr. Leonie Man for MBSS due to PT noted Pt coughing excessively with thin liquids during PT evaluation. 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 Pt was transferred from [facility] ED to [facility] for further work up. Patient received acute care OT and PT services while admitted. He was discharged home without therapy.     Type of Study  MBS-Modified Barium Swallow Study     Diet Prior to this Study  Regular;Thin liquids    Temperature Spikes Noted  No    Respiratory Status  Room air    History of Recent Intubation  No    Behavior/Cognition  Alert;Cooperative    Oral Cavity Assessment  Within Functional Limits    Oral Care Completed by SLP  No    Oral Cavity - Dentition  Adequate natural dentition    Vision  Functional for self feeding    Self-Feeding Abilities  Able to feed self    Patient Positioning  Upright in chair    Baseline Vocal Quality  Wet    Volitional Cough  Strong;Congested    Volitional Swallow  Able to elicit    Anatomy  Within functional limits   cervical bony prominences   Pharyngeal Secretions  Not observed secondary MBS         Oral Preparation/Oral Phase - 09/26/18 1517      Oral Preparation/Oral Phase   Oral Phase  Impaired      Oral - Thin   Oral - Thin Cup  --   premature spillage until cued     Electrical stimulation - Oral Phase   Was Electrical  Stimulation Used  No       Pharyngeal Phase - 09/26/18 1517      Pharyngeal Phase   Pharyngeal Phase  Impaired      Pharyngeal - Honey   Pharyngeal- Honey Cup  Swallow initiation at vallecula;Reduced pharyngeal peristalsis;Reduced epiglottic inversion;Reduced airway/laryngeal closure;Reduced tongue base retraction;Pharyngeal residue - valleculae;Pharyngeal residue - pyriform      Pharyngeal - Nectar   Pharyngeal- Nectar Cup  Swallow initiation at vallecula;Reduced pharyngeal peristalsis;Reduced epiglottic inversion;Reduced airway/laryngeal closure;Reduced tongue base retraction;Penetration/Aspiration during swallow;Pharyngeal residue - valleculae;Pharyngeal residue - pyriform    Pharyngeal  Material enters airway, CONTACTS cords and not ejected out      Pharyngeal - Thin   Pharyngeal- Thin Cup  Swallow initiation at pyriform sinus;Reduced pharyngeal peristalsis;Reduced epiglottic inversion;Reduced airway/laryngeal closure;Reduced tongue base  retraction;Penetration/Aspiration during swallow;Penetration/Apiration after swallow;Moderate aspiration;Trace aspiration;Significant aspiration (Amount);Pharyngeal residue - valleculae;Pharyngeal residue - pyriform;Pharyngeal residue - cp segment    Pharyngeal  Material does not enter airway;Material enters airway, remains ABOVE vocal cords then ejected out;Material enters airway, remains ABOVE vocal cords and not ejected out;Material enters airway, passes BELOW cords without attempt by patient to eject out (silent aspiration);Material enters airway, passes BELOW cords and not ejected out despite cough attempt by patient      Pharyngeal - Solids   Pharyngeal- Puree  Swallow initiation at vallecula;Reduced pharyngeal peristalsis;Reduced epiglottic inversion;Reduced tongue base retraction;Pharyngeal residue - valleculae;Pharyngeal residue - posterior pharnyx;Pharyngeal residue - cp segment;Pharyngeal residue - pyriform    Pharyngeal- Regular  Delayed swallow initiation-vallecula;Reduced pharyngeal peristalsis;Reduced epiglottic inversion;Reduced tongue base retraction;Pharyngeal residue - valleculae;Pharyngeal residue - pyriform;Pharyngeal residue - cp segment    Pharyngeal- Pill  Pharyngeal residue - cp segment      Electrical Stimulation - Pharyngeal Phase   Was Electrical Stimulation Used  No       Cricopharyngeal Phase - 09/26/18 1522      Cervical Esophageal Phase   Cervical Esophageal Phase  Impaired      Cervical Esophageal Phase - Thin   Thin Cup  Prominent cricopharyngeal segment;Reduced cricopharyngeal relaxation      Cervical Esophageal Phase - Solids   Pill  Prominent cricopharyngeal segment;Reduced cricopharyngeal relaxation      Cervical Esophageal Phase - Comment   Cervical Esophageal Comment  barium tablet eventually passed through UES after head turn to the RIGHT with repeat swallows of puree           SLP Short Term Goals - 09/26/18 1533      SLP SHORT TERM GOAL #1    Title  Pt will complete pharyngeal swallowing exercises as assigned by SLP after demonstration and use of written cues 3x daily for 10 minutes each by Pt report    Baseline  Pt does not have exercises yet    Time  4    Period  Weeks    Status  New    Target Date  10/31/18      SLP SHORT TERM GOAL #2   Title  Pt will implement compensatory swallowing strategies greater than 90% of the time during po trials in treatment with min cues (head turn RIGHT and dry swallows).    Baseline  not completing yet    Time  4    Period  Weeks    Status  New    Target Date  10/31/18         Plan - 09/26/18 1525    Clinical Impression Statement  Pt presents with mod/severe sensorimotor based pharyngeal phase dysphagia  characterized by severely reduced pharyngeal pressure with reduced tongue based retraction, epiglottic deflection, posterior pharyngeal wall approximation, and laryngeal vestibule closure resulting in penetration/aspiration during and after the swallow with thin liquids and mod/severe pharyngeal residue post swallow. 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 Many strategies were trialed and the most effective was for Pt to turn his head to the RIGHT and swallow several times, occasionally alternating with head turn LEFT (worse with head center). Pt repotedly has been consuming a regular diet with thin liquids since his stroke at the end of October without diagnosis of upper respiratory infections or PNA. Pt's vocal quality is wet and is he unaware of this. Recommend D3/mech soft textures with thin liquids via cup sip with head turn to the RIGHT for all swallows, good oral care before and after meals, and dysphagia therapy. Pt is agreeable to plan of care.     Speech Therapy Frequency  2x / week    Duration  4 weeks    Treatment/Interventions  Pharyngeal strengthening exercises;Trials of upgraded  texture/liquids;Compensatory strategies;Patient/family education;Diet toleration management by SLP;SLP instruction and feedback;Compensatory techniques    Potential to Achieve Goals  Fair    Potential Considerations  Severity of impairments    SLP Home Exercise Plan  Pt will complete HEP as assigned to facilitate carryover of treatment strategies and techniques in home environment.    Consulted and Agree with Plan of Care  Patient       Patient will benefit from skilled therapeutic intervention in order to improve the following deficits and impairments:   Dysphagia, oropharyngeal phase    Recommendations/Treatment - 09/26/18 1523      Swallow Evaluation Recommendations   SLP Diet Recommendations  Dysphagia 3 (mechanical soft);Thin;Nectar   Pt has been consuming thin since stroke in October   Liquid Administration via  Cup;No straw    Medication Administration  Whole meds with puree    Supervision  Patient able to self feed    Compensations  Multiple dry swallows after each bite/sip;Follow solids with liquid;Other (Comment)   Head turn RIGHT and repeat swallow   Postural Changes  Seated upright at 90 degrees;Remain upright for at least 30 minutes after feeds/meals         Problem List Patient Active Problem List   Diagnosis Date Noted  . Essential hypertension 07/22/2018  . Hyperlipidemia 07/22/2018  . CKD (chronic kidney disease) 07/22/2018  . Anemia likely d/t chronic kidney disease 07/22/2018  . CVA (cerebral vascular accident) [facility]) - R MCA s/p tPA, embolic, unknown source 16/06/9603   Thank you,  [name] Churn, [place]   [facility] 09/26/2018, 3:38 PM  [place], [place], [address] Phone: [phone]   Fax:  [phone]  Name: MONTEL VANDERHOOF MRN: [id number] Date of Birth: 09-Dec-1935

## 2018-09-27 ENCOUNTER — Other Ambulatory Visit: Payer: Self-pay | Admitting: *Deleted

## 2018-09-27 NOTE — Progress Notes (Signed)
Carelink Summary Report / Loop Recorder 

## 2018-09-27 NOTE — Patient Outreach (Signed)
[facility]) Care Management  09/27/2018  [name] 1935/12/20 [id number]   EMMI-stroke    RED ON EMMI ALERT Day # 1 Date: Wednesday 07/24/18 1306   Red Alert Reason:  Feeling worse overall? Yes  Scheduled a follow up appointment? No   Outreach attempt # 2  No answer. [facility] RN CM left HIPAA compliant voicemail message along with CM's contact info.   Plan: [facility] RN CM scheduled this patient for another call attempt within 4 business days    Cameron Shackleton L. Lavina Hamman, RN, BSN, [place] Coordinator Office number [phone] Mobile number [phone]  Main [facility] number [phone] Fax number [phone]

## 2018-09-28 LAB — CUP PACEART REMOTE DEVICE CHECK
Implantable Pulse Generator Implant Date: 20191104
MDC IDC SESS DTM: 20200109191053

## 2018-09-30 ENCOUNTER — Other Ambulatory Visit: Payer: Medicare Other | Admitting: *Deleted

## 2018-09-30 NOTE — Patient Outreach (Signed)
[facility]) Care Management  09/30/2018  BRAYLAN FAUL 07/12/1936 [id number]   EMMI-stroke   RED ON EMMI ALERT Day #1 Date:Wednesday 07/24/18 1306  Red Alert Reason: Feeling worse overall? Yes  Scheduled a follow up appointment? No  Outreach attempt # 3  No answer. [facility] RN CM left HIPAA compliant voicemail message along with CM's contact info.   Plan: [facility] RN CM scheduled this patient for case closure within 4 business days    Esias Mory L. Lavina Hamman, RN, BSN, [place] Coordinator Office number [phone] Mobile number [phone]  Main [facility] number [phone] Fax number [phone]

## 2018-09-30 NOTE — Patient Outreach (Signed)
[facility]) Care Management  09/30/2018  Cameron Boyle 1936/08/26 [id number]   EMMI-stroke   RED ON EMMI ALERT Day #1 Date:Wednesday 07/24/18 1306  Red Alert Reason: Feeling worse overall? Yes  Scheduled a follow up appointment? No  Patient returned a call to [facility] RN CM Patient is able to verify HIPAA Reviewed and addressed EMMI red alert/referral to [facility] with patient Cameron Boyle denies receiving the outreach letter but did receive CM's call He states he did not call back because he was doing ok He denies the answers to the EMMI questions He states he was not feeling worse and he has attended all scheduled f/u appointments to include therapies (Pt, OT) He reports being released from therapies and seeing his primary MD last week without issues. Cm assessed s/s and offered education but the offer was refused. "I'm doing good" He is now being seen by speech therapy for noted issues with swallowing of thin liquids Recommendations are for turning of the head to the right with swallowing and use of mechanical soft diet with thin liquids   Social: Lives at home with his wife He is independent/assist with all care Denies transportation needs  Conditions: CVA-s/p right MCA with left side weakness 07/18/18, HTN, CKD, HDL, anemia likely d/t chronic kidney disease, arthritis of left knee  Loop recorder 07/22/18  Medications: denies concerns with taking medications as prescribed, affording medications, side effects of medications and questions about medications  Appointments: 10/29/2018 Loop recorder check  11/21/2018 Neurology appointment with NP J Barada Directives: Denies need for assist with advance directives    Consent: [facility] RN CM reviewed [facility] services with patient. Patient gave verbal consent for services.  [facility] RN CM will close case at this time as patient has been assessed and no needs identified/needs resolved.   Pt encouraged to return a call to  [place] CM prn  [facility] RN CM sent a successful outreach letter as discussed with [facility] brochure enclosed for review  Cameron Oran L. Lavina Hamman, RN, BSN, [place] Coordinator Office number [phone] Mobile number [phone]  Main [facility] number [phone] Fax number [phone]

## 2018-10-02 ENCOUNTER — Encounter (HOSPITAL_COMMUNITY): Payer: Self-pay | Admitting: Speech Pathology

## 2018-10-02 ENCOUNTER — Ambulatory Visit (HOSPITAL_COMMUNITY): Payer: Medicare Other | Admitting: Speech Pathology

## 2018-10-02 ENCOUNTER — Other Ambulatory Visit (HOSPITAL_COMMUNITY): Payer: Self-pay | Admitting: Specialist

## 2018-10-02 DIAGNOSIS — R1312 Dysphagia, oropharyngeal phase: Secondary | ICD-10-CM | POA: Diagnosis not present

## 2018-10-02 DIAGNOSIS — R1319 Other dysphagia: Secondary | ICD-10-CM

## 2018-10-02 NOTE — Therapy (Signed)
[place], [place], [address] Phone: [phone]   Fax:  [phone]  Speech Language Pathology Treatment  Patient Details  Name: Cameron Boyle MRN: [id number] Date of Birth: [birth year] No data recorded  Encounter Date: 10/02/2018  End of Session - 10/02/18 1033    Visit Number  2    Number of Visits  9    Date for SLP Re-Evaluation  10/31/18    Authorization Type  Medicare UHC    SLP Start Time  8413    SLP Stop Time   1015    SLP Time Calculation (min)  42 min    Activity Tolerance  Patient tolerated treatment well       Past Medical History:  Diagnosis Date  . Arthritis    left knee    Past Surgical History:  Procedure Laterality Date  . APPENDECTOMY    . left knee surgery    . LOOP RECORDER INSERTION N/A 07/22/2018   Procedure: LOOP RECORDER INSERTION;  Surgeon: Evans Lance, MD;  Location: [place] [facility];  Service: Cardiovascular;  Laterality: N/A;  . TEE WITHOUT CARDIOVERSION N/A 07/22/2018   Procedure: TRANSESOPHAGEAL ECHOCARDIOGRAM (TEE);  Surgeon: Dorothy Spark, MD;  Location: [facility] ENDOSCOPY;  Service: Cardiovascular;  Laterality: N/A;  . TONSILLECTOMY      There were no vitals filed for this visit.  Subjective Assessment - 10/02/18 1026    Subjective  "I have been coughing when I eat for several years."    Currently in Pain?  No/denies       ADULT SLP TREATMENT - 10/02/18 0001      General Information   Behavior/Cognition  Alert;Cooperative;Pleasant mood    Patient Positioning  Upright in chair    Oral care provided  N/A    HPI  Cameron Boyle is an 83 yo male who was referred by Dr. Leonie Man for MBSS due to PT noted Pt coughing excessively with thin liquids during PT evaluation. 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 Pt was transferred from [facility] ED to [facility] for further work up. Patient received acute care OT and PT services  while admitted. He was discharged home without therapy. MBSS completed last week with recommendation for dysphagia therapy.      Treatment Provided   Treatment provided  Dysphagia      Dysphagia Treatment   Temperature Spikes Noted  No    Respiratory Status  Room air    Oral Cavity - Dentition  Adequate natural dentition    Treatment Methods  Therapeutic exercise;Compensation strategy training;Patient/caregiver education    Patient observed directly with PO's  No    Type of cueing  Verbal;Tactile;Visual    Amount of cueing  Moderate      Pain Assessment   Pain Assessment  No/denies pain      Assessment / Recommendations / Plan   Plan  Continue with current plan of care      Dysphagia Recommendations   Diet recommendations  Regular;Dysphagia 3 (mechanical soft);Thin liquid    Liquids provided via  Cup;No straw    Medication Administration  Whole meds with puree    Supervision  Patient able to self feed    Compensations  Small sips/bites;Multiple dry swallows after each bite/sip;Clear throat after each swallow;Hard cough after swallow    Postural Changes and/or Swallow Maneuvers  Seated upright 90 degrees;Upright 30-60 min after  meal      Progression Toward Goals   Progression toward goals  Progressing toward goals       SLP Education - 10/02/18 1031    Education Details  Swallowing exercises provided in addition to s/sx aspiration PNA    Person(s) Educated  Patient    Methods  Explanation;Demonstration;Handout;Verbal cues;Tactile cues    Comprehension  Verbalized understanding;Need further instruction       SLP Short Term Goals - 10/02/18 1036      SLP SHORT TERM GOAL #1   Title  Pt will complete pharyngeal swallowing exercises as assigned by SLP after demonstration and use of written cues 3x daily for 10 minutes each by Pt report    Baseline  Pt does not have exercises yet    Time  4    Period  Weeks    Status  On-going      SLP SHORT TERM GOAL #2   Title  Pt will  implement compensatory swallowing strategies greater than 90% of the time during po trials in treatment with min cues (head turn RIGHT and dry swallows).    Baseline  not completing yet    Time  4    Period  Weeks    Status  On-going         Plan - 10/02/18 1035    Clinical Impression Statement Pt seen for dysphagia therapy following his MBSS completed last week. The imaging from MBSS was reviewed at length with Pt in addition to imaging from a "normal swallow". Pt presented to treatment room with wet vocal quality to which he was unaware. He was encouraged to cough and swallow which improved his vocal quality. Pt acknowledged seeing stasis in his pharynx when shown imaging and SLP provided written, visual, and tactile cues for implementation of swallowing strategies. Pt given the following exercises to complete at home: effortful swallow, Mendelsohn, CTAR, and adduction exercises. He was able to return demonstrate, but will need continued feedback in future sessions. Pt was also given printed information regarding dysphagia and aspiration. He was encouraged to review with his wife. Continue dysphagia treatment next session.    Speech Therapy Frequency  2x / week    Duration  4 weeks    Treatment/Interventions  Pharyngeal strengthening exercises;Trials of upgraded texture/liquids;Compensatory strategies;Patient/family education;Diet toleration management by SLP;SLP instruction and feedback;Compensatory techniques    Potential to Achieve Goals  Fair    Potential Considerations  Severity of impairments    SLP Home Exercise Plan  Pt will complete HEP as assigned to facilitate carryover of treatment strategies and techniques in home environment.    Consulted and Agree with Plan of Care  Patient       Patient will benefit from skilled therapeutic intervention in order to improve the following deficits and impairments:   Dysphagia, oropharyngeal phase    Problem List Patient Active Problem List    Diagnosis Date Noted  . Essential hypertension 07/22/2018  . Hyperlipidemia 07/22/2018  . CKD (chronic kidney disease) 07/22/2018  . Anemia likely d/t chronic kidney disease 07/22/2018  . CVA (cerebral vascular accident) [facility]) - R MCA s/p tPA, embolic, unknown source 62/86/3817   Thank you,  [name] Churn, [place]  [facility] 10/02/2018, 10:36 AM  [place], [place], [address] Phone: [phone]   Fax:  [phone]   Name: Cameron Boyle MRN: [id number] Date of Birth: 08-20-1936

## 2018-10-06 LAB — CUP PACEART REMOTE DEVICE CHECK
Date Time Interrogation Session: 20191207191055
MDC IDC PG IMPLANT DT: 20191104

## 2018-10-08 ENCOUNTER — Encounter (HOSPITAL_COMMUNITY): Payer: Self-pay | Admitting: Speech Pathology

## 2018-10-08 ENCOUNTER — Ambulatory Visit (HOSPITAL_COMMUNITY): Payer: Medicare Other | Admitting: Speech Pathology

## 2018-10-08 DIAGNOSIS — R1312 Dysphagia, oropharyngeal phase: Secondary | ICD-10-CM

## 2018-10-08 NOTE — Therapy (Signed)
[place], [place], [address] Phone: [phone]   Fax:  [phone]  Speech Language Pathology Treatment  Patient Details  Name: Cameron Boyle MRN: [id number] Date of Birth: [birth year] No data recorded  Encounter Date: 10/08/2018  End of Session - 10/08/18 1514    Visit Number  3    Number of Visits  9    Date for SLP Re-Evaluation  10/31/18    Authorization Type  Medicare [employer]    SLP Start Time  1430    SLP Stop Time   1506    SLP Time Calculation (min)  36 min    Activity Tolerance  Patient tolerated treatment well       Past Medical History:  Diagnosis Date  . Arthritis    left knee    Past Surgical History:  Procedure Laterality Date  . APPENDECTOMY    . left knee surgery    . LOOP RECORDER INSERTION N/A 07/22/2018   Procedure: LOOP RECORDER INSERTION;  Surgeon: Evans Lance, MD;  Location: [facility];  Service: Cardiovascular;  Laterality: N/A;  . TEE WITHOUT CARDIOVERSION N/A 07/22/2018   Procedure: TRANSESOPHAGEAL ECHOCARDIOGRAM (TEE);  Surgeon: Dorothy Spark, MD;  Location: [facility]OS[facility];  Service: Cardiovascular;  Laterality: N/A;  . TONSILLECTOMY      There were no vitals filed for this visit.  Subjective Assessment - 10/08/18 1434    Subjective  "I had fever."    Currently in Pain?  No/denies       ADULT SLP TREATMENT - 10/08/18 0001      General Information   Behavior/Cognition  Alert;Cooperative;Pleasant mood    Patient Positioning  Upright in chair    Oral care provided  N/A    HPI  Cameron Boyle is an 83 yo male who was referred by Dr. Leonie Man for MBSS due to PT noted Pt coughing excessively with thin liquids during PT evaluation. 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 Pt was transferred from [facility] ED to [facility] for further work up. Patient received acute care OT and PT services while admitted. He was discharged home  without therapy. MBSS completed last week with recommendation for dysphagia therapy.      Treatment Provided   Treatment provided  Dysphagia      Dysphagia Treatment   Temperature Spikes Noted  No    Respiratory Status  Room air    Oral Cavity - Dentition  Adequate natural dentition    Treatment Methods  Therapeutic exercise;Compensation strategy training;Patient/caregiver education    Patient observed directly with PO's  Yes    Type of PO's observed  Thin liquids    Feeding  Able to feed self    Liquids provided via  Cup    Pharyngeal Phase Signs & Symptoms  Wet vocal quality;Delayed cough    Type of cueing  Verbal;Tactile;Visual    Amount of cueing  Moderate      Pain Assessment   Pain Assessment  No/denies pain      Assessment / Recommendations / Plan   Plan  Continue with current plan of care      Dysphagia Recommendations   Diet recommendations  Regular;Dysphagia 3 (mechanical soft);Thin liquid    Liquids provided via  Cup;No straw    Medication Administration  Whole meds with puree    Supervision  Patient able to self feed  Compensations  Small sips/bites;Multiple dry swallows after each bite/sip;Clear throat after each swallow;Hard cough after swallow    Postural Changes and/or Swallow Maneuvers  Seated upright 90 degrees;Upright 30-60 min after meal      Progression Toward Goals   Progression toward goals  Progressing toward goals         SLP Short Term Goals - 10/08/18 1514      SLP SHORT TERM GOAL #1   Title  Pt will complete pharyngeal swallowing exercises as assigned by SLP after demonstration and use of written cues 3x daily for 10 minutes each by Pt report    Baseline  Pt does not have exercises yet    Time  4    Period  Weeks    Status  On-going      SLP SHORT TERM GOAL #2   Title  Pt will implement compensatory swallowing strategies greater than 90% of the time during po trials in treatment with min cues (head turn RIGHT and dry swallows).     Baseline  not completing yet    Time  4    Period  Weeks    Status  On-going       SLP Long Term Goals - 10/08/18 1514      SLP LONG TERM GOAL #1   Title  Same as short       Plan - 10/08/18 1514    Clinical Impression Statement  Pt seen for dysphagia therapy. He reported daily execution of swallowing exercises and was able to demonstrate each exercise with only min SLP cues. He continues to present with wet vocal quality and is unware of the same. He does indicate that his wife notices this and has him clear his throat. He endorses occasional loss of saliva from oral cavity and SLP explained likely decreased frequency of swallowing s/p stroke and he was encouraged to clear throat and swallow more frequently. He consumed a few sips of thin water via cup with delayed coughing. He is not turning his head to the right as he says he doesn't think it is making a difference. SLP encouraged Pt to continue with head turn to the right and explained the rationale from MBSS. Continue dysphagia treatment next session.     Speech Therapy Frequency  2x / week    Duration  4 weeks    Treatment/Interventions  Pharyngeal strengthening exercises;Trials of upgraded texture/liquids;Compensatory strategies;Patient/family education;Diet toleration management by SLP;SLP instruction and feedback;Compensatory techniques    Potential to Achieve Goals  Fair    Potential Considerations  Severity of impairments    SLP Home Exercise Plan  Pt will complete HEP as assigned to facilitate carryover of treatment strategies and techniques in home environment.    Consulted and Agree with Plan of Care  Patient       Patient will benefit from skilled therapeutic intervention in order to improve the following deficits and impairments:   Dysphagia, oropharyngeal phase    Problem List Patient Active Problem List   Diagnosis Date Noted  . Essential hypertension 07/22/2018  . Hyperlipidemia 07/22/2018  . CKD (chronic kidney  disease) 07/22/2018  . Anemia likely d/t chronic kidney disease 07/22/2018  . 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

## 2018-10-09 ENCOUNTER — Other Ambulatory Visit: Payer: Self-pay

## 2018-10-09 NOTE — Patient Outreach (Signed)
Telephone outreach to patient to obtain mRs was successfully completed. mRs= 1. Spoke with wife, Letta Median, on [place] to obtain score.

## 2018-10-10 ENCOUNTER — Ambulatory Visit (HOSPITAL_COMMUNITY): Payer: Medicare Other | Admitting: Speech Pathology

## 2018-10-10 ENCOUNTER — Encounter (HOSPITAL_COMMUNITY): Payer: Self-pay | Admitting: Speech Pathology

## 2018-10-10 DIAGNOSIS — R1312 Dysphagia, oropharyngeal phase: Secondary | ICD-10-CM

## 2018-10-10 NOTE — Therapy (Signed)
[place], [place], [address] Phone: [phone]   Fax:  [phone]  Speech Language Pathology Treatment  Patient Details  Name: Cameron Boyle MRN: [id number] Date of Birth: Oct 20, 1935 No data recorded  Encounter Date: 10/10/2018  End of Session - 10/10/18 1503    Visit Number  4    Number of Visits  9    Date for SLP Re-Evaluation  10/31/18    Authorization Type  Medicare [employer]    SLP Start Time  1430    SLP Stop Time   1506    SLP Time Calculation (min)  36 min    Activity Tolerance  Patient tolerated treatment well       Past Medical History:  Diagnosis Date  . Arthritis    left knee    Past Surgical History:  Procedure Laterality Date  . APPENDECTOMY    . left knee surgery    . LOOP RECORDER INSERTION N/A 07/22/2018   Procedure: LOOP RECORDER INSERTION;  Surgeon: Evans Lance, MD;  Location: [place] [facility];  Service: Cardiovascular;  Laterality: N/A;  . TEE WITHOUT CARDIOVERSION N/A 07/22/2018   Procedure: TRANSESOPHAGEAL ECHOCARDIOGRAM (TEE);  Surgeon: Dorothy Spark, MD;  Location: [facility] ENDOSCOPY;  Service: Cardiovascular;  Laterality: N/A;  . TONSILLECTOMY      There were no vitals filed for this visit.  Subjective Assessment - 10/10/18 1438    Subjective  "I have been doing some hollering."    Currently in Pain?  No/denies       ADULT SLP TREATMENT - 10/10/18 0001      General Information   Behavior/Cognition  Alert;Cooperative;Pleasant mood    Patient Positioning  Upright in chair    Oral care provided  N/A    HPI  Cameron Boyle is an 83 yo male who was referred by Dr. Leonie Man for MBSS due to PT noted Pt coughing excessively with thin liquids during PT evaluation. 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 Pt was transferred from [facility] ED to [facility] for further work up. Patient received acute care OT and PT services while admitted. He  was discharged home without therapy. MBSS completed last week with recommendation for dysphagia therapy.      Treatment Provided   Treatment provided  Dysphagia      Dysphagia Treatment   Temperature Spikes Noted  No    Respiratory Status  Room air    Oral Cavity - Dentition  Adequate natural dentition    Treatment Methods  Therapeutic exercise;Compensation strategy training;Patient/caregiver education    Patient observed directly with PO's  Yes    Type of PO's observed  Thin liquids    Feeding  Able to feed self    Liquids provided via  Cup    Pharyngeal Phase Signs & Symptoms  Delayed cough;Immediate cough    Type of cueing  Verbal;Tactile;Visual    Amount of cueing  Moderate      Pain Assessment   Pain Assessment  No/denies pain      Assessment / Recommendations / Plan   Plan  Continue with current plan of care      Dysphagia Recommendations   Diet recommendations  Regular;Dysphagia 3 (mechanical soft);Thin liquid    Liquids provided via  Cup;No straw    Medication Administration  Whole meds with puree    Supervision  Patient able to self feed  Compensations  Small sips/bites;Multiple dry swallows after each bite/sip;Clear throat after each swallow;Hard cough after swallow    Postural Changes and/or Swallow Maneuvers  Seated upright 90 degrees;Upright 30-60 min after meal      Progression Toward Goals   Progression toward goals  Progressing toward goals         SLP Short Term Goals - 10/10/18 1504      SLP SHORT TERM GOAL #1   Title  Pt will complete pharyngeal swallowing exercises as assigned by SLP after demonstration and use of written cues 3x daily for 10 minutes each by Pt report    Baseline  Pt does not have exercises yet    Time  4    Period  Weeks    Status  On-going      SLP SHORT TERM GOAL #2   Title  Pt will implement compensatory swallowing strategies greater than 90% of the time during po trials in treatment with min cues (head turn RIGHT and dry  swallows).    Baseline  not completing yet    Time  4    Period  Weeks    Status  On-going       SLP Long Term Goals - 10/08/18 1514      SLP LONG TERM GOAL #1   Title  Same as short       Plan - 10/10/18 1503    Clinical Impression Statement Pt seen for dysphagia therapy. He reported daily execution of swallowing exercises and was able to demonstrate each exercise with only min SLP cues. He presented with improved vocal quality throughout the session today and he acknowledged a "stronger, clear voice". He also reports success with decreased episodes of loss of saliva on the right side since he has increased frequencies of swallowing. He consumed sips of thin water via cup with immediate and delayed coughing. He is not turning his head to the right as he says he doesn't think it is making a difference. SLP encouraged Pt to continue with head turn to the right and explained the rationale from MBSS. Decreased SLP sessions to once per week given excellent follow through of HEP. Continue dysphagia treatment next session.      Speech Therapy Frequency  2x / week    Duration  4 weeks    Treatment/Interventions  Pharyngeal strengthening exercises;Trials of upgraded texture/liquids;Compensatory strategies;Patient/family education;Diet toleration management by SLP;SLP instruction and feedback;Compensatory techniques    Potential to Achieve Goals  Fair    Potential Considerations  Severity of impairments    SLP Home Exercise Plan  Pt will complete HEP as assigned to facilitate carryover of treatment strategies and techniques in home environment.    Consulted and Agree with Plan of Care  Patient       Patient will benefit from skilled therapeutic intervention in order to improve the following deficits and impairments:   Dysphagia, oropharyngeal phase    Problem List Patient Active Problem List   Diagnosis Date Noted  . Essential hypertension 07/22/2018  . Hyperlipidemia 07/22/2018  . CKD  (chronic kidney disease) 07/22/2018  . Anemia likely d/t chronic kidney disease 07/22/2018  . CVA (cerebral vascular accident) [facility]) - R MCA s/p tPA, embolic, unknown source 77/41/2878   Thank you,  Cameron Boyle Churn, [place]  [facility] 10/10/2018, 3:04 PM  [place], [place], [address] Phone: [phone]   Fax:  [phone]   Name: LUCHIANO VISCOMI MRN: [id number]  Date of Birth: 1935/12/28

## 2018-10-14 ENCOUNTER — Ambulatory Visit (HOSPITAL_COMMUNITY): Payer: Medicare Other | Admitting: Speech Pathology

## 2018-10-16 ENCOUNTER — Ambulatory Visit (HOSPITAL_COMMUNITY): Payer: Medicare Other | Admitting: Speech Pathology

## 2018-10-16 ENCOUNTER — Encounter (HOSPITAL_COMMUNITY): Payer: Self-pay | Admitting: Speech Pathology

## 2018-10-16 DIAGNOSIS — R1312 Dysphagia, oropharyngeal phase: Secondary | ICD-10-CM | POA: Diagnosis not present

## 2018-10-16 NOTE — Therapy (Signed)
[place], [place], [address] Phone: [phone]   Fax:  [phone]  Speech Language Pathology Treatment  Patient Details  Name: Cameron Boyle MRN: [id number] Date of Birth: 05-Feb-1936 No data recorded  Encounter Date: 10/16/2018  End of Session - 10/16/18 1001    Visit Number  5    Number of Visits  9    Date for SLP Re-Evaluation  10/31/18    Authorization Type  Medicare [employer]    SLP Start Time  0945    SLP Stop Time   1020    SLP Time Calculation (min)  35 min    Activity Tolerance  Patient tolerated treatment well       Past Medical History:  Diagnosis Date  . Arthritis    left knee    Past Surgical History:  Procedure Laterality Date  . APPENDECTOMY    . left knee surgery    . LOOP RECORDER INSERTION N/A 07/22/2018   Procedure: LOOP RECORDER INSERTION;  Surgeon: Evans Lance, MD;  Location: [place] [facility];  Service: Cardiovascular;  Laterality: N/A;  . TEE WITHOUT CARDIOVERSION N/A 07/22/2018   Procedure: TRANSESOPHAGEAL ECHOCARDIOGRAM (TEE);  Surgeon: Dorothy Spark, MD;  Location: [facility] ENDOSCOPY;  Service: Cardiovascular;  Laterality: N/A;  . TONSILLECTOMY      There were no vitals filed for this visit.  Subjective Assessment - 10/16/18 0949    Subjective  "I had to get up this morning!"    Currently in Pain?  No/denies       ADULT SLP TREATMENT - 10/16/18 0001      General Information   Behavior/Cognition  Alert;Cooperative;Pleasant mood    Patient Positioning  Upright in chair    Oral care provided  N/A    HPI  Cameron Boyle is an 83 yo male who was referred by Dr. Leonie Man for MBSS due to PT noted Pt coughing excessively with thin liquids during PT evaluation. 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 Pt was transferred from [facility] ED to [facility] for further work up. Patient received acute care OT and PT services while admitted. He was  discharged home without therapy. MBSS completed last week with recommendation for dysphagia therapy.      Treatment Provided   Treatment provided  Dysphagia      Dysphagia Treatment   Temperature Spikes Noted  No    Respiratory Status  Room air    Oral Cavity - Dentition  Adequate natural dentition    Treatment Methods  Therapeutic exercise;Compensation strategy training;Patient/caregiver education    Patient observed directly with PO's  Yes    Type of PO's observed  Thin liquids    Feeding  Able to feed self    Liquids provided via  Cup    Pharyngeal Phase Signs & Symptoms  Immediate cough    Type of cueing  Verbal;Tactile;Visual    Amount of cueing  Minimal      Pain Assessment   Pain Assessment  No/denies pain      Assessment / Recommendations / Plan   Plan  Continue with current plan of care      Dysphagia Recommendations   Diet recommendations  Regular;Dysphagia 3 (mechanical soft);Thin liquid    Liquids provided via  Cup;No straw    Medication Administration  Whole meds with puree    Supervision  Patient able to self feed  Compensations  Small sips/bites;Multiple dry swallows after each bite/sip;Clear throat after each swallow;Hard cough after swallow    Postural Changes and/or Swallow Maneuvers  Seated upright 90 degrees;Upright 30-60 min after meal      Progression Toward Goals   Progression toward goals  Progressing toward goals         SLP Short Term Goals - 10/16/18 1002      SLP SHORT TERM GOAL #1   Title  Pt will complete pharyngeal swallowing exercises as assigned by SLP after demonstration and use of written cues 3x daily for 10 minutes each by Pt report    Baseline  Pt does not have exercises yet    Time  4    Period  Weeks    Status  On-going      SLP SHORT TERM GOAL #2   Title  Pt will implement compensatory swallowing strategies greater than 90% of the time during po trials in treatment with min cues (head turn RIGHT and dry swallows).     Baseline  not completing yet    Time  4    Period  Weeks    Status  On-going       SLP Long Term Goals - 10/16/18 1002      SLP LONG TERM GOAL #1   Title  Same as short       Plan - 10/16/18 1001    Clinical Impression Statement  Pt seen for dysphagia therapy. He reported daily execution of swallowing exercises and was able to demonstrate each exercise with only rare min SLP cues. He presented with improved vocal quality throughout the session today and he acknowledged a "stronger, clear voice". He consumed a banana sandwich at home and stated, "I notice a big difference. It went right on down."  He also reports success with decreased episodes of loss of saliva on the right side since he has increased frequencies of swallowing. He consumed sips of thin water via cup with immediate and delayed coughing. SLP encouraged him to take smaller sips and produce periodi throat clear or cough and repeat swallow immediately post swallow of thins. Pt has 9 exercises to complete and he was able to return demonstrate when SLP pointed to each exercise. Continue dysphagia treatment next session.       Speech Therapy Frequency  2x / week    Duration  4 weeks    Treatment/Interventions  Pharyngeal strengthening exercises;Trials of upgraded texture/liquids;Compensatory strategies;Patient/family education;Diet toleration management by SLP;SLP instruction and feedback;Compensatory techniques    Potential to Achieve Goals  Fair    Potential Considerations  Severity of impairments    SLP Home Exercise Plan  Pt will complete HEP as assigned to facilitate carryover of treatment strategies and techniques in home environment.    Consulted and Agree with Plan of Care  Patient       Patient will benefit from skilled therapeutic intervention in order to improve the following deficits and impairments:   Dysphagia, oropharyngeal phase    Problem List Patient Active Problem List   Diagnosis Date Noted  . Essential  hypertension 07/22/2018  . Hyperlipidemia 07/22/2018  . CKD (chronic kidney disease) 07/22/2018  . Anemia likely d/t chronic kidney disease 07/22/2018  . CVA (cerebral vascular accident) [facility]) - R MCA s/p tPA, embolic, unknown source 76/28/3151   Thank you,  [name] Churn, [place]  [facility] 10/16/2018, 10:23 AM  [place], [place], [address] Phone:  [phone]   Fax:  [phone]   Name: Cameron Boyle MRN: [id number] Date of Birth: [birth year]

## 2018-10-21 ENCOUNTER — Ambulatory Visit (HOSPITAL_COMMUNITY): Payer: Medicare Other | Admitting: Speech Pathology

## 2018-10-23 ENCOUNTER — Encounter (HOSPITAL_COMMUNITY): Payer: Self-pay | Admitting: Speech Pathology

## 2018-10-23 ENCOUNTER — Ambulatory Visit (HOSPITAL_COMMUNITY): Payer: Medicare Other | Attending: Neurology | Admitting: Speech Pathology

## 2018-10-23 DIAGNOSIS — R1312 Dysphagia, oropharyngeal phase: Secondary | ICD-10-CM | POA: Diagnosis present

## 2018-10-23 NOTE — Therapy (Signed)
[place], [place], [address] Phone: [phone]   Fax:  [phone]  Speech Language Pathology Treatment  Patient Details  Name: Cameron Boyle MRN: [id number] Date of Birth: [birth year] No data recorded  Encounter Date: 10/23/2018  End of Session - 10/23/18 1012    Visit Number  6    Number of Visits  9    Date for SLP Re-Evaluation  10/31/18    Authorization Type  Medicare [employer]    SLP Start Time  [phone]    SLP Stop Time   1020    SLP Time Calculation (min)  32 min    Activity Tolerance  Patient tolerated treatment well       Past Medical History:  Diagnosis Date  . Arthritis    left knee    Past Surgical History:  Procedure Laterality Date  . APPENDECTOMY    . left knee surgery    . LOOP RECORDER INSERTION N/A 07/22/2018   Procedure: LOOP RECORDER INSERTION;  Surgeon: Evans Lance, MD;  Location: [facility];  Service: Cardiovascular;  Laterality: N/A;  . TEE WITHOUT CARDIOVERSION N/A 07/22/2018   Procedure: TRANSESOPHAGEAL ECHOCARDIOGRAM (TEE);  Surgeon: Dorothy Spark, MD;  Location: [facility] ENDOSCOPY;  Service: Cardiovascular;  Laterality: N/A;  . TONSILLECTOMY      There were no vitals filed for this visit.  Subjective Assessment - 10/23/18 1002    Subjective  "I am doing real well."    Currently in Pain?  No/denies            ADULT SLP TREATMENT - 10/23/18 0001      General Information   Behavior/Cognition  Alert;Cooperative;Pleasant mood    Patient Positioning  Upright in chair    Oral care provided  N/A    HPI  Cameron Boyle is an 83 yo male who was referred by Dr. Leonie Man for MBSS due to PT noted Pt coughing excessively with thin liquids during PT evaluation. 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 Pt was transferred from [facility] ED to [facility] for further work up. Patient received acute care OT and PT services while admitted. He was  discharged home without therapy. MBSS completed last week with recommendation for dysphagia therapy.      Treatment Provided   Treatment provided  Dysphagia      Dysphagia Treatment   Temperature Spikes Noted  No    Respiratory Status  Room air    Oral Cavity - Dentition  Adequate natural dentition    Treatment Methods  Therapeutic exercise;Compensation strategy training;Patient/caregiver education    Patient observed directly with PO's  Yes    Type of PO's observed  Thin liquids    Feeding  Able to feed self    Liquids provided via  Cup    Pharyngeal Phase Signs & Symptoms  Delayed cough    Type of cueing  Verbal    Amount of cueing  Minimal      Pain Assessment   Pain Assessment  No/denies pain      Assessment / Recommendations / Plan   Plan  All goals met;Discharge SLP treatment due to (comment)      Dysphagia Recommendations   Diet recommendations  Regular;Dysphagia 3 (mechanical soft)    Liquids provided via  Cup;No straw    Medication Administration  Whole meds with puree    Supervision  Patient  able to self feed    Compensations  Small sips/bites;Multiple dry swallows after each bite/sip;Clear throat after each swallow;Hard cough after swallow    Postural Changes and/or Swallow Maneuvers  Seated upright 90 degrees;Upright 30-60 min after meal      Progression Toward Goals   Progression toward goals  Goals met, education completed, patient discharged from [facility] - 10/23/18 1012    Education Details  HEP reviewed with plan to continue going forward; monitor for s/sx of aspiration     Person(s) Educated  Patient    Methods  Explanation;Handout    Comprehension  Verbalized understanding       SLP Short Term Goals - 10/23/18 1025      SLP SHORT TERM GOAL #1   Title  Pt will complete pharyngeal swallowing exercises as assigned by SLP after demonstration and use of written cues 3x daily for 10 minutes each by Pt report    Baseline  Pt does not have  exercises yet    Time  4    Period  Weeks    Status  Achieved      SLP SHORT TERM GOAL #2   Title  Pt will implement compensatory swallowing strategies greater than 90% of the time during po trials in treatment with min cues (head turn RIGHT and dry swallows).    Baseline  not completing yet    Time  4    Period  Weeks    Status  Partially Met   Pt not completing head turn R      SLP Long Term Goals - 10/23/18 1025      SLP LONG TERM GOAL #1   Title  Same as short       Plan - 10/23/18 1013    Clinical Impression Statement  Pt seen for dysphagia therapy following MBSS completed last month. [name] reports good follow through of HEP and follows the written handout provided. He understands that he is being asked to complete these exercises on a daily basis for the rest of his life, as able. He reports an improvement in vocal quality and swallow ability, however it should be noted that he demonstrated reduced awareness to severity of dysphagia during MBSS. Pt with reduced wet vocal quality in sessions and improved cough. All recommendations and strategies were reviewed with Pt. He is not completing head turn with swallow despite recommendations for same. He was asked to notify his MD if he notes increased chest congestion, shortness of breath, and/or fever. Pt feels competent in completing his HEP and is discharged from SLP services at this time.     Treatment/Interventions  Pharyngeal strengthening exercises;Trials of upgraded texture/liquids;Compensatory strategies;Patient/family education;Diet toleration management by SLP;SLP instruction and feedback;Compensatory techniques    Potential to Achieve Goals  Fair    Potential Considerations  Severity of impairments    SLP Home Exercise Plan  Pt will complete HEP as assigned to facilitate carryover of treatment strategies and techniques in home environment.    Consulted and Agree with Plan of Care  Patient       Patient will benefit from  skilled therapeutic intervention in order to improve the following deficits and impairments:   Dysphagia, oropharyngeal phase    Problem List Patient Active Problem List   Diagnosis Date Noted  . Essential hypertension 07/22/2018  . Hyperlipidemia 07/22/2018  . CKD (chronic kidney disease) 07/22/2018  . Anemia likely d/t chronic kidney disease 07/22/2018  .  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  Patient goals were met. Patient is being discharged due to meeting the stated rehab goals.  ?????         Thank you,  [name] Churn, [place]  [facility] 10/23/2018, 10:26 AM  [place], [place], [address] Phone: [phone]   Fax:  [phone]   Name: Cameron Boyle MRN: [id number] Date of Birth: 11-Apr-1936

## 2018-10-29 ENCOUNTER — Ambulatory Visit (INDEPENDENT_AMBULATORY_CARE_PROVIDER_SITE_OTHER): Payer: Medicare Other

## 2018-10-29 DIAGNOSIS — I639 Cerebral infarction, unspecified: Secondary | ICD-10-CM

## 2018-10-29 LAB — CUP PACEART REMOTE DEVICE CHECK
MDC IDC PG IMPLANT DT: 20191104
MDC IDC SESS DTM: 20200211194023

## 2018-11-08 NOTE — Progress Notes (Signed)
Carelink Summary Report / Loop Recorder 

## 2018-11-21 ENCOUNTER — Ambulatory Visit: Payer: Medicare PPO | Admitting: Adult Health

## 2018-12-02 ENCOUNTER — Ambulatory Visit (INDEPENDENT_AMBULATORY_CARE_PROVIDER_SITE_OTHER): Payer: Medicare Other | Admitting: *Deleted

## 2018-12-02 ENCOUNTER — Other Ambulatory Visit: Payer: Self-pay

## 2018-12-02 DIAGNOSIS — I639 Cerebral infarction, unspecified: Secondary | ICD-10-CM

## 2018-12-03 LAB — CUP PACEART REMOTE DEVICE CHECK
Implantable Pulse Generator Implant Date: 20191104
MDC IDC SESS DTM: 20200315204101

## 2018-12-04 ENCOUNTER — Inpatient Hospital Stay (HOSPITAL_COMMUNITY)
Admission: EM | Admit: 2018-12-04 | Discharge: 2018-12-18 | DRG: 377 | Disposition: E | Payer: Medicare Other | Source: Ambulatory Visit | Attending: Internal Medicine | Admitting: Internal Medicine

## 2018-12-04 ENCOUNTER — Encounter (HOSPITAL_COMMUNITY): Payer: Self-pay | Admitting: Emergency Medicine

## 2018-12-04 ENCOUNTER — Other Ambulatory Visit: Payer: Self-pay

## 2018-12-04 ENCOUNTER — Emergency Department (HOSPITAL_COMMUNITY): Payer: Medicare Other

## 2018-12-04 DIAGNOSIS — Z87891 Personal history of nicotine dependence: Secondary | ICD-10-CM | POA: Diagnosis not present

## 2018-12-04 DIAGNOSIS — N184 Chronic kidney disease, stage 4 (severe): Secondary | ICD-10-CM | POA: Diagnosis present

## 2018-12-04 DIAGNOSIS — N179 Acute kidney failure, unspecified: Secondary | ICD-10-CM

## 2018-12-04 DIAGNOSIS — R5381 Other malaise: Secondary | ICD-10-CM | POA: Diagnosis present

## 2018-12-04 DIAGNOSIS — J690 Pneumonitis due to inhalation of food and vomit: Secondary | ICD-10-CM | POA: Diagnosis not present

## 2018-12-04 DIAGNOSIS — D649 Anemia, unspecified: Secondary | ICD-10-CM | POA: Diagnosis not present

## 2018-12-04 DIAGNOSIS — I69354 Hemiplegia and hemiparesis following cerebral infarction affecting left non-dominant side: Secondary | ICD-10-CM | POA: Diagnosis not present

## 2018-12-04 DIAGNOSIS — R0600 Dyspnea, unspecified: Secondary | ICD-10-CM

## 2018-12-04 DIAGNOSIS — Z931 Gastrostomy status: Secondary | ICD-10-CM

## 2018-12-04 DIAGNOSIS — D62 Acute posthemorrhagic anemia: Secondary | ICD-10-CM | POA: Diagnosis present

## 2018-12-04 DIAGNOSIS — R1310 Dysphagia, unspecified: Secondary | ICD-10-CM

## 2018-12-04 DIAGNOSIS — Z7902 Long term (current) use of antithrombotics/antiplatelets: Secondary | ICD-10-CM

## 2018-12-04 DIAGNOSIS — R1313 Dysphagia, pharyngeal phase: Secondary | ICD-10-CM | POA: Diagnosis present

## 2018-12-04 DIAGNOSIS — E785 Hyperlipidemia, unspecified: Secondary | ICD-10-CM | POA: Diagnosis present

## 2018-12-04 DIAGNOSIS — Z515 Encounter for palliative care: Secondary | ICD-10-CM | POA: Diagnosis present

## 2018-12-04 DIAGNOSIS — K922 Gastrointestinal hemorrhage, unspecified: Principal | ICD-10-CM | POA: Diagnosis present

## 2018-12-04 DIAGNOSIS — Z6820 Body mass index (BMI) 20.0-20.9, adult: Secondary | ICD-10-CM

## 2018-12-04 DIAGNOSIS — Z0189 Encounter for other specified special examinations: Secondary | ICD-10-CM

## 2018-12-04 DIAGNOSIS — K222 Esophageal obstruction: Secondary | ICD-10-CM | POA: Diagnosis present

## 2018-12-04 DIAGNOSIS — Z79899 Other long term (current) drug therapy: Secondary | ICD-10-CM | POA: Diagnosis not present

## 2018-12-04 DIAGNOSIS — R4702 Dysphasia: Secondary | ICD-10-CM | POA: Diagnosis present

## 2018-12-04 DIAGNOSIS — Z7189 Other specified counseling: Secondary | ICD-10-CM | POA: Diagnosis not present

## 2018-12-04 DIAGNOSIS — E860 Dehydration: Secondary | ICD-10-CM | POA: Diagnosis not present

## 2018-12-04 DIAGNOSIS — Z66 Do not resuscitate: Secondary | ICD-10-CM | POA: Diagnosis present

## 2018-12-04 DIAGNOSIS — R05 Cough: Secondary | ICD-10-CM

## 2018-12-04 DIAGNOSIS — I130 Hypertensive heart and chronic kidney disease with heart failure and stage 1 through stage 4 chronic kidney disease, or unspecified chronic kidney disease: Secondary | ICD-10-CM | POA: Diagnosis present

## 2018-12-04 DIAGNOSIS — I69391 Dysphagia following cerebral infarction: Secondary | ICD-10-CM | POA: Diagnosis not present

## 2018-12-04 DIAGNOSIS — R059 Cough, unspecified: Secondary | ICD-10-CM

## 2018-12-04 DIAGNOSIS — R000 Tachycardia, unspecified: Secondary | ICD-10-CM | POA: Diagnosis present

## 2018-12-04 DIAGNOSIS — M1712 Unilateral primary osteoarthritis, left knee: Secondary | ICD-10-CM | POA: Diagnosis present

## 2018-12-04 DIAGNOSIS — E872 Acidosis: Secondary | ICD-10-CM | POA: Diagnosis present

## 2018-12-04 DIAGNOSIS — M25511 Pain in right shoulder: Secondary | ICD-10-CM | POA: Diagnosis not present

## 2018-12-04 DIAGNOSIS — D631 Anemia in chronic kidney disease: Secondary | ICD-10-CM | POA: Diagnosis present

## 2018-12-04 DIAGNOSIS — F419 Anxiety disorder, unspecified: Secondary | ICD-10-CM | POA: Diagnosis present

## 2018-12-04 DIAGNOSIS — E43 Unspecified severe protein-calorie malnutrition: Secondary | ICD-10-CM | POA: Diagnosis present

## 2018-12-04 DIAGNOSIS — R52 Pain, unspecified: Secondary | ICD-10-CM

## 2018-12-04 DIAGNOSIS — J9601 Acute respiratory failure with hypoxia: Secondary | ICD-10-CM | POA: Diagnosis present

## 2018-12-04 DIAGNOSIS — N189 Chronic kidney disease, unspecified: Secondary | ICD-10-CM

## 2018-12-04 DIAGNOSIS — I5033 Acute on chronic diastolic (congestive) heart failure: Secondary | ICD-10-CM | POA: Diagnosis present

## 2018-12-04 DIAGNOSIS — R0902 Hypoxemia: Secondary | ICD-10-CM

## 2018-12-04 HISTORY — DX: Cerebral infarction, unspecified: I63.9

## 2018-12-04 LAB — COMPREHENSIVE METABOLIC PANEL
ALBUMIN: 3.4 g/dL — AB (ref 3.5–5.0)
ALT: 20 U/L (ref 0–44)
ANION GAP: 10 (ref 5–15)
AST: 24 U/L (ref 15–41)
Alkaline Phosphatase: 70 U/L (ref 38–126)
BILIRUBIN TOTAL: 0.4 mg/dL (ref 0.3–1.2)
BUN: 90 mg/dL — ABNORMAL HIGH (ref 8–23)
CHLORIDE: 112 mmol/L — AB (ref 98–111)
CO2: 17 mmol/L — ABNORMAL LOW (ref 22–32)
Calcium: 8.8 mg/dL — ABNORMAL LOW (ref 8.9–10.3)
Creatinine, Ser: 3.94 mg/dL — ABNORMAL HIGH (ref 0.61–1.24)
GFR calc Af Amer: 16 mL/min — ABNORMAL LOW (ref >60)
GFR calc non Af Amer: 13 mL/min — ABNORMAL LOW (ref >60)
GLUCOSE: 95 mg/dL (ref 70–99)
POTASSIUM: 4.9 mmol/L (ref 3.5–5.1)
SODIUM: 139 mmol/L (ref 135–145)
TOTAL PROTEIN: 7.5 g/dL (ref 6.5–8.1)

## 2018-12-04 LAB — FERRITIN: Ferritin: 242 ng/mL (ref 24–336)

## 2018-12-04 LAB — CBC WITH DIFFERENTIAL/PLATELET
ABS IMMATURE GRANULOCYTES: 0.05 10*3/uL (ref 0.00–0.07)
BASOS ABS: 0.1 10*3/uL (ref 0.0–0.1)
Basophils Relative: 1 %
Eosinophils Absolute: 0.5 10*3/uL (ref 0.0–0.5)
Eosinophils Relative: 6 %
HCT: 21.6 % — ABNORMAL LOW (ref 39.0–52.0)
HEMOGLOBIN: 6.7 g/dL — AB (ref 13.0–17.0)
IMMATURE GRANULOCYTES: 1 %
LYMPHS PCT: 11 %
Lymphs Abs: 0.9 10*3/uL (ref 0.7–4.0)
MCH: 29 pg (ref 26.0–34.0)
MCHC: 31 g/dL (ref 30.0–36.0)
MCV: 93.5 fL (ref 80.0–100.0)
MONOS PCT: 8 %
Monocytes Absolute: 0.7 10*3/uL (ref 0.1–1.0)
NEUTROS ABS: 6.1 10*3/uL (ref 1.7–7.7)
NEUTROS PCT: 73 %
NRBC: 0 % (ref 0.0–0.2)
Platelets: 190 10*3/uL (ref 150–400)
RBC: 2.31 MIL/uL — ABNORMAL LOW (ref 4.22–5.81)
RDW: 14.4 % (ref 11.5–15.5)
WBC: 8.3 10*3/uL (ref 4.0–10.5)

## 2018-12-04 LAB — RETICULOCYTES
IMMATURE RETIC FRACT: 15.2 % (ref 2.3–15.9)
RBC.: 2.31 MIL/uL — AB (ref 4.22–5.81)
RETIC COUNT ABSOLUTE: 57.1 10*3/uL (ref 19.0–186.0)
Retic Ct Pct: 2.5 % (ref 0.4–3.1)

## 2018-12-04 LAB — IRON AND TIBC
IRON: 12 ug/dL — AB (ref 45–182)
Saturation Ratios: 5 % — ABNORMAL LOW (ref 17.9–39.5)
TIBC: 266 ug/dL (ref 250–450)
UIBC: 254 ug/dL

## 2018-12-04 LAB — FOLATE: FOLATE: 19.4 ng/mL (ref >5.9)

## 2018-12-04 LAB — VITAMIN B12: Vitamin B-12: 500 pg/mL (ref 180–914)

## 2018-12-04 LAB — BRAIN NATRIURETIC PEPTIDE: B Natriuretic Peptide: 494 pg/mL — ABNORMAL HIGH (ref 0.0–100.0)

## 2018-12-04 LAB — PREPARE RBC (CROSSMATCH)

## 2018-12-04 LAB — ABO/RH: ABO/RH(D): B POS

## 2018-12-04 MED ORDER — AMLODIPINE BESYLATE 5 MG PO TABS
10.0000 mg | ORAL_TABLET | Freq: Every day | ORAL | Status: DC
  Administered 2018-12-04 – 2018-12-14 (×10): 10 mg via ORAL
  Filled 2018-12-04 (×10): qty 2

## 2018-12-04 MED ORDER — SODIUM CHLORIDE 0.9% FLUSH: 3.0000 mL | INTRAVENOUS | Status: DC | PRN

## 2018-12-04 MED ORDER — ACETAMINOPHEN 325 MG PO TABS
650.0000 mg | ORAL_TABLET | ORAL | Status: DC | PRN
  Administered 2018-12-06 – 2018-12-12 (×3): 650 mg via ORAL
  Filled 2018-12-04 (×3): qty 2

## 2018-12-04 MED ORDER — SODIUM CHLORIDE 0.9 % IV SOLN
510.0000 mg | Freq: Once | INTRAVENOUS | Status: AC
  Administered 2018-12-04: 510 mg via INTRAVENOUS
  Filled 2018-12-04: qty 17

## 2018-12-04 MED ORDER — ONDANSETRON HCL 4 MG/2ML IJ SOLN: 4.0000 mg | Freq: Four times a day (QID) | INTRAMUSCULAR | Status: DC | PRN

## 2018-12-04 MED ORDER — HYDRALAZINE HCL 25 MG PO TABS
25.0000 mg | ORAL_TABLET | Freq: Three times a day (TID) | ORAL | Status: DC
  Administered 2018-12-04 – 2018-12-15 (×32): 25 mg via ORAL
  Filled 2018-12-04 (×32): qty 1

## 2018-12-04 MED ORDER — SODIUM CHLORIDE 0.9 % IV SOLN: 250.0000 mL | INTRAVENOUS | Status: DC | PRN

## 2018-12-04 MED ORDER — SODIUM CHLORIDE 0.9 % IV SOLN
10.0000 mL/h | Freq: Once | INTRAVENOUS | Status: AC
  Administered 2018-12-04: 10 mL/h via INTRAVENOUS

## 2018-12-04 MED ORDER — HYDRALAZINE HCL 20 MG/ML IJ SOLN: 10.0000 mg | Freq: Four times a day (QID) | INTRAMUSCULAR | Status: DC | PRN

## 2018-12-04 MED ORDER — FUROSEMIDE 10 MG/ML IJ SOLN
40.0000 mg | Freq: Every day | INTRAMUSCULAR | Status: DC
  Administered 2018-12-04 – 2018-12-06 (×3): 40 mg via INTRAVENOUS
  Filled 2018-12-04 (×3): qty 4

## 2018-12-04 MED ORDER — SODIUM CHLORIDE 0.9 % IV SOLN
INTRAVENOUS | Status: DC | PRN
  Administered 2018-12-04 – 2018-12-11 (×2): 250 mL via INTRAVENOUS

## 2018-12-04 MED ORDER — ATORVASTATIN CALCIUM 20 MG PO TABS
20.0000 mg | ORAL_TABLET | Freq: Every day | ORAL | Status: DC
  Administered 2018-12-04 – 2018-12-14 (×9): 20 mg via ORAL
  Filled 2018-12-04 (×10): qty 1

## 2018-12-04 MED ORDER — SODIUM CHLORIDE 0.9% FLUSH
3.0000 mL | Freq: Two times a day (BID) | INTRAVENOUS | Status: DC
  Administered 2018-12-04 – 2018-12-15 (×18): 3 mL via INTRAVENOUS

## 2018-12-04 NOTE — H&P (Signed)
History and Physical  Cameron Boyle CBJ:[id number] DOB: August 11, 1936 DOA: 11/18/2018   PCP: [name], MD (Inactive)   Patient coming from: Home  Chief Complaint: Dyspnea on exertion and low hemoglobin  HPI:  Cameron Boyle is a 83 y.o. male with medical history of diastolic dysfunction, right MCA stroke, hypertension, hyperlipidemia, CKD stage IV presenting with 1 to 21-month history of dyspnea on exertion.  Patient had routine blood work obtained by his PCP on 11/28/2018.  His hemoglobin was 6.3.  However further history reveals that the patient has had increasing generalized weakness, fatigue, and dyspnea on exertion for nearly 2 months.  He also complains of increasing lower extremity edema.  He denies any frank PND orthopnea.  He denies any chest pain, fevers, chills, coughing, hemoptysis.  There is been no hematochezia, melena, hematemesis, hematuria. The patient was admitted to the hospital from 07/20/2019 through 07/23/2019 for right MCA stroke.  The patient was discharged home at that time with aspirin and Plavix for 3 weeks, then on Plavix monotherapy thereafter.  He endorses compliance with therapy. In the emergency department, the patient was afebrile hemodynamically stable saturating 95-97% on room air.  BMP showed a serum creatinine 3.94.  Hemoglobin 6.7, WBC 8.3, platelets 190,000.  Chest x-ray showed patchy bilateral interstitial and alveolar opacities.  EKG showed sinus rhythm with early repolarization.  1 unit PRBC was ordered.  Because of the patient's worsening anemia and dyspnea on exertion, the patient was admitted for further evaluation.  Assessment/Plan: Acute on chronic diastolic CHF -The patient appears clinically fluid overloaded with peripheral edema, JVD, and increased pulmonary interstitial markings on chest x-ray -Start furosemide 40 mg IV daily -Daily weights -Accurate I's and O's -07/22/2018 echo EF 60-65%, no WMA, grade 1 DD  Acute on chronic renal failure--CKD  stage IV -Baseline creatinine 2.9-3.1 -Presenting creatinine 1.60 -Certainly, the patient may just have progression of underlying CKD  Chronic blood loss anemia -Hemoglobin 10.4 on 07/22/2018 -FOBT in the emergency department was negative -GI consult -Iron saturation 5%, ferritin 242 -Transfuse Feraheme x1 -Serum V37 106 -Folic acid 26.9 -Transfuse 1 unit PRBC  Essential hypertension -Continue amlodipine and hydralazine  Hyperlipidemia -Continue statin  History of right MCA stroke -Patient had loop recorder placed 07/22/18 -Holding Plavix temporarily        Past Medical History:  Diagnosis Date  . Arthritis    left knee  . Stroke [facility])    07/18/18   Past Surgical History:  Procedure Laterality Date  . APPENDECTOMY    . left knee surgery    . LOOP RECORDER INSERTION N/A 07/22/2018   Procedure: LOOP RECORDER INSERTION;  Surgeon: Evans Lance, MD;  Location: [facility];  Service: Cardiovascular;  Laterality: N/A;  . TEE WITHOUT CARDIOVERSION N/A 07/22/2018   Procedure: TRANSESOPHAGEAL ECHOCARDIOGRAM (TEE);  Surgeon: Dorothy Spark, MD;  Location: [facility] ENDOSCOPY;  Service: Cardiovascular;  Laterality: N/A;  . TONSILLECTOMY     Social History:  reports that he has quit smoking. He has never used smokeless tobacco. He reports that he does not drink alcohol or use drugs.   History reviewed. No pertinent family history.   No Known Allergies   Prior to Admission medications   Medication Sig Start Date End Date Taking? Authorizing Provider  amLODipine (NORVASC) 10 MG tablet Take 1 tablet (10 mg total) by mouth daily. 07/23/18  Yes Donzetta Starch, NP  atorvastatin (LIPITOR) 20 MG tablet Take 1 tablet (20 mg total)  by mouth daily at 6 PM. 07/22/18  Yes Donzetta Starch, NP  hydrALAZINE (APRESOLINE) 25 MG tablet Take 1 tablet (25 mg total) by mouth every 8 (eight) hours. Patient taking differently: Take 50 mg by mouth every 8 (eight) hours.  07/22/18  Yes Donzetta Starch, NP  Multiple Vitamins-Minerals (MENS MULTIPLUS PO) Take 1 tablet by mouth daily.     Yes [provider]  clopidogrel (PLAVIX) 75 MG tablet Take 1 tablet (75 mg total) by mouth daily. Patient not taking: Reported on 11/20/2018 07/23/18   Donzetta Starch, NP  HYDROcodone-acetaminophen [facility]) 5-325 MG per tablet 1/2 to 1 tablet by mouth every 4 hours prn pain Patient not taking: Reported on 11/21/2018 07/02/11   Evalee Jefferson, PA-C    Review of Systems:  Constitutional:  No weight loss, night sweats, Fevers, chills, fatigue.  Head&Eyes: No headache.  No vision loss.  No eye pain or scotoma ENT:  No Difficulty swallowing,Tooth/dental problems,Sore throat,  No ear ache, post nasal drip,  Cardio-vascular:  No chest pain, Orthopnea, PND,  dizziness, palpitations  GI:  No  abdominal pain, nausea, vomiting, diarrhea, loss of appetite, hematochezia, melena, heartburn, indigestion, Resp:  No cough. No coughing up of blood .No wheezing.No chest wall deformity  Skin:  no rash or lesions.  GU:  no dysuria, change in color of urine, no urgency or frequency. No flank pain.  Musculoskeletal:  No joint pain or swelling. No decreased range of motion. No back pain.  Psych:  No change in mood or affect. No depression or anxiety. Neurologic: No headache, no dysesthesia, no focal weakness, no vision loss. No syncope  Physical Exam: Vitals:   11/27/2018 1230 11/27/2018 1300 12/14/2018 1330 12/07/2018 1342  BP: (!) 171/69 (!) 167/68 (!) 171/73   Pulse: 70   86  Resp: 15 12 16 15   Temp:    97.8 F (36.6 C)  TempSrc:    Oral  SpO2: 95%   95%  Weight:      Height:       General:  A&O x 3, NAD, nontoxic, pleasant/cooperative Head/Eye: No conjunctival hemorrhage, no icterus, [place]/AT, No nystagmus ENT:  No icterus,  No thrush, good dentition, no pharyngeal exudate Neck:  No masses, no lymphadenpathy, no bruits CV:  RRR, no rub, no gallop, no S3 Lung:  Bilateral crackles, no wheeze Abdomen:  soft/NT, +BS, nondistended, no peritoneal signs Ext: No cyanosis, No rashes, No petechiae, No lymphangitis, 2 +LE edema Neuro: CNII-XII intact, strength 4/5 in bilateral upper and lower extremities, no dysmetria  Labs on Admission:  Basic Metabolic Panel: Recent Labs  Lab 12/06/2018 1110  NA 139  K 4.9  CL 112*  CO2 17*  GLUCOSE 95  BUN 90*  CREATININE 3.94*  CALCIUM 8.8*   Liver Function Tests: Recent Labs  Lab 11/29/2018 1110  AST 24  ALT 20  ALKPHOS 70  BILITOT 0.4  PROT 7.5  ALBUMIN 3.4*   No results for input(s): LIPASE, AMYLASE in the last 168 hours. No results for input(s): AMMONIA in the last 168 hours. CBC: Recent Labs  Lab 11/18/2018 1110  WBC 8.3  NEUTROABS 6.1  HGB 6.7*  HCT 21.6*  MCV 93.5  PLT 190   Coagulation Profile: No results for input(s): INR, PROTIME in the last 168 hours. Cardiac Enzymes: No results for input(s): CKTOTAL, CKMB, CKMBINDEX, TROPONINI in the last 168 hours. BNP: Invalid input(s): POCBNP CBG: No results for input(s): GLUCAP in the last 168 hours. Urine analysis:  Component Value Date/Time   COLORURINE YELLOW 07/19/2018 1054   APPEARANCEUR CLEAR 07/19/2018 1054   LABSPEC 1.008 07/19/2018 1054   PHURINE 7.0 07/19/2018 1054   GLUCOSEU 50 (A) 07/19/2018 1054   HGBUR NEGATIVE 07/19/2018 1054   BILIRUBINUR NEGATIVE 07/19/2018 1054   KETONESUR NEGATIVE 07/19/2018 1054   PROTEINUR 100 (A) 07/19/2018 1054   NITRITE NEGATIVE 07/19/2018 1054   LEUKOCYTESUR NEGATIVE 07/19/2018 1054   Sepsis Labs: @LABRCNTIP (procalcitonin:4,lacticidven:4) )No results found for this or any previous visit (from the past 240 hour(s)).   Radiological Exams on Admission: Dg Chest 2 View  Result Date: 12/07/2018 CLINICAL DATA:  Cough EXAM: CHEST - 2 VIEW COMPARISON:  09/02/2013 FINDINGS: There are patchy areas of interstitial and alveolar airspace disease in the upper and lower lobes bilaterally concerning for interstitial pneumonitis secondary to an  infectious or inflammatory etiology versus mild pulmonary edema. There is no pleural effusion or pneumothorax. The heart and mediastinal contours are unremarkable. The osseous structures are unremarkable. IMPRESSION: Patchy areas of interstitial and alveolar airspace disease in the upper and lower lobes bilaterally concerning for interstitial pneumonitis secondary to an infectious or inflammatory etiology versus mild pulmonary edema. Electronically Signed   By: Kathreen Devoid   On: 11/25/2018 11:46    EKG: Independently reviewed. Sinus, early repolarization    Time spent:60 minutes Code Status:   FULL Family Communication:  No Family at bedside Disposition Plan: expect 1-2 day hospitalization Consults called: none DVT Prophylaxis:  SCDs  Orson Eva, DO  [facility] Pager [phone]  If 7PM-7AM, please contact night-coverage [email or web address] Password [email or web address] 12/03/2018, 1:55 PM

## 2018-12-04 NOTE — ED Notes (Addendum)
Date and time results received: 12/12/2018 1131 (use smartphrase ".now" to insert current time)  Test: hemeglovin Critical Value: 6.7  Name of Provider Notified: Dr. Alvino Chapel  Orders Received? Or Actions Taken?: none at this time

## 2018-12-04 NOTE — ED Notes (Signed)
Date and time results received: 12/11/2018 11:30 AM  Test: hgb Critical Value: 6.4  Name of Provider Notified: Dr Alvino Chapel   Orders Received? Or Actions Taken?:

## 2018-12-04 NOTE — ED Notes (Signed)
Pt urinated. Did bladder scan to see post residual. Post residual was 57ml.

## 2018-12-04 NOTE — ED Provider Notes (Signed)
[facility] EMERGENCY DEPARTMENT Provider Note   CSN: [id number] Arrival date & time: 11/25/2018  1039    History   Chief Complaint Chief Complaint  Patient presents with  . Abnormal Lab    HPI Cameron Boyle is a 83 y.o. male.     HPI Patient sent in from PCP for anemia.  Hemoglobin is 6.3.  It drawn 6 days ago.  Reportedly has had weakness and fatigue.  No blood in the stools or black stools.  States he has more shortness of breath when he tries to walk.  He is on Plavix for a stroke back around 6 months ago.  States he has been fatigued since he is started on the medicines for the stroke.  No blood in the urine.  States he has been urinating more frequently.  Creatinine is also up to nearly 4.  Baseline appears to be around 3.  Patient's primary care doctor sent him in for likely transfusion. Past Medical History:  Diagnosis Date  . Arthritis    left knee  . Stroke [facility])    07/18/18    Patient Active Problem List   Diagnosis Date Noted  . Essential hypertension 07/22/2018  . Hyperlipidemia 07/22/2018  . CKD (chronic kidney disease) 07/22/2018  . Anemia likely d/t chronic kidney disease 07/22/2018  . CVA (cerebral vascular accident) ([place]) - R MCA s/p tPA, embolic, unknown source 26/94/8546    Past Surgical History:  Procedure Laterality Date  . APPENDECTOMY    . left knee surgery    . LOOP RECORDER INSERTION N/A 07/22/2018   Procedure: LOOP RECORDER INSERTION;  Surgeon: Evans Lance, MD;  Location: [place] CV LAB;  Service: Cardiovascular;  Laterality: N/A;  . TEE WITHOUT CARDIOVERSION N/A 07/22/2018   Procedure: TRANSESOPHAGEAL ECHOCARDIOGRAM (TEE);  Surgeon: Dorothy Spark, MD;  Location: [facility] ENDOSCOPY;  Service: Cardiovascular;  Laterality: N/A;  . TONSILLECTOMY          Home Medications    Prior to Admission medications   Medication Sig Start Date End Date Taking? Authorizing Provider  amLODipine (NORVASC) 10 MG tablet Take 1 tablet (10 mg total) by  mouth daily. 07/23/18  Yes Donzetta Starch, NP  atorvastatin (LIPITOR) 20 MG tablet Take 1 tablet (20 mg total) by mouth daily at 6 PM. 07/22/18  Yes Donzetta Starch, NP  hydrALAZINE (APRESOLINE) 25 MG tablet Take 1 tablet (25 mg total) by mouth every 8 (eight) hours. Patient taking differently: Take 50 mg by mouth every 8 (eight) hours.  07/22/18  Yes Donzetta Starch, NP  Multiple Vitamins-Minerals (MENS MULTIPLUS PO) Take 1 tablet by mouth daily.     Yes [provider]  clopidogrel (PLAVIX) 75 MG tablet Take 1 tablet (75 mg total) by mouth daily. Patient not taking: Reported on 12/01/2018 07/23/18   Donzetta Starch, NP  HYDROcodone-acetaminophen [facility]) 5-325 MG per tablet 1/2 to 1 tablet by mouth every 4 hours prn pain Patient not taking: Reported on 12/12/2018 07/02/11   Evalee Jefferson, PA-C    Family History History reviewed. No pertinent family history.  Social History Social History   Tobacco Use  . Smoking status: Former Research scientist (life sciences)  . Smokeless tobacco: Never Used  Substance Use Topics  . Alcohol use: No  . Drug use: No     Allergies   Patient has no known allergies.   Review of Systems Review of Systems  Constitutional: Positive for fatigue. Negative for appetite change.  HENT: Negative for congestion.  Respiratory: Positive for cough and shortness of breath.   Cardiovascular: Negative for chest pain.  Gastrointestinal: Negative for abdominal pain, anal bleeding and nausea.  Genitourinary: Positive for frequency.  Musculoskeletal: Negative for back pain.  Skin: Negative for rash.  Neurological: Positive for weakness.  Psychiatric/Behavioral: Negative for confusion.     Physical Exam Updated Vital Signs BP (!) 176/79   Pulse 84   Temp 97.8 F (36.6 C) (Oral)   Resp 18   Ht 5\' 10"  (1.778 m)   Wt 62.1 kg   SpO2 92%   BMI 19.66 kg/m   Physical Exam Vitals signs and nursing note reviewed.  HENT:     Head: Atraumatic.     Mouth/Throat:     Mouth: Mucous  membranes are moist.  Neck:     Musculoskeletal: Neck supple.  Cardiovascular:     Rate and Rhythm: Regular rhythm.  Pulmonary:     Comments: Mildly harsh breath sounds with coughing. Abdominal:     Tenderness: There is no abdominal tenderness.  Musculoskeletal:     Right lower leg: No edema.     Left lower leg: No edema.  Skin:    General: Skin is warm.     Capillary Refill: Capillary refill takes less than 2 seconds.  Neurological:     Mental Status: He is alert and oriented to person, place, and time. Mental status is at baseline.  Psychiatric:        Mood and Affect: Mood normal.      ED Treatments / Results  Labs (all labs ordered are listed, but only abnormal results are displayed) Labs Reviewed  CBC WITH DIFFERENTIAL/PLATELET - Abnormal; Notable for the following components:      Result Value   RBC 2.31 (*)    Hemoglobin 6.7 (*)    HCT 21.6 (*)    All other components within normal limits  RETICULOCYTES - Abnormal; Notable for the following components:   RBC. 2.31 (*)    All other components within normal limits  COMPREHENSIVE METABOLIC PANEL - Abnormal; Notable for the following components:   Chloride 112 (*)    CO2 17 (*)    BUN 90 (*)    Creatinine, Ser 3.94 (*)    Calcium 8.8 (*)    Albumin 3.4 (*)    GFR calc non Af Amer 13 (*)    GFR calc Af Amer 16 (*)    All other components within normal limits  POC OCCULT BLOOD, ED    EKG None  Radiology Dg Chest 2 View  Result Date: 11/22/2018 CLINICAL DATA:  Cough EXAM: CHEST - 2 VIEW COMPARISON:  09/02/2013 FINDINGS: There are patchy areas of interstitial and alveolar airspace disease in the upper and lower lobes bilaterally concerning for interstitial pneumonitis secondary to an infectious or inflammatory etiology versus mild pulmonary edema. There is no pleural effusion or pneumothorax. The heart and mediastinal contours are unremarkable. The osseous structures are unremarkable. IMPRESSION: Patchy areas of  interstitial and alveolar airspace disease in the upper and lower lobes bilaterally concerning for interstitial pneumonitis secondary to an infectious or inflammatory etiology versus mild pulmonary edema. Electronically Signed   By: Kathreen Devoid   On: 12/11/2018 11:46    Procedures Procedures (including critical care time)  Medications Ordered in ED Medications - No data to display   Initial Impression / Assessment and Plan / ED Course  I have reviewed the triage vital signs and the nursing notes.  Pertinent labs & imaging results  that were available during my care of the patient were reviewed by me and considered in my medical decision making (see chart for details).        Patient presents with anemia.  Had hemoglobin of 6.3 as an outpatient.  6.7 on our recheck here.  He is however symptomatic.  More shortness of breath and fatigue.  Unable to do the usual ADLs.  Has had a cough and x-ray shows some potential pneumonitis versus pulmonary edema.  Patient was guaiac negative with brown stool.  Reticulocyte count is normal which I would expect to be high due to the anemia.  He has worsening renal function with creatinine going from 3 up to 4.  Cells are normocytic.  Will admit to hospitalist.  CRITICAL CARE Performed by: Davonna Belling Total critical care time: 30 minutes Critical care time was exclusive of separately billable procedures and treating other patients. Critical care was necessary to treat or prevent imminent or life-threatening deterioration. Critical care was time spent personally by me on the following activities: development of treatment plan with patient and/or surrogate as well as nursing, discussions with consultants, evaluation of patient's response to treatment, examination of patient, obtaining history from patient or surrogate, ordering and performing treatments and interventions, ordering and review of laboratory studies, ordering and review of radiographic  studies, pulse oximetry and re-evaluation of patient's condition.   Final Clinical Impressions(s) / ED Diagnoses   Final diagnoses:  Symptomatic anemia    ED Discharge Orders    None       Davonna Belling, MD 12/09/2018 1209

## 2018-12-04 NOTE — ED Triage Notes (Signed)
Pt comes in d/t low hgb (6.3), that was drawn on 3/12. Increased weakness, fatigue, and chilled. Pt denies bloody stools. Pt was on a blood thinner up until today when he was advised to come to ED d/t abnormal labs.

## 2018-12-05 ENCOUNTER — Encounter (HOSPITAL_COMMUNITY): Payer: Self-pay | Admitting: Gastroenterology

## 2018-12-05 ENCOUNTER — Inpatient Hospital Stay (HOSPITAL_COMMUNITY): Payer: Medicare Other

## 2018-12-05 DIAGNOSIS — I5033 Acute on chronic diastolic (congestive) heart failure: Secondary | ICD-10-CM

## 2018-12-05 LAB — BPAM RBC
Blood Product Expiration Date: 202004042359
ISSUE DATE / TIME: 202003181338
Unit Type and Rh: 1700

## 2018-12-05 LAB — BASIC METABOLIC PANEL
Anion gap: 10 (ref 5–15)
BUN: 89 mg/dL — ABNORMAL HIGH (ref 8–23)
CHLORIDE: 114 mmol/L — AB (ref 98–111)
CO2: 17 mmol/L — ABNORMAL LOW (ref 22–32)
Calcium: 8.7 mg/dL — ABNORMAL LOW (ref 8.9–10.3)
Creatinine, Ser: 4.07 mg/dL — ABNORMAL HIGH (ref 0.61–1.24)
GFR calc Af Amer: 15 mL/min — ABNORMAL LOW (ref >60)
GFR calc non Af Amer: 13 mL/min — ABNORMAL LOW (ref >60)
Glucose, Bld: 89 mg/dL (ref 70–99)
Potassium: 4.7 mmol/L (ref 3.5–5.1)
Sodium: 141 mmol/L (ref 135–145)

## 2018-12-05 LAB — TYPE AND SCREEN
ABO/RH(D): B POS
Antibody Screen: NEGATIVE
UNIT DIVISION: 0

## 2018-12-05 LAB — CBC
HCT: 24.7 % — ABNORMAL LOW (ref 39.0–52.0)
Hemoglobin: 7.9 g/dL — ABNORMAL LOW (ref 13.0–17.0)
MCH: 28.9 pg (ref 26.0–34.0)
MCHC: 32 g/dL (ref 30.0–36.0)
MCV: 90.5 fL (ref 80.0–100.0)
Platelets: 174 10*3/uL (ref 150–400)
RBC: 2.73 MIL/uL — ABNORMAL LOW (ref 4.22–5.81)
RDW: 14.7 % (ref 11.5–15.5)
WBC: 11.5 10*3/uL — ABNORMAL HIGH (ref 4.0–10.5)
nRBC: 0 % (ref 0.0–0.2)

## 2018-12-05 LAB — ECHOCARDIOGRAM COMPLETE
Height: 70 in
WEIGHTICAEL: 2165.8 [oz_av]

## 2018-12-05 MED ORDER — GUAIFENESIN-DM 100-10 MG/5ML PO SYRP
15.0000 mL | ORAL_SOLUTION | ORAL | Status: DC | PRN
  Administered 2018-12-07 – 2018-12-11 (×2): 15 mL via ORAL
  Filled 2018-12-05 (×3): qty 15

## 2018-12-05 MED ORDER — PEG 3350-KCL-NA BICARB-NACL 420 G PO SOLR
4000.0000 mL | Freq: Once | ORAL | Status: AC
  Administered 2018-12-05: 4000 mL via ORAL

## 2018-12-05 MED ORDER — BISACODYL 5 MG PO TBEC
15.0000 mg | DELAYED_RELEASE_TABLET | Freq: Once | ORAL | Status: AC
  Administered 2018-12-05: 15 mg via ORAL
  Filled 2018-12-05: qty 3

## 2018-12-05 MED ORDER — CLOPIDOGREL BISULFATE 75 MG PO TABS
75.0000 mg | ORAL_TABLET | Freq: Every day | ORAL | Status: DC
  Administered 2018-12-05 – 2018-12-09 (×4): 75 mg via ORAL
  Filled 2018-12-05 (×4): qty 1

## 2018-12-05 MED ORDER — PANTOPRAZOLE SODIUM 40 MG PO TBEC
40.0000 mg | DELAYED_RELEASE_TABLET | Freq: Two times a day (BID) | ORAL | Status: DC
  Administered 2018-12-05 – 2018-12-12 (×11): 40 mg via ORAL
  Filled 2018-12-05 (×12): qty 1

## 2018-12-05 NOTE — Progress Notes (Signed)
Patient Demographics:    Cameron Boyle, is a 83 y.o. male, DOB - Apr 06, 1936, YQI:[id number]  Admit date - 12/01/2018   Admitting Physician Orson Eva, MD  Outpatient Primary MD for the patient is [name], MD (Inactive)  LOS - 1   Chief Complaint  Patient presents with  . Abnormal Lab        Subjective:    Cameron Boyle today has no fevers, no emesis,  No chest pain, no further concerns about GI blood loss  Assessment  & Plan :    Active Problems:   Acute blood loss anemia   Acute on chronic diastolic CHF (congestive heart failure) (HCC)   Acute renal failure superimposed on stage 4 chronic kidney disease (HCC)   Transfusion-dependent anemia  Brief Summary:- 83 y.o. male with medical history of diastolic dysfunction, right MCA stroke, hypertension, hyperlipidemia, CKD stage IV presenting with 1 to 40-month history of dyspnea on exertion.   Admitted on 11/28/2018 with tachycardia, volume overload symptoms as well as hemoglobin dropping down to 6.7   Plan:-  1)Acute on chronic chronic blood loss anemia -Hemoglobin 10.4 on 07/22/2018 --GI consult -Iron saturation 5%, ferritin 242 -Transfuse Feraheme x1 -Serum L87 564 -Folic acid 33.2 -Hemoglobin up to 7.9 from 6.7 after transfusion of 4 units of packed cells   2)HFpEF/Acute on chronic diastolic CHF ---Improving on IV Lasix-- c/n  furosemide 40 mg IV daily -Daily weights -Accurate I's and O's -07/22/2018 echo EF 60-65%, no WMA, grade 1 DD  3)Acute on chronic renal failure--CKD stage IV -Baseline creatinine 2.9-3.1 -Presenting creatinine 3.94, creatinine now up to 4.0 -AKI on CKD stage IV  Vs progression of underlying CKD   4)Essential Hypertension Stable -Continue amlodipine and hydralazine  5)Hyperlipidemia -Continue statin  6)History of right MCA stroke -Patient had loop recorder placed 07/22/18 -Holding Plavix temporarily given  concerns about GI bleed awaiting EGD and colonoscopy on 12/01/2018  Disposition/Need for in-Hospital Stay- patient unable to be discharged at this time due to patient with CHF flareup/volume overload requiring IV Lasix, also has presumed GI Bleed with drop in H&H, patient had tachycardia requiring transfusion, awaiting EGD and colonoscopy on 11/18/2018  Code Status : full   Family Communication:   na   Disposition Plan  : home with HH  Consults  :  Gi  DVT Prophylaxis  :  SCDs   Lab Results  Component Value Date   PLT 174 12/05/2018    Inpatient Medications  Scheduled Meds: . amLODipine  10 mg Oral Daily  . atorvastatin  20 mg Oral q1800  . clopidogrel  75 mg Oral Daily  . furosemide  40 mg Intravenous Daily  . hydrALAZINE  25 mg Oral Q8H  . pantoprazole  40 mg Oral BID AC  . sodium chloride flush  3 mL Intravenous Q12H   Continuous Infusions: . sodium chloride    . sodium chloride Stopped (11/24/2018 2229)   PRN Meds:.sodium chloride, sodium chloride, acetaminophen, hydrALAZINE, ondansetron (ZOFRAN) IV, sodium chloride flush    Anti-infectives (From admission, onward)   None        Objective:   Vitals:   12/01/2018 2110 12/05/18 0500 12/05/18 0547 12/05/18 1400  BP: (!) 158/75  (!) 150/70 (!) 148/69  Pulse: 82  (!)  101 78  Resp: 20  16 19   Temp: 98 F (36.7 C)  99.3 F (37.4 C) 98.8 F (37.1 C)  TempSrc: Oral  Oral Oral  SpO2: 95%  93% 97%  Weight:  61.4 kg    Height:        Wt Readings from Last 3 Encounters:  12/05/18 61.4 kg  08/20/18 63.5 kg  07/22/18 55.8 kg     Intake/Output Summary (Last 24 hours) at 12/05/2018 1717 Last data filed at 12/05/2018 1300 Gross per 24 hour  Intake 642.54 ml  Output 1850 ml  Net -1207.46 ml     Physical Exam Patient is examined daily including today on 12/05/2018, exams remain the same as of yesterday except that has changed   Gen:- Awake Alert,  In no apparent distress  HEENT:- [place].AT, No sclera icterus  Neck-Supple Neck,No JVD,.  Lungs-diminished breath sounds in bases, bibasilar rales, no wheezing  CV- S1, S2 normal, regular  Abd-  +ve B.Sounds, Abd Soft, No tenderness,    Extremity/Skin:- + edema, pedal pulses present  Psych-affect is appropriate, oriented x3 Neuro-no new focal deficits, no tremors   Data Review:   Micro Results No results found for this or any previous visit (from the past 240 hour(s)).  Radiology Reports Dg Chest 2 View  Result Date: 11/22/2018 CLINICAL DATA:  Cough EXAM: CHEST - 2 VIEW COMPARISON:  09/02/2013 FINDINGS: There are patchy areas of interstitial and alveolar airspace disease in the upper and lower lobes bilaterally concerning for interstitial pneumonitis secondary to an infectious or inflammatory etiology versus mild pulmonary edema. There is no pleural effusion or pneumothorax. The heart and mediastinal contours are unremarkable. The osseous structures are unremarkable. IMPRESSION: Patchy areas of interstitial and alveolar airspace disease in the upper and lower lobes bilaterally concerning for interstitial pneumonitis secondary to an infectious or inflammatory etiology versus mild pulmonary edema. Electronically Signed   By: Kathreen Devoid   On: 11/18/2018 11:46     CBC Recent Labs  Lab 12/16/2018 1110 12/05/18 0455  WBC 8.3 11.5*  HGB 6.7* 7.9*  HCT 21.6* 24.7*  PLT 190 174  MCV 93.5 90.5  MCH 29.0 28.9  MCHC 31.0 32.0  RDW 14.4 14.7  LYMPHSABS 0.9  --   MONOABS 0.7  --   EOSABS 0.5  --   BASOSABS 0.1  --     Chemistries  Recent Labs  Lab 12/02/2018 1110 12/05/18 0455  NA 139 141  K 4.9 4.7  CL 112* 114*  CO2 17* 17*  GLUCOSE 95 89  BUN 90* 89*  CREATININE 3.94* 4.07*  CALCIUM 8.8* 8.7*  AST 24  --   ALT 20  --   ALKPHOS 70  --   BILITOT 0.4  --    ------------------------------------------------------------------------------------------------------------------ No results for input(s): CHOL, HDL, LDLCALC, TRIG, CHOLHDL,  LDLDIRECT in the last 72 hours.  Lab Results  Component Value Date   HGBA1C 5.1 07/20/2018   ------------------------------------------------------------------------------------------------------------------ No results for input(s): TSH, T4TOTAL, T3FREE, THYROIDAB in the last 72 hours.  Invalid input(s): FREET3 ------------------------------------------------------------------------------------------------------------------ Recent Labs    12/01/2018 1110  VITAMINB12 500  FOLATE 19.4  FERRITIN 242  TIBC 266  IRON 12*  RETICCTPCT 2.5    Coagulation profile No results for input(s): INR, PROTIME in the last 168 hours.  No results for input(s): DDIMER in the last 72 hours.  Cardiac Enzymes No results for input(s): CKMB, TROPONINI, MYOGLOBIN in the last 168 hours.  Invalid input(s): CK ------------------------------------------------------------------------------------------------------------------  Component Value Date/Time   BNP 494.0 (H) 12/11/2018 1110     Roxan Hockey M.D on 12/05/2018 at 5:17 PM  Go to [email or web address] - for contact info  [facility] - Office  [phone]

## 2018-12-05 NOTE — Progress Notes (Signed)
opened in error

## 2018-12-05 NOTE — Consult Note (Signed)
Referring Provider: Roxan Hockey, MD Primary Care Physician:  [facility] (Dr. Maudie Mercury) Primary Gastroenterologist:  Barney Drain, MD  Reason for Consultation: Acute blood loss anemia  HPI: Cameron Boyle is a 83 y.o. male with past medical history significant for stroke (hospitalized in November 2019), hypertension, chronic kidney disease, anemia of chronic disease, grade 1 diastolic dysfunction who presented to the emergency department for outpatient labs drawn on November 28, 2018 with hemoglobin of 6.3.  Patient had routine blood work at that time.  He has noted increased generalized weakness and fatigue, dyspnea on exertion for the past couple of months.  He has had increased lower extremity edema.  Was on aspirin and Plavix for 3 weeks after stroke in November but then on Plavix monotherapy thereafter.  Hemoglobin 10.4 on July 22, 2018.  In the ED was 6.7.  Patient was heme negative in the emergency department.  Iron 12, TIBC 266, iron saturation 5%, ferritin 408, X44 818, folic acid 56.3.  He received 1 unit of packed red blood cells with hemoglobin up to 7.9.  Received Feraheme as well.  Chest x-ray with patchy interstitial and alveolar airspace disease in the upper and lower lobes bilaterally.  Concerning for interstitial pneumonitis secondary to infectious or inflammatory etiology versus mild pulmonary edema.  No reported melena or rectal bleeding.  No upper GI symptoms.  Denies abdominal pain.  Had some dysphagia symptoms after stroke but did rehabilitation and is doing well.  Appears he had a colonoscopy in 2008 however report cannot be found.  No recent colonoscopies.  Unaware of having upper endoscopy in the past.   Prior to Admission medications   Medication Sig Start Date End Date Taking? Authorizing Provider  amLODipine (NORVASC) 10 MG tablet Take 1 tablet (10 mg total) by mouth daily. 07/23/18  Yes Donzetta Starch, NP  atorvastatin (LIPITOR) 20 MG tablet Take 1 tablet (20 mg  total) by mouth daily at 6 PM. 07/22/18  Yes Donzetta Starch, NP  hydrALAZINE (APRESOLINE) 25 MG tablet Take 1 tablet (25 mg total) by mouth every 8 (eight) hours. Patient taking differently: Take 50 mg by mouth every 8 (eight) hours.  07/22/18  Yes Donzetta Starch, NP  Multiple Vitamins-Minerals (MENS MULTIPLUS PO) Take 1 tablet by mouth daily.     Yes [provider]  clopidogrel (PLAVIX) 75 MG tablet Take 1 tablet (75 mg total) by mouth daily. Patient not taking: Reported on 11/24/2018 07/23/18   Donzetta Starch, NP            Current Facility-Administered Medications  Medication Dose Route Frequency Provider Last Rate Last Dose  . 0.9 %  sodium chloride infusion  250 mL Intravenous PRN Tat, David, MD      . 0.9 %  sodium chloride infusion   Intravenous PRN Orson Eva, MD   Stopped at 12/09/2018 2229  . acetaminophen (TYLENOL) tablet 650 mg  650 mg Oral Q4H PRN Tat, David, MD      . amLODipine (NORVASC) tablet 10 mg  10 mg Oral Daily Tat, David, MD   10 mg at 12/17/2018 1749  . atorvastatin (LIPITOR) tablet 20 mg  20 mg Oral q1800 Tat, David, MD   20 mg at 12/14/2018 1748  . furosemide (LASIX) injection 40 mg  40 mg Intravenous Daily Tat, David, MD   40 mg at 12/09/2018 1742  . hydrALAZINE (APRESOLINE) injection 10 mg  10 mg Intravenous Q6H PRN Orson Eva, MD      .  hydrALAZINE (APRESOLINE) tablet 25 mg  25 mg Oral Franco Collet, MD   25 mg at 12/05/18 0558  . ondansetron (ZOFRAN) injection 4 mg  4 mg Intravenous Q6H PRN Tat, David, MD      . sodium chloride flush (NS) 0.9 % injection 3 mL  3 mL Intravenous Therisa Doyne, MD   3 mL at 11/30/2018 2227  . sodium chloride flush (NS) 0.9 % injection 3 mL  3 mL Intravenous PRN Orson Eva, MD        Allergies as of 12/12/2018  . (No Known Allergies)    Past Medical History:  Diagnosis Date  . Arthritis    left knee  . Stroke [facility])    07/18/18    Past Surgical History:  Procedure Laterality Date  . APPENDECTOMY    . left knee surgery     . LOOP RECORDER INSERTION N/A 07/22/2018   Procedure: LOOP RECORDER INSERTION;  Surgeon: Evans Lance, MD;  Location: [place] CV LAB;  Service: Cardiovascular;  Laterality: N/A;  . TEE WITHOUT CARDIOVERSION N/A 07/22/2018   Procedure: TRANSESOPHAGEAL ECHOCARDIOGRAM (TEE);  Surgeon: Dorothy Spark, MD;  Location: [facility] ENDOSCOPY;  Service: Cardiovascular;  Laterality: N/A;  . TONSILLECTOMY      History reviewed. No pertinent family history.  Social History   Socioeconomic History  . Marital status: Married    Spouse name: Not on file  . Number of children: Not on file  . Years of education: Not on file  . Highest education level: Not on file  Occupational History  . Not on file  Social Needs  . Financial resource strain: Not on file  . Food insecurity:    Worry: Not on file    Inability: Not on file  . Transportation needs:    Medical: Not on file    Non-medical: Not on file  Tobacco Use  . Smoking status: Former Research scientist (life sciences)  . Smokeless tobacco: Never Used  Substance and Sexual Activity  . Alcohol use: No  . Drug use: No  . Sexual activity: Not on file  Lifestyle  . Physical activity:    Days per week: Not on file    Minutes per session: Not on file  . Stress: Not on file  Relationships  . Social connections:    Talks on phone: Not on file    Gets together: Not on file    Attends religious service: Not on file    Active member of club or organization: Not on file    Attends meetings of clubs or organizations: Not on file    Relationship status: Not on file  . Intimate partner violence:    Fear of current or ex partner: Not on file    Emotionally abused: Not on file    Physically abused: Patient refused    Forced sexual activity: Not on file  Other Topics Concern  . Not on file  Social History Narrative  . Not on file     ROS:  General: Negative for anorexia, weight loss, fever, chills, positive fatigue, positive weakness. Eyes: Negative for vision  changes.  ENT: Negative for hoarseness, difficulty swallowing , nasal congestion. CV: Negative for chest pain, angina, palpitations, dyspnea on exertion, positive peripheral edema on admission but now resolved.  Respiratory: Negative for dyspnea at rest, dyspnea on exertion, cough, sputum, wheezing.  GI: See history of present illness. GU:  Negative for dysuria, hematuria, urinary incontinence, urinary frequency, nocturnal urination.  MS: Negative for  joint pain, low back pain.  Derm: Negative for rash or itching.  Neuro: Negative for weakness, abnormal sensation, seizure, frequent headaches, memory loss, confusion.  Psych: Negative for anxiety, depression, suicidal ideation, hallucinations.  Endo: Negative for unusual weight change.  Heme: Negative for bruising or bleeding. Allergy: Negative for rash or hives.       Physical Examination: Vital signs in last 24 hours: Temp:  [97.8 F (36.6 C)-99.3 F (37.4 C)] 99.3 F (37.4 C) (03/19 0547) Pulse Rate:  [65-101] 101 (03/19 0547) Resp:  [12-20] 16 (03/19 0547) BP: (148-192)/(68-85) 150/70 (03/19 0547) SpO2:  [92 %-97 %] 93 % (03/19 0547) Weight:  [61.2 kg-62.1 kg] 61.4 kg (03/19 0500) Last BM Date: 12/03/18  General: Well-nourished, well-developed in no acute distress.  Head: Normocephalic, atraumatic.   Eyes: Conjunctiva pink, no icterus. Mouth: Oropharyngeal mucosa moist and pink , no lesions erythema or exudate. Neck: Supple without thyromegaly, masses, or lymphadenopathy.  Lungs: Clear to auscultation bilaterally.  Heart: Regular rate and rhythm, no murmurs rubs or gallops.  Abdomen: Bowel sounds are normal, nontender, nondistended, no hepatosplenomegaly or masses, no abdominal bruits or    hernia , no rebound or guarding.   Rectal: Not performed Extremities: No lower extremity edema, clubbing, deformity.  Neuro: Alert and oriented x 4 , grossly normal neurologically.  Skin: Warm and dry, no rash or jaundice.   Psych: Alert  and cooperative, normal mood and affect.        Intake/Output from previous day: 03/18 0701 - 03/19 0700 In: 553 [P.O.:120; I.V.:68; Blood:365] Out: 1400 [Urine:1400] Intake/Output this shift: No intake/output data recorded.  Lab Results: CBC Recent Labs    11/26/2018 1110 12/05/18 0455  WBC 8.3 11.5*  HGB 6.7* 7.9*  HCT 21.6* 24.7*  MCV 93.5 90.5  PLT 190 174   BMET Recent Labs    12/17/2018 1110 12/05/18 0455  NA 139 141  K 4.9 4.7  CL 112* 114*  CO2 17* 17*  GLUCOSE 95 89  BUN 90* 89*  CREATININE 3.94* 4.07*  CALCIUM 8.8* 8.7*   LFT Recent Labs    12/10/2018 1110  BILITOT 0.4  ALKPHOS 70  AST 24  ALT 20  PROT 7.5  ALBUMIN 3.4*   Lab Results  Component Value Date   IRON 12 (L) 12/07/2018   TIBC 266 12/12/2018   FERRITIN 242 11/30/2018   Lab Results  Component Value Date   VITAMINB12 500 11/23/2018   Lab Results  Component Value Date   FOLATE 19.4 11/28/2018    Lipase No results for input(s): LIPASE in the last 72 hours.  PT/INR No results for input(s): LABPROT, INR in the last 72 hours.    Imaging Studies: Dg Chest 2 View  Result Date: 11/18/2018 CLINICAL DATA:  Cough EXAM: CHEST - 2 VIEW COMPARISON:  09/02/2013 FINDINGS: There are patchy areas of interstitial and alveolar airspace disease in the upper and lower lobes bilaterally concerning for interstitial pneumonitis secondary to an infectious or inflammatory etiology versus mild pulmonary edema. There is no pleural effusion or pneumothorax. The heart and mediastinal contours are unremarkable. The osseous structures are unremarkable. IMPRESSION: Patchy areas of interstitial and alveolar airspace disease in the upper and lower lobes bilaterally concerning for interstitial pneumonitis secondary to an infectious or inflammatory etiology versus mild pulmonary edema. Electronically Signed   By: Kathreen Devoid   On: 11/28/2018 11:46  [4 week]   Impression: Very pleasant 83 year old gentleman  presenting for symptomatic normocytic anemia.  Hospitalized back in  November with cryptogenic stroke, initially on dual therapy with Plavix and aspirin and subsequently on Plavix only.  At time of discharge on November 4 th his hemoglobin was 10.4.  Presented yesterday with a hemoglobin of 6.7.  Had noted progressive weakness, lower extremity edema but no overt melena, rectal bleeding.  Stool heme-negative here.  Received 1 unit of packed red blood cells, 1 dose of Feraheme.  Remains on Plavix.  Hemoglobin up to 7.9 today.  Suspect occult GI bleeding in the setting of Plavix.  Plan: 1. Discussed with Dr. Oneida Alar.  Plan for colonoscopy and upper endoscopy tomorrow. I have discussed the risks, alternatives, benefits with regards to but not limited to the risk of reaction to medication, bleeding, infection, perforation and the patient is agreeable to proceed. Written consent to be obtained. 2. Add PPI BID.  3. Clear liquid diet, NPO after midnight.   We would like to thank you for the opportunity to participate in the care of Cameron Boyle.  Laureen Ochs. Bernarda Caffey [facility] [phone] 3/19/20201:03 PM     LOS: 1 day

## 2018-12-05 NOTE — Plan of Care (Signed)
  Problem: Acute Rehab PT Goals(only PT should resolve) Goal: Patient Will Transfer Sit To/From Stand Flowsheets (Taken 12/05/2018 [phone]) Patient will transfer sit to/from stand: Independently Goal: Pt Will Ambulate Flowsheets (Taken 12/05/2018 0859) Pt will Ambulate: > 125 feet; with modified independence    Geraldine Solar PT, DPT

## 2018-12-05 NOTE — Progress Notes (Signed)
*  PRELIMINARY RESULTS* Echocardiogram 2D Echocardiogram has been performed.  Cameron Boyle 12/05/2018, 11:24 AM

## 2018-12-05 NOTE — Plan of Care (Addendum)
Notifed C bodenheimer of pt inability to complete prep for procedure. Pt continues to cough profusely after drinking liquid. Will keep NPO at this time. Also notified of recent CXR results. Will continue to monitor at this time.

## 2018-12-05 NOTE — Progress Notes (Signed)
Notified C Bodenheimer of pt increased coughing. Appears that pt coughing has increased significantly after initiation of golytely oral prep. New orders for concern of aspiration. Will continue to monitor

## 2018-12-05 NOTE — Evaluation (Signed)
Physical Therapy Evaluation Patient Details Name: Cameron Boyle MRN: [id number] DOB: March 16, 1936 Today's Date: 12/05/2018   History of Present Illness  Cameron Boyle is a 83 y.o. male with medical history of diastolic dysfunction, right MCA stroke, hypertension, hyperlipidemia, CKD stage IV presenting with 1 to 60-month history of dyspnea on exertion.  Patient had routine blood work obtained by his PCP on 11/28/2018.  His hemoglobin was 6.3.  However further history reveals that the patient has had increasing generalized weakness, fatigue, and dyspnea on exertion for nearly 2 months.  He also complains of increasing lower extremity edema.  He denies any frank PND orthopnea.  He denies any chest pain, fevers, chills, coughing, hemoptysis.  There is been no hematochezia, melena, hematemesis, hematuria.  Clinical Impression  Pt received in bed and was agreeable to PT evaluation. PTA pt from home with wife, independent with all ADLs, IADLs, ambulation without AD for unlimited distances, and still driving. Pt admitted with above diagnosis and gradual worsening fatigue over last few months. Pt currently mod I for bed mobility, mod I- supv for transfers, and CGA-supv for ambulation without AD x87ft. Pt with 2 bouts of staggering/unsteadiness during gait but he was able to maintain balance with CGA and he self-limited ambulation distance due to fatigue. PT recommending HHPT once pt medically ready for d/c to further improve his strength, endurance, and balance, to promote return to PLOF.     Follow Up Recommendations Home health PT;Supervision - Intermittent    Equipment Recommendations  None recommended by PT    Recommendations for Other Services       Precautions / Restrictions Precautions Precautions: Fall Precaution Comments: pt slightly stumbly during gait, CGA to maintain balance - h/o CVA which affected L side but no falls in last few months Restrictions Weight Bearing Restrictions: No       Mobility  Bed Mobility Overal bed mobility: Modified Independent                Transfers Overall transfer level: Needs assistance   Transfers: Sit to/from Stand Sit to Stand: Modified independent (Device/Increase time);Supervision            Ambulation/Gait Ambulation/Gait assistance: Supervision;Min guard Gait Distance (Feet): 75 Feet Assistive device: None Gait Pattern/deviations: Step-through pattern;Staggering left;Staggering right     General Gait Details: 2 staggers/unsteadiness bouts during gait without AD; able to maintain balance with CGA; limited due to fatigue  Stairs            Wheelchair Mobility    Modified Rankin (Stroke Patients Only)       Balance Overall balance assessment: Needs assistance Sitting-balance support: Feet supported Sitting balance-Leahy Scale: Good     Standing balance support: No upper extremity supported;During functional activity Standing balance-Leahy Scale: Fair Standing balance comment: 2 bouts of unsteadiness during gait, recovered with CGA                             Pertinent Vitals/Pain Pain Assessment: No/denies pain    Home Living Family/patient expects to be discharged to:: Private residence Living Arrangements: Spouse/significant other Available Help at Discharge: Family;Available 24 hours/day Type of Home: House Home Access: Stairs to enter Entrance Stairs-Rails: Left Entrance Stairs-Number of Steps: 2 Home Layout: Laundry or work area in basement;Able to live on main level with bedroom/bathroom(one level with basement) Home Equipment: Environmental consultant - 2 wheels;Cane - quad;Cane - single point;Walker - 4 wheels;Hospital bed;Shower seat;Toilet riser;Wheelchair - manual  Prior Function Level of Independence: Independent         Comments: independent with all IADLs and ADLs, still driving     Hand Dominance   Dominant Hand: Left    Extremity/Trunk Assessment   Upper Extremity  Assessment Upper Extremity Assessment: Overall WFL for tasks assessed    Lower Extremity Assessment Lower Extremity Assessment: Generalized weakness    Cervical / Trunk Assessment Cervical / Trunk Assessment: Normal  Communication   Communication: No difficulties;HOH  Cognition Arousal/Alertness: Awake/alert Behavior During Therapy: WFL for tasks assessed/performed Overall Cognitive Status: Within Functional Limits for tasks assessed                                        General Comments      Exercises     Assessment/Plan    PT Assessment Patient needs continued PT services  PT Problem List Decreased strength;Decreased activity tolerance;Decreased balance       PT Treatment Interventions DME instruction;Gait training;Stair training;Functional mobility training;Therapeutic activities;Therapeutic exercise;Balance training;Neuromuscular re-education;Patient/family education;Manual techniques    PT Goals (Current goals can be found in the Care Plan section)  Acute Rehab PT Goals Patient Stated Goal: to go home PT Goal Formulation: With patient Time For Goal Achievement: 12/12/18 Potential to Achieve Goals: Good    Frequency Min 2X/week   Barriers to discharge        Co-evaluation               AM-PAC PT "6 Clicks" Mobility  Outcome Measure Help needed turning from your back to your side while in a flat bed without using bedrails?: None Help needed moving from lying on your back to sitting on the side of a flat bed without using bedrails?: None Help needed moving to and from a bed to a chair (including a wheelchair)?: A Little Help needed standing up from a chair using your arms (e.g., wheelchair or bedside chair)?: None Help needed to walk in hospital room?: A Little Help needed climbing 3-5 steps with a railing? : A Little 6 Click Score: 21    End of Session Equipment Utilized During Treatment: Gait belt Activity Tolerance: Patient  tolerated treatment well;Patient limited by fatigue Patient left: in bed;with call bell/phone within reach;with bed alarm set Nurse Communication: Mobility status(mobility sheet left in room) PT Visit Diagnosis: Unsteadiness on feet (R26.81);Muscle weakness (generalized) (M62.81)    Time: 1062-6948 PT Time Calculation (min) (ACUTE ONLY): 21 min   Charges:   PT Evaluation $PT Eval Low Complexity: 1 Low             Geraldine Solar PT, DPT

## 2018-12-05 NOTE — TOC Initial Note (Signed)
Transition of Care [facility]) - Initial/Assessment Note    Patient Details  Name: Cameron Boyle MRN: [id number] Date of Birth: 06-12-1936  [facility] CM/SW Contact:    Trinitee Horgan, Chauncey Reading, RN Phone Number: 12/05/2018, 11:08 AM  Clinical Narrative:        Patient from home with wife. Independent. No DME. Seen by PT, recommended for home health PT. Patient agreeable. His wife has had [facility] in the past, he elects [facility]. CMS list provided. Linda of [facility] notified of referral.            Expected Discharge Plan: [place] Barriers to Discharge: No Barriers Identified   Patient Goals and CMS Choice Patient states their goals for this hospitalization and ongoing recovery are:: to get home with his wife   Choice offered to / list presented to : Patient  Expected Discharge Plan and Services Expected Discharge Plan: [place] Discharge Planning Services: CM Consult Post Acute Care Choice: Home Health                       HH Arranged: PT [facility] Agency: [facility])  Prior Living Arrangements/Services   Lives with:: Spouse   Do you feel safe going back to the place where you live?: Yes      Need for Family Participation in Patient Care: Yes (Comment) Care giver support system in place?: Yes (comment)      Activities of Daily Living Home Assistive Devices/Equipment: None ADL Screening (condition at time of admission) Patient's cognitive ability adequate to safely complete daily activities?: Yes Is the patient deaf or have difficulty hearing?: No Does the patient have difficulty seeing, even when wearing glasses/contacts?: No Does the patient have difficulty concentrating, remembering, or making decisions?: No Patient able to express need for assistance with ADLs?: Yes Does the patient have difficulty dressing or bathing?: No Independently performs ADLs?: Yes (appropriate for developmental age) Does the patient have  difficulty walking or climbing stairs?: No Weakness of Legs: Left Weakness of Arms/Hands: None      Emotional Assessment Appearance:: Appears stated age Attitude/Demeanor/Rapport: Gracious, Engaged Affect (typically observed): Accepting Orientation: : Oriented to Self, Oriented to Place, Oriented to  Time, Oriented to Situation      Admission diagnosis:  Symptomatic anemia [D64.9] Patient Active Problem List   Diagnosis Date Noted  . Acute blood loss anemia 12/11/2018  . Acute on chronic diastolic CHF (congestive heart failure) ([place]) 12/14/2018  . Acute renal failure superimposed on stage 4 chronic kidney disease ([place]) 12/10/2018  . Symptomatic anemia   . Essential hypertension 07/22/2018  . Hyperlipidemia 07/22/2018  . CKD (chronic kidney disease) 07/22/2018  . Anemia likely d/t chronic kidney disease 07/22/2018  . CVA (cerebral vascular accident) [facility]) - R MCA s/p tPA, embolic, unknown source 40/98/1191   PCP:  [name], MD (Inactive) Pharmacy:   [id number] - [place], [address] [place] [address] Phone: [phone] Fax: [phone]   Readmission Risk Interventions 30 Day Unplanned Readmission Risk Score     ED to Hosp-Admission (Current) from 11/28/2018 in [place]  30 Day Unplanned Readmission Risk Score (%)  17 Filed at 12/05/2018 0801     This score is the patient's risk of an unplanned readmission within 30 days of being discharged (0 -100%). The score is based on dignosis, age, lab data, medications, orders, and  past utilization.   Low:  0-14.9   Medium: 15-21.9   High: 22-29.9   Extreme: 30 and above       No flowsheet data found.

## 2018-12-06 ENCOUNTER — Encounter (HOSPITAL_COMMUNITY): Admission: EM | Disposition: E | Payer: Self-pay | Source: Ambulatory Visit | Attending: Family Medicine

## 2018-12-06 DIAGNOSIS — R05 Cough: Secondary | ICD-10-CM

## 2018-12-06 DIAGNOSIS — D649 Anemia, unspecified: Secondary | ICD-10-CM

## 2018-12-06 DIAGNOSIS — J690 Pneumonitis due to inhalation of food and vomit: Secondary | ICD-10-CM

## 2018-12-06 DIAGNOSIS — I5033 Acute on chronic diastolic (congestive) heart failure: Secondary | ICD-10-CM

## 2018-12-06 DIAGNOSIS — R059 Cough, unspecified: Secondary | ICD-10-CM

## 2018-12-06 LAB — CBC
HCT: 24.1 % — ABNORMAL LOW (ref 39.0–52.0)
Hemoglobin: 7.5 g/dL — ABNORMAL LOW (ref 13.0–17.0)
MCH: 29 pg (ref 26.0–34.0)
MCHC: 31.1 g/dL (ref 30.0–36.0)
MCV: 93.1 fL (ref 80.0–100.0)
Platelets: 187 10*3/uL (ref 150–400)
RBC: 2.59 MIL/uL — ABNORMAL LOW (ref 4.22–5.81)
RDW: 14.7 % (ref 11.5–15.5)
WBC: 14.2 10*3/uL — ABNORMAL HIGH (ref 4.0–10.5)
nRBC: 0 % (ref 0.0–0.2)

## 2018-12-06 LAB — BASIC METABOLIC PANEL
Anion gap: 11 (ref 5–15)
BUN: 90 mg/dL — ABNORMAL HIGH (ref 8–23)
CO2: 17 mmol/L — ABNORMAL LOW (ref 22–32)
Calcium: 8.3 mg/dL — ABNORMAL LOW (ref 8.9–10.3)
Chloride: 114 mmol/L — ABNORMAL HIGH (ref 98–111)
Creatinine, Ser: 4.66 mg/dL — ABNORMAL HIGH (ref 0.61–1.24)
GFR calc Af Amer: 13 mL/min — ABNORMAL LOW (ref >60)
GFR calc non Af Amer: 11 mL/min — ABNORMAL LOW (ref >60)
Glucose, Bld: 98 mg/dL (ref 70–99)
POTASSIUM: 4.6 mmol/L (ref 3.5–5.1)
Sodium: 142 mmol/L (ref 135–145)

## 2018-12-06 SURGERY — COLONOSCOPY
Anesthesia: Moderate Sedation

## 2018-12-06 MED ORDER — GUAIFENESIN ER 600 MG PO TB12
600.0000 mg | ORAL_TABLET | Freq: Two times a day (BID) | ORAL | Status: DC
  Administered 2018-12-06 – 2018-12-12 (×11): 600 mg via ORAL
  Filled 2018-12-06 (×11): qty 1

## 2018-12-06 MED ORDER — ALBUTEROL SULFATE (2.5 MG/3ML) 0.083% IN NEBU
2.5000 mg | INHALATION_SOLUTION | Freq: Four times a day (QID) | RESPIRATORY_TRACT | Status: DC
  Administered 2018-12-06 (×2): 2.5 mg via RESPIRATORY_TRACT
  Filled 2018-12-06: qty 3

## 2018-12-06 MED ORDER — ALBUTEROL SULFATE (2.5 MG/3ML) 0.083% IN NEBU
2.5000 mg | INHALATION_SOLUTION | Freq: Three times a day (TID) | RESPIRATORY_TRACT | Status: DC
  Administered 2018-12-06 – 2018-12-15 (×26): 2.5 mg via RESPIRATORY_TRACT
  Filled 2018-12-06 (×26): qty 3

## 2018-12-06 MED ORDER — FUROSEMIDE 10 MG/ML IJ SOLN
20.0000 mg | Freq: Once | INTRAMUSCULAR | Status: AC
  Administered 2018-12-06: 20 mg via INTRAVENOUS
  Filled 2018-12-06: qty 2

## 2018-12-06 MED ORDER — DEXTROSE-NACL 5-0.45 % IV SOLN
INTRAVENOUS | Status: DC
  Administered 2018-12-06 – 2018-12-11 (×8): via INTRAVENOUS

## 2018-12-06 MED ORDER — SODIUM CHLORIDE 0.9 % IV SOLN
500.0000 mg | INTRAVENOUS | Status: DC
  Administered 2018-12-06: 10:00:00 via INTRAVENOUS
  Administered 2018-12-07 – 2018-12-09 (×3): 500 mg via INTRAVENOUS
  Filled 2018-12-06 (×4): qty 500

## 2018-12-06 MED ORDER — SODIUM CHLORIDE 0.9 % IV SOLN
1.0000 g | INTRAVENOUS | Status: DC
  Administered 2018-12-06 – 2018-12-09 (×4): 1 g via INTRAVENOUS
  Filled 2018-12-06 (×3): qty 10

## 2018-12-06 NOTE — Progress Notes (Signed)
Patient Demographics:    Cameron Boyle, is a 83 y.o. male, DOB - 18-Jul-1936, ZOX:[id number]  Admit date - 12/14/2018   Admitting Physician Orson Eva, MD  Outpatient Primary MD for the patient is [name], MD (Inactive)  LOS - 2   Chief Complaint  Patient presents with  . Abnormal Lab        Subjective:    Cameron Boyle today has no fevers, no emesis,  No chest pain,  resp concerns overnight... Shortness of breath and cough, WBC trending up, chest x-ray suggesting pneumonia  Assessment  & Plan :    Active Problems:   Acute blood loss anemia   Acute on chronic diastolic CHF (congestive heart failure) (HCC)   Acute renal failure superimposed on stage 4 chronic kidney disease (HCC)   Transfusion-dependent anemia   Cough   Symptomatic anemia   Aspiration pneumonia due to regurgitated food [facility])  Brief Summary:- 83 y.o. male with medical history of diastolic dysfunction, right MCA stroke, hypertension, hyperlipidemia, CKD stage IV presenting with 1 to 67-month history of dyspnea on exertion.   Admitted on 11/28/2018 with tachycardia, volume overload symptoms as well as hemoglobin dropping down to 6.7,    Plan:-  1)Acute on chronic chronic blood loss anemia -Hemoglobin 10.4 on 07/22/2018 --GI consult -Iron saturation 5%, ferritin 242 -Transfuse Feraheme x1 -Serum W11 914 -Folic acid 78.2 -Hemoglobin down to 7.5 from 7.9  (hgb was 6.7 prior to transfusion of 1 unit of packed cells).... GI service would like to hold off on possible EGD given respiratory concerns at this time   2)HFpEF/Acute on chronic diastolic CHF ---Improving on IV Lasix-- c/n continue IV Lasix, BMP on 12/03/2018 was 494, repeat BMP in a.m. -Daily weights -Accurate I's and O's -07/22/2018 echo EF 60-65%, no WMA, grade 1 DD  3)Acute on chronic renal failure--CKD stage IV -Baseline creatinine 2.9-3.1 -Presenting creatinine 3.94,  creatinine now up to 4.66 -AKI on CKD stage IV  Vs progression of underlying CKD--avoid nephrotoxic agents... Will have to tolerate/accept worsening renal function in order to adequately diurese patient   4)Essential Hypertension Stable -Continue amlodipine and hydralazine  5)Hyperlipidemia -Continue statin  6)History of right MCA stroke -Patient had loop recorder placed 07/22/18 -Holding Plavix temporarily given concerns about GI bleed awaiting EGD and colonoscopy on 12/11/2018  7) suspected bilateral pneumonia-WBC up to 14.2, repeat chest x-ray suggestive of bilateral pneumonia, treat empirically with IV Rocephin/azithromycin, mucolytics and bronchodilators as ordered  Disposition/Need for in-Hospital Stay- patient unable to be discharged at this time due to patient with CHF flareup/volume overload requiring IV Lasix, also has presumed GI Bleed with drop in H&H, patient had tachycardia requiring transfusion, awaiting EGD and colonoscopy on 12/13/2018, patient has now developed pneumonia requiring IV antibiotics with respiratory distress  Code Status : full   Family Communication:   na   Disposition Plan  : home with HH  Consults  :  Gi  DVT Prophylaxis  :  SCDs   Lab Results  Component Value Date   PLT 187 12/06/2018    Inpatient Medications  Scheduled Meds: . albuterol  2.5 mg Nebulization TID  . amLODipine  10 mg Oral Daily  . atorvastatin  20 mg Oral q1800  . clopidogrel  75  mg Oral Daily  . furosemide  20 mg Intravenous Once  . furosemide  40 mg Intravenous Daily  . guaiFENesin  600 mg Oral BID  . hydrALAZINE  25 mg Oral Q8H  . pantoprazole  40 mg Oral BID AC  . sodium chloride flush  3 mL Intravenous Q12H   Continuous Infusions: . sodium chloride    . sodium chloride Stopped (11/20/2018 2229)  . azithromycin 250 mL/hr at 12/10/2018 0952  . cefTRIAXone (ROCEPHIN)  IV 1 g (11/24/2018 0901)  . dextrose 5 % and 0.45% NaCl 125 mL/hr at 12/10/2018 0850   PRN  Meds:.sodium chloride, sodium chloride, acetaminophen, guaiFENesin-dextromethorphan, hydrALAZINE, ondansetron (ZOFRAN) IV, sodium chloride flush    Anti-infectives (From admission, onward)   Start     Dose/Rate Route Frequency Ordered Stop   11/30/2018 0930  azithromycin (ZITHROMAX) 500 mg in sodium chloride 0.9 % 250 mL IVPB     500 mg 250 mL/hr over 60 Minutes Intravenous Every 24 hours 12/04/2018 0752     12/09/2018 0900  cefTRIAXone (ROCEPHIN) 1 g in sodium chloride 0.9 % 100 mL IVPB     1 g 200 mL/hr over 30 Minutes Intravenous Every 24 hours 12/05/2018 0752          Objective:   Vitals:   11/18/2018 0812 12/16/2018 1052 12/16/2018 1219 11/25/2018 1401  BP:  134/63  (!) 148/73  Pulse:  95  94  Resp:  18  16  Temp:  98.7 F (37.1 C)  98.3 F (36.8 C)  TempSrc:  Oral  Oral  SpO2: 91% 93% 91% 92%  Weight:      Height:        Wt Readings from Last 3 Encounters:  12/01/2018 61.4 kg  08/20/18 63.5 kg  07/22/18 55.8 kg     Intake/Output Summary (Last 24 hours) at 12/11/2018 1801 Last data filed at 11/20/2018 1744 Gross per 24 hour  Intake 1139.41 ml  Output 1300 ml  Net -160.59 ml     Physical Exam Patient is examined daily including today on 12/13/2018, exams remain the same as of yesterday except that has changed   Gen:- Awake Alert,  In no apparent distress  HEENT:- [place].AT, No sclera icterus Neck-Supple Neck,No JVD,.  Lungs-diminished breath sounds in bases, bibasilar rales, no wheezing  CV- S1, S2 normal, regular  Abd-  +ve B.Sounds, Abd Soft, No tenderness,    Extremity/Skin:- + edema, pedal pulses present  Psych-affect is appropriate, oriented x3 Neuro-no new focal deficits, no tremors   Data Review:   Micro Results No results found for this or any previous visit (from the past 240 hour(s)).  Radiology Reports Dg Chest 2 View  Result Date: 11/17/2018 CLINICAL DATA:  Cough EXAM: CHEST - 2 VIEW COMPARISON:  09/02/2013 FINDINGS: There are patchy areas of interstitial  and alveolar airspace disease in the upper and lower lobes bilaterally concerning for interstitial pneumonitis secondary to an infectious or inflammatory etiology versus mild pulmonary edema. There is no pleural effusion or pneumothorax. The heart and mediastinal contours are unremarkable. The osseous structures are unremarkable. IMPRESSION: Patchy areas of interstitial and alveolar airspace disease in the upper and lower lobes bilaterally concerning for interstitial pneumonitis secondary to an infectious or inflammatory etiology versus mild pulmonary edema. Electronically Signed   By: Kathreen Devoid   On: 12/08/2018 11:46   Dg Chest Port 1 View  Result Date: 12/05/2018 CLINICAL DATA:  Shortness of breath, cough EXAM: PORTABLE CHEST 1 VIEW COMPARISON:  12/02/2018 FINDINGS: Patchy  airspace disease in both lungs have worsened since prior study concerning for multifocal pneumonia. Heart is borderline in size. No visible significant effusions or acute bony abnormality. Loop recorder device again noted, unchanged. IMPRESSION: Patchy bilateral airspace opacities worsening since prior study concerning for pneumonia. Electronically Signed   By: Rolm Baptise M.D.   On: 12/05/2018 20:29     CBC Recent Labs  Lab 12/10/2018 1110 12/05/18 0455 12/14/2018 0830  WBC 8.3 11.5* 14.2*  HGB 6.7* 7.9* 7.5*  HCT 21.6* 24.7* 24.1*  PLT 190 174 187  MCV 93.5 90.5 93.1  MCH 29.0 28.9 29.0  MCHC 31.0 32.0 31.1  RDW 14.4 14.7 14.7  LYMPHSABS 0.9  --   --   MONOABS 0.7  --   --   EOSABS 0.5  --   --   BASOSABS 0.1  --   --     Chemistries  Recent Labs  Lab 11/30/2018 1110 12/05/18 0455 12/08/2018 0426  NA 139 141 142  K 4.9 4.7 4.6  CL 112* 114* 114*  CO2 17* 17* 17*  GLUCOSE 95 89 98  BUN 90* 89* 90*  CREATININE 3.94* 4.07* 4.66*  CALCIUM 8.8* 8.7* 8.3*  AST 24  --   --   ALT 20  --   --   ALKPHOS 70  --   --   BILITOT 0.4  --   --     ------------------------------------------------------------------------------------------------------------------ No results for input(s): CHOL, HDL, LDLCALC, TRIG, CHOLHDL, LDLDIRECT in the last 72 hours.  Lab Results  Component Value Date   HGBA1C 5.1 07/20/2018   ------------------------------------------------------------------------------------------------------------------ No results for input(s): TSH, T4TOTAL, T3FREE, THYROIDAB in the last 72 hours.  Invalid input(s): FREET3 ------------------------------------------------------------------------------------------------------------------ Recent Labs    12/10/2018 1110  VITAMINB12 500  FOLATE 19.4  FERRITIN 242  TIBC 266  IRON 12*  RETICCTPCT 2.5    Coagulation profile No results for input(s): INR, PROTIME in the last 168 hours.  No results for input(s): DDIMER in the last 72 hours.  Cardiac Enzymes No results for input(s): CKMB, TROPONINI, MYOGLOBIN in the last 168 hours.  Invalid input(s): CK ------------------------------------------------------------------------------------------------------------------    Component Value Date/Time   BNP 494.0 (H) 11/20/2018 1110     Roxan Hockey M.D on 12/17/2018 at 6:01 PM  Go to [email or web address] - for contact info  [facility] - Office  [phone]

## 2018-12-06 NOTE — Progress Notes (Signed)
Dr fields notified of possible aspiration from Golytely. New consult for speech- swallow evaluation. Pt understanding of new plan of care. Monika Salk, wife also notified via phone and understanding of plan of care. Oncoming nurse updated as well.

## 2018-12-06 NOTE — Progress Notes (Signed)
Subjective: Feeling ok. No severe dyspnea. Intermittent wet cough. Still with some fatigue and weakness. Has a bowel movement this morning but didn't look at it (was assisted by nursing). Denies abdominal pain, N/V. No other GI complaints.  Objective: Vital signs in last 24 hours: Temp:  [98.4 F (36.9 C)-98.8 F (37.1 C)] 98.5 F (36.9 C) (03/20 0512) Pulse Rate:  [78-109] 94 (03/20 0512) Resp:  [18-20] 20 (03/20 0512) BP: (140-176)/(69-74) 140/71 (03/20 0512) SpO2:  [82 %-97 %] 91 % (03/20 0812) Weight:  [61.4 kg] 61.4 kg (03/20 0500) Last BM Date: 12/03/18 General:   Alert and oriented, pleasant Head:  Normocephalic and atraumatic. Eyes:  No icterus, sclera clear. Conjuctiva pink.  Heart:  S1, S2 present, no murmurs noted.  Lungs: Intermittent rhonchi, semi-frequent wet cough.  Abdomen:  Bowel sounds present, soft, non-tender, non-distended. No HSM or hernias noted. No rebound or guarding. Msk:  Symmetrical without gross deformities. Extremities:  Without clubbing or edema. Neurologic:  Alert and  oriented x4;  grossly normal neurologically. Psych:  Alert and cooperative. Normal mood and affect.  Intake/Output from previous day: 03/19 0701 - 03/20 0700 In: 483 [P.O.:480; I.V.:3] Out: 750 [Urine:750] Intake/Output this shift: No intake/output data recorded.  Lab Results: Recent Labs    11/19/2018 1110 12/05/18 0455 12/09/2018 0830  WBC 8.3 11.5* 14.2*  HGB 6.7* 7.9* 7.5*  HCT 21.6* 24.7* 24.1*  PLT 190 174 187   BMET Recent Labs    12/11/2018 1110 12/05/18 0455 11/29/2018 0426  NA 139 141 142  K 4.9 4.7 4.6  CL 112* 114* 114*  CO2 17* 17* 17*  GLUCOSE 95 89 98  BUN 90* 89* 90*  CREATININE 3.94* 4.07* 4.66*  CALCIUM 8.8* 8.7* 8.3*   LFT Recent Labs    11/17/2018 1110  PROT 7.5  ALBUMIN 3.4*  AST 24  ALT 20  ALKPHOS 70  BILITOT 0.4   PT/INR No results for input(s): LABPROT, INR in the last 72 hours. Hepatitis Panel No results for input(s): HEPBSAG,  HCVAB, HEPAIGM, HEPBIGM in the last 72 hours.   Studies/Results: Dg Chest 2 View  Result Date: 11/17/2018 CLINICAL DATA:  Cough EXAM: CHEST - 2 VIEW COMPARISON:  09/02/2013 FINDINGS: There are patchy areas of interstitial and alveolar airspace disease in the upper and lower lobes bilaterally concerning for interstitial pneumonitis secondary to an infectious or inflammatory etiology versus mild pulmonary edema. There is no pleural effusion or pneumothorax. The heart and mediastinal contours are unremarkable. The osseous structures are unremarkable. IMPRESSION: Patchy areas of interstitial and alveolar airspace disease in the upper and lower lobes bilaterally concerning for interstitial pneumonitis secondary to an infectious or inflammatory etiology versus mild pulmonary edema. Electronically Signed   By: Kathreen Devoid   On: 11/25/2018 11:46   Dg Chest Port 1 View  Result Date: 12/05/2018 CLINICAL DATA:  Shortness of breath, cough EXAM: PORTABLE CHEST 1 VIEW COMPARISON:  11/30/2018 FINDINGS: Patchy airspace disease in both lungs have worsened since prior study concerning for multifocal pneumonia. Heart is borderline in size. No visible significant effusions or acute bony abnormality. Loop recorder device again noted, unchanged. IMPRESSION: Patchy bilateral airspace opacities worsening since prior study concerning for pneumonia. Electronically Signed   By: Rolm Baptise M.D.   On: 12/05/2018 20:29    Assessment: 83 year old male presenting for symptomatic normocytic anemia.  Hospitalized back in November with cryptogenic stroke, initially on dual therapy with Plavix and aspirin and subsequently on Plavix only.  At time of  discharge on November 4 th his hemoglobin was 10.4.  Presented yesterday with a hemoglobin of 6.7.  Had noted progressive weakness, lower extremity edema but no overt melena, rectal bleeding.  Stool heme-negative here.  Received 1 unit of packed red blood cells, 1 dose of Feraheme.   Remains on Plavix.  Hemoglobin up to 7.9 yesterday and drift to 7.5 this morning.  Suspected occult GI bleeding in the setting of Plavix.  Planned colonoscopy/EGD today, however overnight the patient began having significant coughing with GoLytely prep. Chest XRay showed patchy bilateral airspace worsening since prior study concerning for pneumonia. Concerning for aspiration pna and patient unable to complete bowel prep so TCS/EGD was cancelled.  Today he has a persistent, intermittent wet cough and some rhonchi noted on exam. Unable to complete prep. Had a bowel movement this morning, didn't see it. Nursing states overnight nurse did not report +/- blood in stool. Otherwise asymptomatic from a GI standpoint. Hgb somewhat stable today per below, has had PRBC x 1 (from I/O flowsheet).  CBC Latest Ref Rng & Units 11/28/2018 12/05/2018 12/11/2018  WBC 4.0 - 10.5 K/uL 14.2(H) 11.5(H) 8.3  Hemoglobin 13.0 - 17.0 g/dL 7.5(L) 7.9(L) 6.7(LL)  Hematocrit 39.0 - 52.0 % 24.1(L) 24.7(L) 21.6(L)  Platelets 150 - 400 K/uL 187 174 190    Plan: 1. Continue to monitor for overt GI bleed and document results 2. Monitor H/H for stability, notify of significant drop 3. Supportive measures 4. Will discuss with Dr. Oneida Alar endoscopic plan: IP vs OP (hopsitalist wanting to consider d/c possibly tomorrow)   Thank you for allowing [place] to participate in the care of Cameron Boyle  Walden Field, DNP, AGNP-C Adult & Gerontological Nurse Practitioner [facility]     LOS: 2 days    12/17/2018, 8:51 AM

## 2018-12-06 NOTE — Progress Notes (Signed)
Physical Therapy Treatment Patient Details Name: Cameron Boyle: [id number] DOB: 09-Nov-1935 Today's Date: 11/23/2018    History of Present Illness Cameron Boyle is a 83 y.o. male with medical history of diastolic dysfunction, right MCA stroke, hypertension, hyperlipidemia, CKD stage IV presenting with 1 to 89-month history of dyspnea on exertion.  Patient had routine blood work obtained by his PCP on 11/28/2018.  His hemoglobin was 6.3.  However further history reveals that the patient has had increasing generalized weakness, fatigue, and dyspnea on exertion for nearly 2 months.  He also complains of increasing lower extremity edema.  He denies any frank PND orthopnea.  He denies any chest pain, fevers, chills, coughing, hemoptysis.  There is been no hematochezia, melena, hematemesis, hematuria.    PT Comments    Pt received in bed with granddaughter at bedside and was agreeable to PT treatment but declined ambulation this date due to R knee pain. Pt c/o posterior knee pain which he states started yesterday and states that walking will make it worse. PT assessed his knee and he was noted to have increased tightness of medial HS; educated pt on how to stretch HS when lying in supine and he and his granddaughter verbalized understanding. Pt completed bed level exercises well, just requiring min cues for form and he only had some pain with heel slides, but stated that it helped reduce his pain. Encouraged pt to ambulate with nursing later to try and help reduce his R knee pain. Continue to recommend venue below to continue to address his limitations in strength, endurance, and functional mobility to promote return to PLOF.       Follow Up Recommendations  Home health PT;Supervision - Intermittent     Equipment Recommendations  None recommended by PT    Recommendations for Other Services       Precautions / Restrictions Precautions Precautions: Fall Precaution Comments: pt slightly stumbly  during gait, CGA to maintain balance - h/o CVA which affected L side but no falls in last few months Restrictions Weight Bearing Restrictions: No    Mobility  Bed Mobility               General bed mobility comments: n/a this date as pt wished to remain in bed  Transfers                 General transfer comment: n/a this date as pt wished to remain in bed  Ambulation/Gait             General Gait Details: n/a this date as pt wished to remain in bed   Stairs             Wheelchair Mobility    Modified Rankin (Stroke Patients Only)       Balance                                            Cognition Arousal/Alertness: Awake/alert Behavior During Therapy: WFL for tasks assessed/performed Overall Cognitive Status: Within Functional Limits for tasks assessed                                        Exercises Total Joint Exercises Ankle Circles/Pumps: Strengthening;Both;10 reps;Supine Heel Slides: Strengthening;Both;10 reps;Supine;Limitations Heel Slides Limitations: Pain in posterior R  knee with this Hip ABduction/ADduction: Strengthening;Both;10 reps;Supine Straight Leg Raises: Strengthening;Both;10 reps;Supine    General Comments        Pertinent Vitals/Pain Pain Assessment: Faces Faces Pain Scale: Hurts even more Pain Location: posterior R knee Pain Descriptors / Indicators: Aching Pain Intervention(s): Limited activity within patient's tolerance;Monitored during session;Repositioned    Home Living                      Prior Function            PT Goals (current goals can now be found in the care plan section) Acute Rehab PT Goals Patient Stated Goal: to go home PT Goal Formulation: With patient Time For Goal Achievement: 12/12/18 Potential to Achieve Goals: Good    Frequency    Min 2X/week      PT Plan      Co-evaluation              AM-PAC PT "6 Clicks" Mobility    Outcome Measure  Help needed turning from your back to your side while in a flat bed without using bedrails?: None Help needed moving from lying on your back to sitting on the side of a flat bed without using bedrails?: None Help needed moving to and from a bed to a chair (including a wheelchair)?: A Little Help needed standing up from a chair using your arms (e.g., wheelchair or bedside chair)?: None Help needed to walk in hospital room?: A Little Help needed climbing 3-5 steps with a railing? : A Little 6 Click Score: 21    End of Session Equipment Utilized During Treatment: Gait belt Activity Tolerance: Patient tolerated treatment well;Patient limited by fatigue Patient left: in bed;with call bell/phone within reach;with bed alarm set Nurse Communication: Mobility status(mobility sheet left in room) PT Visit Diagnosis: Unsteadiness on feet (R26.81);Muscle weakness (generalized) (M62.81)     Time: 9892-1194 PT Time Calculation (min) (ACUTE ONLY): 9 min  Charges:  $Therapeutic Exercise: 8-22 mins                         Geraldine Solar PT, DPT

## 2018-12-07 ENCOUNTER — Inpatient Hospital Stay (HOSPITAL_COMMUNITY): Payer: Medicare Other

## 2018-12-07 DIAGNOSIS — D649 Anemia, unspecified: Secondary | ICD-10-CM

## 2018-12-07 DIAGNOSIS — J690 Pneumonitis due to inhalation of food and vomit: Secondary | ICD-10-CM

## 2018-12-07 LAB — CBC
HCT: 23.1 % — ABNORMAL LOW (ref 39.0–52.0)
Hemoglobin: 7.2 g/dL — ABNORMAL LOW (ref 13.0–17.0)
MCH: 29 pg (ref 26.0–34.0)
MCHC: 31.2 g/dL (ref 30.0–36.0)
MCV: 93.1 fL (ref 80.0–100.0)
Platelets: 151 10*3/uL (ref 150–400)
RBC: 2.48 MIL/uL — ABNORMAL LOW (ref 4.22–5.81)
RDW: 14.6 % (ref 11.5–15.5)
WBC: 9.5 10*3/uL (ref 4.0–10.5)
nRBC: 0 % (ref 0.0–0.2)

## 2018-12-07 LAB — BASIC METABOLIC PANEL
Anion gap: 10 (ref 5–15)
BUN: 87 mg/dL — ABNORMAL HIGH (ref 8–23)
CO2: 17 mmol/L — ABNORMAL LOW (ref 22–32)
Calcium: 7.9 mg/dL — ABNORMAL LOW (ref 8.9–10.3)
Chloride: 111 mmol/L (ref 98–111)
Creatinine, Ser: 4.93 mg/dL — ABNORMAL HIGH (ref 0.61–1.24)
GFR calc Af Amer: 12 mL/min — ABNORMAL LOW (ref >60)
GFR calc non Af Amer: 10 mL/min — ABNORMAL LOW (ref >60)
Glucose, Bld: 138 mg/dL — ABNORMAL HIGH (ref 70–99)
Potassium: 4 mmol/L (ref 3.5–5.1)
Sodium: 138 mmol/L (ref 135–145)

## 2018-12-07 LAB — BRAIN NATRIURETIC PEPTIDE: B Natriuretic Peptide: 239 pg/mL — ABNORMAL HIGH (ref 0.0–100.0)

## 2018-12-07 MED ORDER — HYDROCODONE-ACETAMINOPHEN 5-325 MG PO TABS
1.0000 | ORAL_TABLET | Freq: Four times a day (QID) | ORAL | Status: DC | PRN
  Administered 2018-12-07 – 2018-12-08 (×2): 2 via ORAL
  Administered 2018-12-09: 1 via ORAL
  Filled 2018-12-07: qty 2
  Filled 2018-12-07: qty 1
  Filled 2018-12-07: qty 2

## 2018-12-07 MED ORDER — RESOURCE THICKENUP CLEAR PO POWD
ORAL | Status: DC | PRN
  Filled 2018-12-07: qty 125

## 2018-12-07 NOTE — Evaluation (Signed)
Clinical/Bedside Swallow Evaluation Patient Details  Name: Cameron Boyle MRN: [id number] Date of Birth: [birth year]  Today's Date: 12/07/2018 Time: SLP Start Time (ACUTE ONLY): 1000 SLP Stop Time (ACUTE ONLY): 1027 SLP Time Calculation (min) (ACUTE ONLY): 27 min  Past Medical History:  Past Medical History:  Diagnosis Date  . Arthritis    left knee  . Stroke [facility])    07/18/18   Past Surgical History:  Past Surgical History:  Procedure Laterality Date  . APPENDECTOMY    . left knee surgery    . LOOP RECORDER INSERTION N/A 07/22/2018   Procedure: LOOP RECORDER INSERTION;  Surgeon: Evans Lance, MD;  Location: [place] CV LAB;  Service: Cardiovascular;  Laterality: N/A;  . TEE WITHOUT CARDIOVERSION N/A 07/22/2018   Procedure: TRANSESOPHAGEAL ECHOCARDIOGRAM (TEE);  Surgeon: Dorothy Spark, MD;  Location: [facility] ENDOSCOPY;  Service: Cardiovascular;  Laterality: N/A;  . TONSILLECTOMY     HPI:  83 year old male presenting for symptomaticnormocyticanemia. Hospitalized back in November with cryptogenic stroke, initially on dual therapy with Plavix and aspirin and subsequently on Plavix only. At time of discharge on November 4 thhis hemoglobin was 10.4. Presented yesterday with a hemoglobin of 6.7. Had noted progressive weakness, lower extremity edema but no overt melena, rectal bleeding. Stool heme-negative here. Received 1 unit of packed red blood cells, 1 dose of Feraheme. Remains on Plavix. Hemoglobin up to 7.9 yesterday and drift to 7.5 this morning.Suspected occult GI bleeding in the setting of Plavix. Planned colonoscopy/EGD today, however overnight the patient began having significant coughing with GoLytely prep. Chest XRay showed patchy bilateral airspace worsening since prior study concerning for pneumonia. Concerning for aspiration pna and patient unable to complete bowel prep so TCS/EGD was cancelled. BSE requested. Pt is known to this SLP from [place] (09/26/18) and outpatient  dysphagia therapy for mod/severe sensorimotor based pharyngeal dysphagia. Pt was discharged from outpatient therapy in February with recommendation to continue HEP indefinitely (Pt with reduced awareness of severity of dysphagia).   SLP discharge note from outpatient therapy February 2020: <<Pt seen for dysphagia therapy following MBSS completed last month. [name] reports good follow through of HEP and follows the written handout provided. He understands that he is being asked to complete these exercises on a daily basis for the rest of his life, as able. He reports an improvement in vocal quality and swallow ability, however it should be noted that he demonstrated reduced awareness to severity of dysphagia during MBSS. Pt with reduced wet vocal quality in sessions and improved cough. All recommendations and strategies were reviewed with Pt. He is not completing head turn with swallow despite recommendations for same. He was asked to notify his MD if he notes increased chest congestion, shortness of breath, and/or fever. Pt feels competent in completing his HEP and is discharged from SLP services at this time.>>  MBSS 09/26/2018:  <<Pt presents with mod/severe sensorimotor based pharyngeal phase dysphagia characterized by severely reduced pharyngeal pressure with reduced tongue based retraction, epiglottic deflection, posterior pharyngeal wall approximation, and laryngeal vestibule closure resulting in penetration/aspiration during and after the swallow with thin liquids and mod/severe pharyngeal residue post swallow. 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 Many strategies were trialed and the most effective was for Pt to turn his head to the RIGHT and swallow several times, occasionally alternating with head turn LEFT (worse with head center). Pt repotedly has been consuming a regular diet with thin  liquids since his stroke  at the end of October without diagnosis of upper respiratory infections or PNA. Pt's vocal quality is wet and is he unaware of this. Recommend D3/mech soft textures with thin liquids via cup sip with head turn to the RIGHT for all swallows, good oral care before and after meals, and dysphagia therapy. Pt is agreeable to plan of care.>>   Assessment / Plan / Recommendation Clinical Impression  Clinical swallow evaluation completed at bedside. Pt is known to this SLP from recent MBSS and dysphagia therapy. Pt with known mod/severe pharyngeal phase dysphagia and at risk for aspiration (see MBSS above). Unfortunately, Pt has reduced awareness for wet vocal quality (suspect the chronic nature of his dysphagia has led to some desensitization) and reduced insight as to the severity of his dysphagia and needs reminders for strategies above. Given current decompensation, it is recommended that liquids be thickened to nectar-thick. Pt assessed at bedside and presents with wet vocal quality which improves following verbal cues for a strong cough and dry swallow. Pt consumed NTL, puree, and D3 trials with moderate verbal cues for implementation of strategies previously taught from outpatient. Recommend D2 and NTL with head turn to the RIGHT for swallows of solids/puree, repeat swallow, and clear throat intermittently. OK for po meds whole with NTL or in puree. Swallow strategies placed at head of bed and on table for Pt to use during meals. SLP will follow.   SLP Visit Diagnosis: Dysphagia, oropharyngeal phase (R13.12)    Aspiration Risk  Moderate aspiration risk    Diet Recommendation Dysphagia 2 (Fine chop);Nectar-thick liquid   Liquid Administration via: No straw Medication Administration: Whole meds with liquid Supervision: Patient able to self feed;Intermittent supervision to cue for compensatory strategies Compensations: Slow rate;Small sips/bites;Multiple dry swallows after each bite/sip;Clear throat  intermittently(head turn to RIGHT when swallowing solids/puree) Postural Changes: Seated upright at 90 degrees;Remain upright for at least 30 minutes after po intake    Other  Recommendations Oral Care Recommendations: Oral care BID;Staff/trained caregiver to provide oral care Other Recommendations: Order thickener from pharmacy;Prohibited food (jello, ice cream, thin soups);Clarify dietary restrictions   Follow up Recommendations (pending)      Frequency and Duration min 2x/week  1 week       Prognosis Prognosis for Safe Diet Advancement: Fair Barriers to Reach Goals: Severity of deficits      Swallow Study   General Date of Onset: 11/19/2018 HPI: 83 year old male presenting for symptomaticnormocyticanemia. Hospitalized back in November with cryptogenic stroke, initially on dual therapy with Plavix and aspirin and subsequently on Plavix only. At time of discharge on November 4 thhis hemoglobin was 10.4. Presented yesterday with a hemoglobin of 6.7. Had noted progressive weakness, lower extremity edema but no overt melena, rectal bleeding. Stool heme-negative here. Received 1 unit of packed red blood cells, 1 dose of Feraheme. Remains on Plavix. Hemoglobin up to 7.9 yesterday and drift to 7.5 this morning.Suspected occult GI bleeding in the setting of Plavix. Planned colonoscopy/EGD today, however overnight the patient began having significant coughing with GoLytely prep. Chest XRay showed patchy bilateral airspace worsening since prior study concerning for pneumonia. Concerning for aspiration pna and patient unable to complete bowel prep so TCS/EGD was cancelled. BSE requested. Pt is known to this SLP from [place] (09/26/18) and outpatient dysphagia therapy for mod/severe sensorimotor based pharyngeal dysphagia. Pt was discharged from outpatient therapy in February with recommendation to continue HEP indefinitely (Pt with reduced awareness of severity of dysphagia). Type of Study:  Bedside Swallow Evaluation Previous Swallow Assessment: MBSS 09/26/18 Diet Prior to this Study: Dysphagia 2 (chopped);Nectar-thick liquids(changed to this yesterday) Temperature Spikes Noted: No Respiratory Status: Nasal cannula History of Recent Intubation: No Behavior/Cognition: Alert;Cooperative;Pleasant mood Oral Cavity Assessment: Within Functional Limits Oral Care Completed by SLP: Recent completion by staff Oral Cavity - Dentition: Adequate natural dentition;Missing dentition Vision: Functional for self-feeding Self-Feeding Abilities: Able to feed self Patient Positioning: Upright in bed Baseline Vocal Quality: Wet Volitional Cough: Strong;Congested Volitional Swallow: Able to elicit    Oral/Motor/Sensory Function Overall Oral Motor/Sensory Function: Within functional limits   Ice Chips Ice chips: Within functional limits Presentation: Spoon   Thin Liquid Thin Liquid: Not tested    Nectar Thick Nectar Thick Liquid: Within functional limits Presentation: Cup;Self Fed   Honey Thick Honey Thick Liquid: Not tested   Puree Puree: Impaired Presentation: Spoon Pharyngeal Phase Impairments: Multiple swallows   Solid     Solid: Impaired Presentation: Spoon Pharyngeal Phase Impairments: Multiple swallows;Throat Clearing - Delayed     Thank you,  [name] Churn, [place]  [place] 12/07/2018,10:44 AM

## 2018-12-07 NOTE — Progress Notes (Signed)
Assessment/Plan: Admitted with anemia. ASPIRATED DURING BOWEL PREP AND SPIKED TEMP OVER LAST 24 HRS. CLINICALLY STABLE. BEING HYDRATED AND Cr INCREASING.  PLAN: 1. CONTINUE ABX. 2. PROTONIX DAILY. 3. SUPPORTIVE CARE. 4. PLAN OPV IN 4-6 WEEKS FOLLOWED BY EGD/TCS WITH MIRALAX PREP(NECTAR THICK LIQUIDS).   Subjective: Since I last evaluated the patient HE DENIES CHEST PAIN, SOB, ABDOMINAL PAIN, BRBPR, OR MELENA. HE HAS HAD SOFT BROWN STOOLS.   Objective: Vital signs in last 24 hours: Vitals:   12/07/18 0559 12/07/18 0856  BP: 133/68   Pulse: 82   Resp:    Temp: 98.4 F (36.9 C)   SpO2: 96% 91%   General appearance: alert and cooperative, NO APPARENT DISTRESS Resp: COARSE BREATH SOUNDS bilaterally, GOOD AIR MOVEMENT BILATERALLY Cardio: regular rate and rhythm GI: soft, non-tender; bowel sounds normal;   Lab Results:   Cr 4.93 K 4.0 WBC 9.5 Hb 7.2 PLT CT 151   Studies/Results: Dg Chest Port 1 View  Result Date: 12/07/2018 CLINICAL DATA:  Dyspnea. EXAM: PORTABLE CHEST 1 VIEW COMPARISON:  12/05/2018 FINDINGS: Exam demonstrates persistent bilateral multifocal airspace opacification with possible slight worsening over the right mid to lower lung. No effusion. Cardiomediastinal silhouette and remainder of the exam is unchanged. IMPRESSION: Bilateral multifocal airspace process with slight worsening over the right mid to lower lung likely multifocal pneumonia. Electronically Signed   By: Marin Olp M.D.   On: 12/07/2018 09:30    Medications: I have reviewed the patient's current medications.

## 2018-12-07 NOTE — Progress Notes (Addendum)
Patient Demographics:    Cameron Boyle, is a 83 y.o. male, DOB - 1935-12-26, BXU:[id number]  Admit date - 11/17/2018   Admitting Physician Orson Eva, MD  Outpatient Primary MD for the patient is [name], MD (Inactive)  LOS - 3   Chief Complaint  Patient presents with  . Abnormal Lab        Subjective:    Cameron Boyle today has no fevers, no emesis,  No chest pain, cough and shortness of breath improving, no further fevers, no bloody stools  Assessment  & Plan :    Active Problems:   Acute blood loss anemia   Acute on chronic diastolic CHF (congestive heart failure) (HCC)   Acute renal failure superimposed on stage 4 chronic kidney disease (HCC)   Transfusion-dependent anemia   Cough   Symptomatic anemia   Aspiration pneumonia due to regurgitated food [facility])  Brief Summary:- 83 y.o. male with medical history of diastolic dysfunction, right MCA stroke, hypertension, hyperlipidemia, CKD stage IV presenting with 1 to 83-month history of dyspnea on exertion.   Admitted on 11/28/2018 with tachycardia, volume overload symptoms as well as hemoglobin dropping down to 6.7, improved posttransfusion, developed pneumonia while undergoing bowel prep, query aspiration   Plan:- 1)Acute on chronic blood loss Anemia -Hemoglobin 10.4 on 07/22/2018 --GI consult -Iron saturation 5%, ferritin 242 -Transfuse Feraheme x1 -Serum V91 660 -Folic acid 60.0 -Hemoglobin initially up to 7.9 from 6.7 post transfusion of 1 unit, patient was started on IV fluids due to acute kidney injury so now hemoglobin is trending down ( 7.2 from 7.9)....... GI service would like to hold off on possible EGD/colonoscopy given respiratory concerns at this time   2)HFpEF/Acute on Chronic Diastolic CHF ---Improved on IV Lasix--BNP is down to 239 from 494, repeat chest x-ray on 12/07/2018 without pulmonary venous congestion or pulmonary edema  -07/22/2018 echo EF 60-65%, no WMA, grade 1 DD  3)Acute on chronic renal failure--CKD stage IV -Baseline creatinine 2.9-3.1 -Presenting creatinine 3.94, creatinine now up to 4.93 due to diuresis, okay to stop Lasix, give gentle hydration -AKI on CKD stage IV  Vs progression of underlying CKD--avoid nephrotoxic agents...   4)Essential Hypertension Stable -Continue amlodipine and hydralazine  5)Hyperlipidemia -Continue statin  6)History of right MCA stroke -Patient had loop recorder placed 07/22/18 - Plavix restarted , would watch closely to see if further GI bleed   7)Bilateral pneumonia-Not POA------ may be aspiration related during bowel prep /GoLYTELY use- clinically improving, dyspnea and oxygenation improving slowly, WBC down to 9.5 from 14.2, repeat chest x-ray suggestive of bilateral pneumonia, worse on the right, continue IV Rocephin/azithromycin given clinical improvement, consider Unasyn/Augmentin to cover for possible aspiration if clinical status does not improve as quickly as anticipated, mucolytics and bronchodilators as ordered  8)Dysphagia--- patient with longstanding history of swallowing difficulties... Moderate aspiration risk, discussed with speech pathologist, see recommendations from speech pathologist dated 12/07/2018--Dysphagia 2 (Fine chop);Nectar-thick liquid   Disposition/Need for in-Hospital Stay- patient unable to be discharged at this time due to patient with CHF flareup/, now with AKI and aspiration pneumonia requiring IV antibiotics  Code Status : full   Family Communication:   na   Disposition Plan  : home with [facility], he will need endoluminal evaluation in 4  to 6 weeks with GI service  Consults  :  Gi  DVT Prophylaxis  :  SCDs   Lab Results  Component Value Date   PLT 151 12/07/2018    Inpatient Medications  Scheduled Meds: . albuterol  2.5 mg Nebulization TID  . amLODipine  10 mg Oral Daily  . atorvastatin  20 mg Oral q1800  . clopidogrel   75 mg Oral Daily  . guaiFENesin  600 mg Oral BID  . hydrALAZINE  25 mg Oral Q8H  . pantoprazole  40 mg Oral BID AC  . sodium chloride flush  3 mL Intravenous Q12H   Continuous Infusions: . sodium chloride    . sodium chloride Stopped (11/27/2018 2229)  . azithromycin 500 mg (12/07/18 1001)  . cefTRIAXone (ROCEPHIN)  IV 1 g (12/07/18 0746)  . dextrose 5 % and 0.45% NaCl 75 mL/hr at 12/07/18 1244   PRN Meds:.sodium chloride, sodium chloride, acetaminophen, guaiFENesin-dextromethorphan, hydrALAZINE, ondansetron (ZOFRAN) IV, Resource ThickenUp Clear, sodium chloride flush    Anti-infectives (From admission, onward)   Start     Dose/Rate Route Frequency Ordered Stop   12/10/2018 0930  azithromycin (ZITHROMAX) 500 mg in sodium chloride 0.9 % 250 mL IVPB     500 mg 250 mL/hr over 60 Minutes Intravenous Every 24 hours 12/02/2018 0752     12/14/2018 0900  cefTRIAXone (ROCEPHIN) 1 g in sodium chloride 0.9 % 100 mL IVPB     1 g 200 mL/hr over 30 Minutes Intravenous Every 24 hours 12/09/2018 0752          Objective:   Vitals:   12/07/18 0559 12/07/18 0856 12/07/18 1302 12/07/18 1402  BP: 133/68  (!) 131/57   Pulse: 82  83   Resp:   19   Temp: 98.4 F (36.9 C)  98.9 F (37.2 C)   TempSrc: Oral  Oral   SpO2: 96% 91% 95% 93%  Weight:      Height:        Wt Readings from Last 3 Encounters:  12/07/18 59.3 kg  08/20/18 63.5 kg  07/22/18 55.8 kg     Intake/Output Summary (Last 24 hours) at 12/07/2018 1412 Last data filed at 12/07/2018 0800 Gross per 24 hour  Intake 3044.18 ml  Output 2000 ml  Net 1044.18 ml     Physical Exam Patient is examined daily including today on 12/07/2018, exams remain the same as of yesterday except that has changed   Gen:- Awake Alert,  In no apparent distress  HEENT:- [place].AT, No sclera icterus Neck-Supple Neck,No JVD,.  Lungs-diminished breath sounds in bases, bibasilar rales, no wheezing  CV- S1, S2 normal, regular  Abd-  +ve B.Sounds, Abd Soft, No  tenderness,    Extremity/Skin:- + edema, pedal pulses present  Psych-affect is appropriate, oriented x3 Neuro-no new focal deficits, no tremors   Data Review:   Micro Results No results found for this or any previous visit (from the past 240 hour(s)).  Radiology Reports Dg Chest 2 View  Result Date: 12/10/2018 CLINICAL DATA:  Cough EXAM: CHEST - 2 VIEW COMPARISON:  09/02/2013 FINDINGS: There are patchy areas of interstitial and alveolar airspace disease in the upper and lower lobes bilaterally concerning for interstitial pneumonitis secondary to an infectious or inflammatory etiology versus mild pulmonary edema. There is no pleural effusion or pneumothorax. The heart and mediastinal contours are unremarkable. The osseous structures are unremarkable. IMPRESSION: Patchy areas of interstitial and alveolar airspace disease in the upper and lower lobes bilaterally concerning for  interstitial pneumonitis secondary to an infectious or inflammatory etiology versus mild pulmonary edema. Electronically Signed   By: Kathreen Devoid   On: 11/25/2018 11:46   Dg Chest Port 1 View  Result Date: 12/07/2018 CLINICAL DATA:  Dyspnea. EXAM: PORTABLE CHEST 1 VIEW COMPARISON:  12/05/2018 FINDINGS: Exam demonstrates persistent bilateral multifocal airspace opacification with possible slight worsening over the right mid to lower lung. No effusion. Cardiomediastinal silhouette and remainder of the exam is unchanged. IMPRESSION: Bilateral multifocal airspace process with slight worsening over the right mid to lower lung likely multifocal pneumonia. Electronically Signed   By: Marin Olp M.D.   On: 12/07/2018 09:30   Dg Chest Port 1 View  Result Date: 12/05/2018 CLINICAL DATA:  Shortness of breath, cough EXAM: PORTABLE CHEST 1 VIEW COMPARISON:  12/08/2018 FINDINGS: Patchy airspace disease in both lungs have worsened since prior study concerning for multifocal pneumonia. Heart is borderline in size. No visible significant  effusions or acute bony abnormality. Loop recorder device again noted, unchanged. IMPRESSION: Patchy bilateral airspace opacities worsening since prior study concerning for pneumonia. Electronically Signed   By: Rolm Baptise M.D.   On: 12/05/2018 20:29     CBC Recent Labs  Lab 11/25/2018 1110 12/05/18 0455 12/05/2018 0830 12/07/18 0510  WBC 8.3 11.5* 14.2* 9.5  HGB 6.7* 7.9* 7.5* 7.2*  HCT 21.6* 24.7* 24.1* 23.1*  PLT 190 174 187 151  MCV 93.5 90.5 93.1 93.1  MCH 29.0 28.9 29.0 29.0  MCHC 31.0 32.0 31.1 31.2  RDW 14.4 14.7 14.7 14.6  LYMPHSABS 0.9  --   --   --   MONOABS 0.7  --   --   --   EOSABS 0.5  --   --   --   BASOSABS 0.1  --   --   --     Chemistries  Recent Labs  Lab 12/17/2018 1110 12/05/18 0455 12/12/2018 0426 12/07/18 0510  NA 139 141 142 138  K 4.9 4.7 4.6 4.0  CL 112* 114* 114* 111  CO2 17* 17* 17* 17*  GLUCOSE 95 89 98 138*  BUN 90* 89* 90* 87*  CREATININE 3.94* 4.07* 4.66* 4.93*  CALCIUM 8.8* 8.7* 8.3* 7.9*  AST 24  --   --   --   ALT 20  --   --   --   ALKPHOS 70  --   --   --   BILITOT 0.4  --   --   --    ------------------------------------------------------------------------------------------------------------------ No results for input(s): CHOL, HDL, LDLCALC, TRIG, CHOLHDL, LDLDIRECT in the last 72 hours.  Lab Results  Component Value Date   HGBA1C 5.1 07/20/2018   ------------------------------------------------------------------------------------------------------------------ No results for input(s): TSH, T4TOTAL, T3FREE, THYROIDAB in the last 72 hours.  Invalid input(s): FREET3 ------------------------------------------------------------------------------------------------------------------ No results for input(s): VITAMINB12, FOLATE, FERRITIN, TIBC, IRON, RETICCTPCT in the last 72 hours.  Coagulation profile No results for input(s): INR, PROTIME in the last 168 hours.  No results for input(s): DDIMER in the last 72 hours.  Cardiac  Enzymes No results for input(s): CKMB, TROPONINI, MYOGLOBIN in the last 168 hours.  Invalid input(s): CK ------------------------------------------------------------------------------------------------------------------    Component Value Date/Time   BNP 239.0 (H) 12/07/2018 6761     Roxan Hockey M.D on 12/07/2018 at 2:12 PM  Go to [email or web address] - for contact info  [facility] - Office  [phone]

## 2018-12-08 DIAGNOSIS — D62 Acute posthemorrhagic anemia: Secondary | ICD-10-CM

## 2018-12-08 LAB — BASIC METABOLIC PANEL
Anion gap: 11 (ref 5–15)
BUN: 85 mg/dL — ABNORMAL HIGH (ref 8–23)
CHLORIDE: 111 mmol/L (ref 98–111)
CO2: 17 mmol/L — ABNORMAL LOW (ref 22–32)
Calcium: 8 mg/dL — ABNORMAL LOW (ref 8.9–10.3)
Creatinine, Ser: 5.14 mg/dL — ABNORMAL HIGH (ref 0.61–1.24)
GFR calc Af Amer: 11 mL/min — ABNORMAL LOW (ref >60)
GFR calc non Af Amer: 10 mL/min — ABNORMAL LOW (ref >60)
Glucose, Bld: 111 mg/dL — ABNORMAL HIGH (ref 70–99)
Potassium: 4.4 mmol/L (ref 3.5–5.1)
Sodium: 139 mmol/L (ref 135–145)

## 2018-12-08 NOTE — Progress Notes (Signed)
Patient Demographics:    Cameron Boyle, is a 83 y.o. male, DOB - 04-Mar-1936, EXB:[id number]  Admit date - 12/03/2018   Admitting Physician Orson Eva, MD  Outpatient Primary MD for the patient is [name], MD (Inactive)  LOS - 4   Chief Complaint  Patient presents with  . Abnormal Lab        Subjective:    Cameron Boyle today has no further fevers, no emesis,  No chest pain,  Aspiration concerns persist.... no dark or bloody stool  Assessment  & Plan :    Active Problems:   Acute blood loss anemia   Acute on chronic diastolic CHF (congestive heart failure) (HCC)   Acute renal failure superimposed on stage 4 chronic kidney disease (HCC)   Transfusion-dependent anemia   Cough   Symptomatic anemia   Aspiration pneumonia due to regurgitated food [facility])  Brief Summary:- 83 y.o. male with medical history of diastolic dysfunction, right MCA stroke, hypertension, hyperlipidemia, CKD stage IV presenting with 1 to 22-month history of dyspnea on exertion.   Admitted on 11/28/2018 with tachycardia, volume overload symptoms as well as hemoglobin dropping down to 6.7, improved posttransfusion, developed pneumonia while undergoing bowel prep, query aspiration   Plan:- 1)Acute on chronic blood loss Anemia---secondary to acute GI bleed/acute blood loss anemia -Hemoglobin 10.4 on 07/22/2018 --GI consult -Iron saturation 5%, ferritin 242 -Transfuse Feraheme x1 -Serum N02 725 -Folic acid 36.6 -Hemoglobin initially up to 7.9 from 6.7 post transfusion of 1 unit, patient was started on IV fluids due to acute kidney injury so now hemoglobin is trending down ( 7.2 from 7.9)....... GI service would like to hold off on possible EGD/colonoscopy given respiratory concerns at this time   2)HFpEF/Acute on Chronic Diastolic CHF ---Improved on IV Lasix--BNP is down to 239 from 494, repeat chest x-ray on 12/07/2018 without  pulmonary venous congestion or pulmonary edema -07/22/2018 echo EF 60-65%, no WMA, grade 1 DD  3)Acute on chronic renal failure--CKD stage IV -Baseline creatinine 2.9-3.1 -Presenting creatinine 3.94, creatinine now up to 5.14  due to diuresis, stopped Lasix, c/n gentle hydration -AKI on CKD stage IV  Vs progression of underlying CKD--avoid nephrotoxic agents...  Nephrology consult in a.m. if renal function does not start to improve with gentle hydration  4)Essential Hypertension Stable -Continue amlodipine and hydralazine  5)Hyperlipidemia -Continue statin  6)History of right MCA stroke -Patient had loop recorder placed 07/22/18 - Plavix restarted , would watch closely to see if further GI bleed   7)Bilateral pneumonia-Not POA------ may be aspiration related during bowel prep /GoLYTELY use- clinically improving, dyspnea and oxygenation improving slowly, WBC down to 9.5 from 14.2, repeat chest x-ray suggestive of bilateral pneumonia, worse on the right, continue IV Rocephin/azithromycin given clinical improvement, consider Unasyn/Augmentin to cover for possible aspiration if clinical status does not improve as quickly as anticipated, mucolytics and bronchodilators as ordered  8)Dysphagia--- patient with longstanding history of swallowing difficulties... Moderate aspiration risk, discussed with speech pathologist, see recommendations from speech pathologist dated 12/07/2018--Dysphagia 2 (Fine chop);Nectar-thick liquid   Disposition/Need for in-Hospital Stay- patient unable to be discharged at this time due to patient with CHF flareup/, now with AKI and aspiration pneumonia requiring IV fluids and IV antibiotics  Code Status : full  Family Communication:   na   Disposition Plan  : home with [facility], he will need endoluminal evaluation in 4 to 6 weeks with GI service  Consults  :  Gi  DVT Prophylaxis  :  SCDs   Lab Results  Component Value Date   PLT 151 12/07/2018    Inpatient  Medications  Scheduled Meds: . albuterol  2.5 mg Nebulization TID  . amLODipine  10 mg Oral Daily  . atorvastatin  20 mg Oral q1800  . clopidogrel  75 mg Oral Daily  . guaiFENesin  600 mg Oral BID  . hydrALAZINE  25 mg Oral Q8H  . pantoprazole  40 mg Oral BID AC  . sodium chloride flush  3 mL Intravenous Q12H   Continuous Infusions: . sodium chloride    . sodium chloride Stopped (11/28/2018 2229)  . azithromycin 500 mg (12/08/18 1041)  . cefTRIAXone (ROCEPHIN)  IV 1 g (12/08/18 0852)  . dextrose 5 % and 0.45% NaCl 75 mL/hr at 12/07/18 1244   PRN Meds:.sodium chloride, sodium chloride, acetaminophen, guaiFENesin-dextromethorphan, hydrALAZINE, HYDROcodone-acetaminophen, ondansetron (ZOFRAN) IV, Resource ThickenUp Clear, sodium chloride flush    Anti-infectives (From admission, onward)   Start     Dose/Rate Route Frequency Ordered Stop   12/16/2018 0930  azithromycin (ZITHROMAX) 500 mg in sodium chloride 0.9 % 250 mL IVPB     500 mg 250 mL/hr over 60 Minutes Intravenous Every 24 hours 12/03/2018 0752     12/02/2018 0900  cefTRIAXone (ROCEPHIN) 1 g in sodium chloride 0.9 % 100 mL IVPB     1 g 200 mL/hr over 30 Minutes Intravenous Every 24 hours 12/11/2018 0752          Objective:   Vitals:   12/08/18 0715 12/08/18 0852 12/08/18 1325 12/08/18 1408  BP:  (!) 124/57  139/61  Pulse:    82  Resp:      Temp:    98.9 F (37.2 C)  TempSrc:    Oral  SpO2: (!) 88%  90% 90%  Weight:      Height:        Wt Readings from Last 3 Encounters:  12/08/18 60.2 kg  08/20/18 63.5 kg  07/22/18 55.8 kg     Intake/Output Summary (Last 24 hours) at 12/08/2018 1623 Last data filed at 12/08/2018 1300 Gross per 24 hour  Intake 360 ml  Output -  Net 360 ml     Physical Exam Patient is examined daily including today on 12/08/2018, exams remain the same as of yesterday except that has changed   Gen:- Awake Alert,  In no apparent distress  HEENT:- [place].AT, No sclera icterus Neck-Supple Neck,No  JVD,.  Lungs- diminished breath sounds in bases, bibasilar rales, no wheezing  CV- S1, S2 normal, regular  Abd-  +ve B.Sounds, Abd Soft, No tenderness,    Extremity/Skin:- + edema, pedal pulses present  Psych-affect is appropriate, oriented x3 Neuro-no new focal deficits, no tremors   Data Review:   Micro Results No results found for this or any previous visit (from the past 240 hour(s)).  Radiology Reports Dg Chest 2 View  Result Date: 12/02/2018 CLINICAL DATA:  Cough EXAM: CHEST - 2 VIEW COMPARISON:  09/02/2013 FINDINGS: There are patchy areas of interstitial and alveolar airspace disease in the upper and lower lobes bilaterally concerning for interstitial pneumonitis secondary to an infectious or inflammatory etiology versus mild pulmonary edema. There is no pleural effusion or pneumothorax. The heart and mediastinal contours are unremarkable. The osseous structures  are unremarkable. IMPRESSION: Patchy areas of interstitial and alveolar airspace disease in the upper and lower lobes bilaterally concerning for interstitial pneumonitis secondary to an infectious or inflammatory etiology versus mild pulmonary edema. Electronically Signed   By: Kathreen Devoid   On: 12/05/2018 11:46   Dg Chest Port 1 View  Result Date: 12/07/2018 CLINICAL DATA:  Dyspnea. EXAM: PORTABLE CHEST 1 VIEW COMPARISON:  12/05/2018 FINDINGS: Exam demonstrates persistent bilateral multifocal airspace opacification with possible slight worsening over the right mid to lower lung. No effusion. Cardiomediastinal silhouette and remainder of the exam is unchanged. IMPRESSION: Bilateral multifocal airspace process with slight worsening over the right mid to lower lung likely multifocal pneumonia. Electronically Signed   By: Marin Olp M.D.   On: 12/07/2018 09:30   Dg Chest Port 1 View  Result Date: 12/05/2018 CLINICAL DATA:  Shortness of breath, cough EXAM: PORTABLE CHEST 1 VIEW COMPARISON:  12/10/2018 FINDINGS: Patchy airspace  disease in both lungs have worsened since prior study concerning for multifocal pneumonia. Heart is borderline in size. No visible significant effusions or acute bony abnormality. Loop recorder device again noted, unchanged. IMPRESSION: Patchy bilateral airspace opacities worsening since prior study concerning for pneumonia. Electronically Signed   By: Rolm Baptise M.D.   On: 12/05/2018 20:29     CBC Recent Labs  Lab 12/16/2018 1110 12/05/18 0455 11/20/2018 0830 12/07/18 0510  WBC 8.3 11.5* 14.2* 9.5  HGB 6.7* 7.9* 7.5* 7.2*  HCT 21.6* 24.7* 24.1* 23.1*  PLT 190 174 187 151  MCV 93.5 90.5 93.1 93.1  MCH 29.0 28.9 29.0 29.0  MCHC 31.0 32.0 31.1 31.2  RDW 14.4 14.7 14.7 14.6  LYMPHSABS 0.9  --   --   --   MONOABS 0.7  --   --   --   EOSABS 0.5  --   --   --   BASOSABS 0.1  --   --   --     Chemistries  Recent Labs  Lab 11/17/2018 1110 12/05/18 0455 12/06/18 0426 12/07/18 0510 12/08/18 0641  NA 139 141 142 138 139  K 4.9 4.7 4.6 4.0 4.4  CL 112* 114* 114* 111 111  CO2 17* 17* 17* 17* 17*  GLUCOSE 95 89 98 138* 111*  BUN 90* 89* 90* 87* 85*  CREATININE 3.94* 4.07* 4.66* 4.93* 5.14*  CALCIUM 8.8* 8.7* 8.3* 7.9* 8.0*  AST 24  --   --   --   --   ALT 20  --   --   --   --   ALKPHOS 70  --   --   --   --   BILITOT 0.4  --   --   --   --    ------------------------------------------------------------------------------------------------------------------ No results for input(s): CHOL, HDL, LDLCALC, TRIG, CHOLHDL, LDLDIRECT in the last 72 hours.  Lab Results  Component Value Date   HGBA1C 5.1 07/20/2018   ------------------------------------------------------------------------------------------------------------------ No results for input(s): TSH, T4TOTAL, T3FREE, THYROIDAB in the last 72 hours.  Invalid input(s): FREET3 ------------------------------------------------------------------------------------------------------------------ No results for input(s): VITAMINB12,  FOLATE, FERRITIN, TIBC, IRON, RETICCTPCT in the last 72 hours.  Coagulation profile No results for input(s): INR, PROTIME in the last 168 hours.  No results for input(s): DDIMER in the last 72 hours.  Cardiac Enzymes No results for input(s): CKMB, TROPONINI, MYOGLOBIN in the last 168 hours.  Invalid input(s): CK ------------------------------------------------------------------------------------------------------------------    Component Value Date/Time   BNP 239.0 (H) 12/07/2018 0321   Roxan Hockey M.D on 12/08/2018 at  4:23 PM  Go to [email or web address] - for contact info  [facility] - Office  [phone]

## 2018-12-08 NOTE — Progress Notes (Signed)
   Assessment/Plan: Admitted with weakness/fatigue/DOE and workup revealed anemia(Hb 6.7). CXR: SHOWED INTERSTITIAL PNEUMONITIS BUT PT WAS IN NO RESPIRATORY DISTRESS. PREPPED FOR TCS AND ASPIRATED BOWEL PREP. FOLLOW UP CXR SHOWED WORSENING INFILTRATES. PLACED ON ABX. SPIKED T 102.72F 24 HRS LATER AND NW AFEBRILE AND DIET HAS BEEN CHANGED BY SPEECH TO DYSPHAGIA 2/NTL. CLINICALLY IMPROVED BUT KIDNEY FUNCTION/BUN CONTINUES TO INCREASE. NO BRBPR OR MELENA.  PLAN: 1. SUPPORTIVE CARE. 2. TRANSFUSE PRN. 3. BID PPI 4. CHECK CBC MON THUR. 5. CONSIDER NEPHROLOGY CONSULT.  GREATER THAN 50% WAS SPENT IN COUNSELING & COORDINATION OF CARE WITH THE PATIENT AND FAMILY: DISCUSSED DIFFERENTIAL DIAGNOSIS, PROCEDURE, BENEFITS, RISKS, AND MANAGEMENT OF ANEMIA/OBSCURE GI BLEED/HOSPITAL COURSE. TOTAL ENCOUNTER TIME: 25 MINS.      Subjective: Since I last evaluated the patient HE HAS No questions or concerns. TOLERATING DYSPHAGIA 2/NTL DIET. NO BRBPR OR MELENA. FAMILY ASKING QUESTION ABOUT LUNGS, KIDNEYS, AND OXYGEN. WANTED TO KNOW HOW LONG HE MAY BE IN THE  HOSPITAL.   Objective: Vital signs in last 24 hours: Vitals:   12/08/18 0715 12/08/18 0852  BP:  (!) 124/57  Pulse:    Resp:    Temp:    SpO2: (!) 88%    General appearance: alert, cooperative and no distress, ABLE TO COMPLETE FULL SENTENCES Resp: COARSE BREATH SOUNDS  bilaterally IN ANTERIOR LUNG Cameron Boyle, NO WHEEZES Cardio: regular rate and rhythm GI: soft, non-tender; bowel sounds normal;  NEURO: NO  NEW FOCAL DEFICITS  Lab Results:  Cr 5.14 K 4.4    Studies/Results: No results found.  Medications: I have reviewed the patient's current medications.

## 2018-12-09 ENCOUNTER — Inpatient Hospital Stay (HOSPITAL_COMMUNITY): Payer: Medicare Other

## 2018-12-09 LAB — LIPASE, BLOOD: Lipase: 32 U/L (ref 11–51)

## 2018-12-09 LAB — HEPATIC FUNCTION PANEL
ALK PHOS: 74 U/L (ref 38–126)
ALT: 27 U/L (ref 0–44)
AST: 47 U/L — ABNORMAL HIGH (ref 15–41)
Albumin: 2.5 g/dL — ABNORMAL LOW (ref 3.5–5.0)
Bilirubin, Direct: 0.1 mg/dL (ref 0.0–0.2)
Indirect Bilirubin: 0.4 mg/dL (ref 0.3–0.9)
Total Bilirubin: 0.5 mg/dL (ref 0.3–1.2)
Total Protein: 6.1 g/dL — ABNORMAL LOW (ref 6.5–8.1)

## 2018-12-09 LAB — CBC
HCT: 23.4 % — ABNORMAL LOW (ref 39.0–52.0)
Hemoglobin: 7 g/dL — ABNORMAL LOW (ref 13.0–17.0)
MCH: 28.1 pg (ref 26.0–34.0)
MCHC: 29.9 g/dL — ABNORMAL LOW (ref 30.0–36.0)
MCV: 94 fL (ref 80.0–100.0)
PLATELETS: 167 10*3/uL (ref 150–400)
RBC: 2.49 MIL/uL — ABNORMAL LOW (ref 4.22–5.81)
RDW: 14.3 % (ref 11.5–15.5)
WBC: 9.8 10*3/uL (ref 4.0–10.5)
nRBC: 0 % (ref 0.0–0.2)

## 2018-12-09 LAB — RENAL FUNCTION PANEL
Albumin: 2.5 g/dL — ABNORMAL LOW (ref 3.5–5.0)
Anion gap: 10 (ref 5–15)
BUN: 83 mg/dL — AB (ref 8–23)
CALCIUM: 8.2 mg/dL — AB (ref 8.9–10.3)
CO2: 17 mmol/L — ABNORMAL LOW (ref 22–32)
CREATININE: 5.15 mg/dL — AB (ref 0.61–1.24)
Chloride: 114 mmol/L — ABNORMAL HIGH (ref 98–111)
GFR calc Af Amer: 11 mL/min — ABNORMAL LOW (ref >60)
GFR calc non Af Amer: 10 mL/min — ABNORMAL LOW (ref >60)
Glucose, Bld: 104 mg/dL — ABNORMAL HIGH (ref 70–99)
Phosphorus: 7.3 mg/dL — ABNORMAL HIGH (ref 2.5–4.6)
Potassium: 4.5 mmol/L (ref 3.5–5.1)
Sodium: 141 mmol/L (ref 135–145)

## 2018-12-09 LAB — TYPE AND SCREEN
ABO/RH(D): B POS
Antibody Screen: NEGATIVE

## 2018-12-09 MED ORDER — SODIUM CHLORIDE 0.9 % IV SOLN
3.0000 g | INTRAVENOUS | Status: DC
  Administered 2018-12-09 – 2018-12-11 (×3): 3 g via INTRAVENOUS
  Filled 2018-12-09 (×4): qty 3

## 2018-12-09 MED ORDER — METHYLPREDNISOLONE SODIUM SUCC 40 MG IJ SOLR
40.0000 mg | Freq: Two times a day (BID) | INTRAMUSCULAR | Status: AC
  Administered 2018-12-09 – 2018-12-10 (×3): 40 mg via INTRAVENOUS
  Filled 2018-12-09 (×3): qty 1

## 2018-12-09 MED ORDER — ASPIRIN 81 MG PO CHEW
81.0000 mg | CHEWABLE_TABLET | Freq: Every day | ORAL | Status: DC
  Administered 2018-12-10 – 2018-12-14 (×5): 81 mg via ORAL
  Filled 2018-12-09 (×5): qty 1

## 2018-12-09 NOTE — Progress Notes (Signed)
Modified Barium Swallow Progress Note  Patient Details  Name: Cameron Boyle MRN: [id number] Date of Birth: [birth year]  Today's Date: 12/09/2018  Modified Barium Swallow completed.  Full report located under Chart Review in the Imaging Section.  Brief recommendations include the following:  Clinical Impression  Pt presents with worsening pharyngoesophageal phase dysphagia from 09/26/2018 characterized by reduced tongue base retraction, reduced epiglottic deflection, reduced pharyngeal pressure, and reduced UES relaxation resulting in moderate vallecular residue and mod/severe pharyngeal residue across consistencies presented, however worse with thicker textures/consistencies. Although Pt only aspirated trace amounts (during and post swallow from residuals), he is unlikely to meet nutritional needs given severity of and inefficiency of pharyngoesophageal swallow. Several postures were trialed and head turn to the Left found to be inconsistently effective (also to the RIGHT) resulting in occasional relaxation of UES and passage of bolus. This was similar to his MBSS in January 2020, however with increased severity at this time. Pt is also deconditioned, which further negatively impacts his swallow efficiency and safety. Pt was seen for dysphagia therapy in February to address dysphagia and reported tolerating some regular textures and thin liquids at home, however continued to present with wet vocal quality. Recommend alternative means of nutrition until he can be seen by ENT who could determine if Pt would be a candidate for UES dilation or Botox and try dysphagia therapy again following. SLP discussed with Dr. Roxan Hockey who will discuss with Pt, surgery, and ENT. Consider NPO with alternative means of nutrition vs NTL only now; ice chips PRN after oral care, and continue ORAL care. SLP will follow per goals of care.     Swallow Evaluation Recommendations   Recommended Consults: Consider ENT  evaluation(consider dilation/Botox to UES if indicated)   SLP Diet Recommendations: Alternative means - long-term;Ice chips PRN after oral care       Medication Administration: Via alternative means               Oral Care Recommendations: Staff/trained caregiver to provide oral care;Oral care QID      Thank you,  [name] Churn, [place] 12/09/2018,4:17 PM

## 2018-12-09 NOTE — Progress Notes (Signed)
Physical Therapy Treatment Patient Details Name: Cameron Boyle MRN: [id number] DOB: [birth year] Today's Date: 12/09/2018    History of Present Illness Cameron Boyle is a 83 y.o. male with medical history of diastolic dysfunction, right MCA stroke, hypertension, hyperlipidemia, CKD stage IV presenting with 1 to 4-month history of dyspnea on exertion.  Patient had routine blood work obtained by his PCP on 11/28/2018.  His hemoglobin was 6.3.  However further history reveals that the patient has had increasing generalized weakness, fatigue, and dyspnea on exertion for nearly 2 months.  He also complains of increasing lower extremity edema.  He denies any frank PND orthopnea.  He denies any chest pain, fevers, chills, coughing, hemoptysis.  There is been no hematochezia, melena, hematemesis, hematuria.    PT Comments    Pt received in bed and was agreeable to PT treatment. Pt c/o R shoulder pain entire session. Began with bed level BLE strengthening activities this date; min cues for form and only min pain in R posterior knee this session during therex. He stated that he had not been out of bed since this PT last got him up (4 days ago). Assessed his shoulder once EOB and noted normal end feel; his pain is likely from immobility over the last few days and not using his R arm (PT spoke to RN regarding his c/o R shoulder pain and RN stating that she would put in an OT evaluation order). Pt with significant decline in function compared to his evaluation 4 days ago as he required mod-max A for STS and SPT with RW this date and pt very unsteady throughout all functional mobility.  Pt is not safe to return home in this condition, therefore, PT is updating d/c recommendation to SNF to address his decline in strength, balance, endurance, and overall functional mobility in order to maximize return to PLOF.     Follow Up Recommendations  SNF     Equipment Recommendations  None recommended by PT    Recommendations  for Other Services       Precautions / Restrictions Precautions Precautions: Fall Precaution Comments: significant weakness with PT this date, requiring great deal more assistance than at evaluation Restrictions Weight Bearing Restrictions: No    Mobility  Bed Mobility Overal bed mobility: Modified Independent             General bed mobility comments: increased time, difficulty due to c/o R shoulder pain  Transfers Overall transfer level: Needs assistance Equipment used: Rolling walker (2 wheeled) Transfers: Sit to/from [facility] Sit to Stand: Mod assist;Max assist Stand pivot transfers: Mod assist;Max assist       General transfer comment: mod-max A for STS and stand pivot transfer bed > chair; pt very unsteady during SPT   Ambulation/Gait Ambulation/Gait assistance: Mod assist;Max assist Gait Distance (Feet): 2 Feet Assistive device: Rolling walker (2 wheeled) Gait Pattern/deviations: Step-to pattern;Decreased stride length     General Gait Details: 32ft in room during SPT with RW; significantly limited compared to evaluation last Thursday on 12/05/18   Stairs             Wheelchair Mobility    Modified Rankin (Stroke Patients Only)       Balance Overall balance assessment: Needs assistance Sitting-balance support: Feet supported Sitting balance-Leahy Scale: Good     Standing balance support: During functional activity;Bilateral upper extremity supported Standing balance-Leahy Scale: Poor Standing balance comment: poor standing balance this date due to weakness and unsteadiness  Cognition Arousal/Alertness: Awake/alert Behavior During Therapy: WFL for tasks assessed/performed Overall Cognitive Status: Within Functional Limits for tasks assessed                                        Exercises Total Joint Exercises Ankle Circles/Pumps: Strengthening;Both;10  reps;Supine Heel Slides: Strengthening;Both;10 reps;Supine;Limitations Heel Slides Limitations: min reports of pain in posterior R knee with this, but overall improved from last treatment session Hip ABduction/ADduction: Strengthening;Both;10 reps;Supine    General Comments        Pertinent Vitals/Pain Pain Assessment: Faces Faces Pain Scale: Hurts little more Pain Location: R shouler Pain Descriptors / Indicators: Aching Pain Intervention(s): Limited activity within patient's tolerance;Monitored during session;Repositioned    Home Living                      Prior Function            PT Goals (current goals can now be found in the care plan section) Acute Rehab PT Goals Patient Stated Goal: to go home PT Goal Formulation: With patient Time For Goal Achievement: 12/16/18 Potential to Achieve Goals: Fair    Frequency    Min 3X/week      PT Plan      Co-evaluation              AM-PAC PT "6 Clicks" Mobility   Outcome Measure  Help needed turning from your back to your side while in a flat bed without using bedrails?: None Help needed moving from lying on your back to sitting on the side of a flat bed without using bedrails?: A Little Help needed moving to and from a bed to a chair (including a wheelchair)?: A Lot Help needed standing up from a chair using your arms (e.g., wheelchair or bedside chair)?: A Lot Help needed to walk in hospital room?: A Lot Help needed climbing 3-5 steps with a railing? : Total 6 Click Score: 14    End of Session Equipment Utilized During Treatment: Gait belt Activity Tolerance: Patient limited by fatigue;Patient limited by pain Patient left: with call bell/phone within reach;in chair;with chair alarm set Nurse Communication: Mobility status(updated RN after session) PT Visit Diagnosis: Unsteadiness on feet (R26.81);Muscle weakness (generalized) (M62.81);Difficulty in walking, not elsewhere classified (R26.2)      Time: 3094-0768 PT Time Calculation (min) (ACUTE ONLY): 28 min  Charges:  $Therapeutic Exercise: 8-22 mins $Therapeutic Activity: 8-22 mins                        Geraldine Solar PT, DPT

## 2018-12-09 NOTE — Progress Notes (Addendum)
Patient Demographics:    Cameron Boyle, is a 83 y.o. male, DOB - 1936/03/05, LEX:[id number]  Admit date - 11/22/2018   Admitting Physician Orson Eva, MD  Outpatient Primary MD for the patient is [name], MD (Inactive)  LOS - 5   Chief Complaint  Patient presents with   Abnormal Lab        Subjective:    [name] today has no further fevers, no emesis,  No chest pain,  Aspiration concerns persist.... no dark or bloody stool, failed MBS test ... sound more junky.[name]??? Aspiration.Marland KitchenMarland KitchenComplains of right shoulder pain-----   Assessment  & Plan :    Active Problems:   Acute blood loss anemia   Acute on chronic diastolic CHF (congestive heart failure) (HCC)   Acute renal failure superimposed on stage 4 chronic kidney disease (HCC)   Transfusion-dependent anemia   Cough   Symptomatic anemia   Aspiration pneumonia due to regurgitated food [facility])  Brief Summary:- 83 y.o. male with medical history of diastolic dysfunction, right MCA stroke, hypertension, hyperlipidemia, CKD stage IV presenting with 81 to 52-month history of dyspnea on exertion.   Admitted on 11/28/2018 with tachycardia, volume overload symptoms as well as hemoglobin dropping down to 6.7, improved posttransfusion, developed pneumonia while undergoing bowel prep, query aspiration--- Failed MBS on 12/09/18--please see full report...  Pt would be a candidate for UES dilation or Botox and try dysphagia therapy again following.  In the meantime would likely need PEG tube to maintain nutritional/hydration needs   Plan:- 1)Acute on chronic blood loss Anemia---secondary to acute GI bleed/acute blood loss anemia -Hemoglobin 10.4 on 81/12/2017 --GI consult -Iron saturation 5%, ferritin 242 -Transfuse Feraheme x1 -Serum S49 675 -Folic acid 91.6 -Hemoglobin initially up to 7.9 from 6.7 post transfusion of 1 unit, patient was started on IV fluids due to  acute kidney injury so now hemoglobin is trending down ( 7.0 from 7.9)....... GI service would like to hold off on possible EGD/colonoscopy given respiratory concerns at this time Most likely need another unit of PRBCs if H&H continues to drift down, some of the drop in H&H is due to hemodilution from IV fluids   2)HFpEF/Acute on Chronic Diastolic CHF ---Improved on IV Lasix--BNP is down to 239 from 494, repeat chest x-ray on 12/07/2018 without pulmonary venous congestion or pulmonary edema -07/22/2018 echo EF 60-65%, no WMA, grade 1 DD  3)Acute on chronic renal failure--CKD stage IV -Baseline creatinine 2.9-3.1 -Presenting creatinine 3.94, creatinine now up to 5.14  due to diuresis and poor oral intake in the setting of dysphasia and aspiration, stopped Lasix, c/n gentle hydration -AKI on CKD stage IV  Vs progression of underlying CKD--avoid nephrotoxic agents...  D/w with Dr Joelyn Oms (Nephrology)  On 12/09/18...[name] Creatinine appears to have plateaued, will continue gentle hydration at this time   4)Essential Hypertension Stable -Continue amlodipine and hydralazine  5)Hyperlipidemia -Continue statin  6)History of Right MCA Stroke -Patient had loop recorder placed 07/22/18 -At the request of general surgeon Dr. Arnoldo Morale will hold Plavix to allow for PEG tube placement give aspirin 81 mg for now.... c/n to watch closely to see if further GI bleed   7)Bilateral Pneumonia-Not POA------ may be aspiration related during bowel prep /GoLYTELY use- patient more congested today,  coughing more, requiring more oxygen, suspect is continue to aspirate, repeat chest x-ray previously suggested bilateral pneumonia, worse on the right, stop IV Rocephin/azithromycin,  start Unasyn on 12/09/18 to cover for possible aspiration, c/n mucolytics and bronchodilators as ordered  8)Dysphagia--- patient with longstanding history of swallowing difficulties... Moderate aspiration risk, discussed with speech pathologist,  see recommendations from speech pathologist dated 12/09/2018--patient failed repeat MBS, aspiration risk with all consistencies,  Pt would be a candidate for UES dilation or Botox and try dysphagia therapy again following.  Discussed with Dr. [name], ENT at [facility] [phone] and [phone]) to see if patient is a candidate for UES dilatation, [name] states that due to COVID 19 transfer restrictions they cannot accept routine/elective transfers at this time ... Patient may see ENT as outpatient down the road in the meantime patient will most likely need PEG tube in order to meet his nutritional/hydration needs...... N.p.o. for now... May have medications crushed in applesauce until PEG tube is placed-- Per [name] --possible PEG tube placement on 81/08/2019  9)FEN- N.p.o. for now, place Dobbhoff/feeding tube until PEG tube can be placed on 81/01/2019, continue IV fluids due to dehydration with AKI superimposed on underlying CKD  10)RT rotator cuff injury----- doubt any fracture, steroids as ordered  Disposition/Need for in-Hospital Stay- patient unable to be discharged at this time due to patient with  AKI and aspiration pneumonia requiring IV fluids and IV antibiotics, patient will need PEG tube feeding  Code Status : full   Family Communication:  Wife by phone   Disposition Plan  : home with [facility], he will need endoluminal evaluation in 4 to 6 weeks with GI service., ENT eval for UES dilatation as outpatient  Consults  :  Gi  DVT Prophylaxis  :  SCDs   Lab Results  Component Value Date   PLT 167 12/09/2018    Inpatient Medications  Scheduled Meds:  albuterol  2.5 mg Nebulization TID   amLODipine  10 mg Oral Daily   atorvastatin  20 mg Oral q1800   clopidogrel  75 mg Oral Daily   guaiFENesin  600 mg Oral BID   hydrALAZINE  25 mg Oral Q8H   methylPREDNISolone (SOLU-MEDROL) injection  40 mg Intravenous Q12H   pantoprazole  40 mg Oral BID AC     sodium chloride flush  3 mL Intravenous Q12H   Continuous Infusions:  sodium chloride     sodium chloride Stopped (12/13/2018 2229)   azithromycin 500 mg (12/09/18 0946)   cefTRIAXone (ROCEPHIN)  IV 1 g (12/09/18 0857)   dextrose 5 % and 0.45% NaCl 75 mL/hr at 12/09/18 0843   PRN Meds:.sodium chloride, sodium chloride, acetaminophen, guaiFENesin-dextromethorphan, hydrALAZINE, HYDROcodone-acetaminophen, ondansetron (ZOFRAN) IV, Resource ThickenUp Clear, sodium chloride flush    Anti-infectives (From admission, onward)   Start     Dose/Rate Route Frequency Ordered Stop   11/23/2018 0930  azithromycin (ZITHROMAX) 500 mg in sodium chloride 0.9 % 250 mL IVPB     500 mg 250 mL/hr over 60 Minutes Intravenous Every 24 hours 12/04/2018 0752     12/08/2018 0900  cefTRIAXone (ROCEPHIN) 1 g in sodium chloride 0.9 % 100 mL IVPB     1 g 200 mL/hr over 30 Minutes Intravenous Every 24 hours 11/23/2018 0752          Objective:   Vitals:   12/09/18 0549 12/09/18 0727 12/09/18 0947 12/09/18 1329  BP: 134/63  127/60 137/60  Pulse: 93  96  Resp: 19     Temp: 99.1 F (37.3 C)  99.2 F (37.3 C)   TempSrc: Oral  Oral   SpO2: 94% 95% 93%   Weight: 62.2 kg     Height:        Wt Readings from Last 3 Encounters:  12/09/18 62.2 kg  08/20/18 63.5 kg  07/22/18 55.8 kg     Intake/Output Summary (Last 24 hours) at 12/09/2018 1642 Last data filed at 12/09/2018 1300 Gross per 24 hour  Intake 240 ml  Output 1275 ml  Net -1035 ml     Physical Exam Patient is examined daily including today on 12/09/2018, exams remain the same as of yesterday except that has changed   Gen:- Awake Alert,  In no apparent distress  HEENT:- [place].AT, No sclera icterus Neck-Supple Neck,No JVD,.  Lungs- diminished breath sounds in bases, sounds very congested with coarse rales  CV- S1, S2 normal, regular  Abd-  +ve B.Sounds, Abd Soft, No tenderness,    Extremity/Skin:- + edema, pedal pulses present  Psych-affect is  appropriate, oriented x3 Neuro-no new focal deficits, no tremors MSK--right rotator cuff discomfort and limitations of range of motion  Data Review:   Micro Results No results found for this or any previous visit (from the past 240 hour(s)).  Radiology Reports Dg Chest 2 View  Result Date: 11/30/2018 CLINICAL DATA:  Cough EXAM: CHEST - 2 VIEW COMPARISON:  09/02/2013 FINDINGS: There are patchy areas of interstitial and alveolar airspace disease in the upper and lower lobes bilaterally concerning for interstitial pneumonitis secondary to an infectious or inflammatory etiology versus mild pulmonary edema. There is no pleural effusion or pneumothorax. The heart and mediastinal contours are unremarkable. The osseous structures are unremarkable. IMPRESSION: Patchy areas of interstitial and alveolar airspace disease in the upper and lower lobes bilaterally concerning for interstitial pneumonitis secondary to an infectious or inflammatory etiology versus mild pulmonary edema. Electronically Signed   By: Kathreen Devoid   On: 12/12/2018 11:46   Dg Chest Port 1 View  Result Date: 12/07/2018 CLINICAL DATA:  Dyspnea. EXAM: PORTABLE CHEST 1 VIEW COMPARISON:  12/05/2018 FINDINGS: Exam demonstrates persistent bilateral multifocal airspace opacification with possible slight worsening over the right mid to lower lung. No effusion. Cardiomediastinal silhouette and remainder of the exam is unchanged. IMPRESSION: Bilateral multifocal airspace process with slight worsening over the right mid to lower lung likely multifocal pneumonia. Electronically Signed   By: Marin Olp M.D.   On: 12/07/2018 09:30   Dg Chest Port 1 View  Result Date: 12/05/2018 CLINICAL DATA:  Shortness of breath, cough EXAM: PORTABLE CHEST 1 VIEW COMPARISON:  12/07/2018 FINDINGS: Patchy airspace disease in both lungs have worsened since prior study concerning for multifocal pneumonia. Heart is borderline in size. No visible significant effusions or  acute bony abnormality. Loop recorder device again noted, unchanged. IMPRESSION: Patchy bilateral airspace opacities worsening since prior study concerning for pneumonia. Electronically Signed   By: Rolm Baptise M.D.   On: 12/05/2018 20:29   Dg Swallowing Func-speech Pathology  Result Date: 12/09/2018 Objective Swallowing Evaluation: Type of Study: MBS-Modified Barium Swallow Study  Patient Details Name: RAFEAL SKIBICKI MRN: [id number] Date of Birth: 03-06-1936 Today's Date: 12/09/2018 Time: SLP Start Time (ACUTE ONLY): 1435 -SLP Stop Time (ACUTE ONLY): 1512 SLP Time Calculation (min) (ACUTE ONLY): 37 min Past Medical History: Past Medical History: Diagnosis Date  Arthritis   left knee  Stroke ([place])   07/18/18 Past Surgical History: Past Surgical History: Procedure Laterality Date  APPENDECTOMY    left knee surgery    LOOP RECORDER INSERTION N/A 07/22/2018  Procedure: LOOP RECORDER INSERTION;  Surgeon: Evans Lance, MD;  Location: [place] CV LAB;  Service: Cardiovascular;  Laterality: N/A;  TEE WITHOUT CARDIOVERSION N/A 07/22/2018  Procedure: TRANSESOPHAGEAL ECHOCARDIOGRAM (TEE);  Surgeon: Dorothy Spark, MD;  Location: [facility] ENDOSCOPY;  Service: Cardiovascular;  Laterality: N/A;  TONSILLECTOMY   HPI: 83 year old male presenting for symptomaticnormocyticanemia. Hospitalized back in November with cryptogenic stroke, initially on dual therapy with Plavix and aspirin and subsequently on Plavix only. At time of discharge on November 4 thhis hemoglobin was 10.4. Presented yesterday with a hemoglobin of 6.7. Had noted progressive weakness, lower extremity edema but no overt melena, rectal bleeding. Stool heme-negative here. Received 1 unit of packed red blood cells, 1 dose of Feraheme. Remains on Plavix. Hemoglobin up to 7.9 yesterday and drift to 7.5 this morning.Suspected occult GI bleeding in the setting of Plavix. Planned colonoscopy/EGD today, however overnight the patient began having  significant coughing with GoLytely prep. Chest XRay showed patchy bilateral airspace worsening since prior study concerning for pneumonia. Concerning for aspiration pna and patient unable to complete bowel prep so TCS/EGD was cancelled. BSE requested. Pt is known to this SLP from [place] (09/26/18) and outpatient dysphagia therapy for mod/severe sensorimotor based pharyngeal dysphagia. Pt was discharged from outpatient therapy in February with recommendation to continue HEP indefinitely (Pt with reduced awareness of severity of dysphagia).  Subjective: "Soda pop goes down ok." Special Tests: MBSS Assessment / Plan / Recommendation CHL IP CLINICAL IMPRESSIONS 12/09/2018 Clinical Impression Pt presents with worsening pharyngoesophageal phase dysphagia from 09/26/2018 characterized by reduced tongue base retraction, reduced epiglottic deflection, reduced pharyngeal pressure, and reduced UES relaxation resulting in moderate vallecular residue and mod/severe pharyngeal residue across consistencies presented, however worse with thicker textures/consistencies. Although Pt only aspirated trace amounts (during and post swallow from residuals), he is unlikely to meet nutritional needs given severity of and inefficiency of pharyngoesophageal swallow. Several postures were trialed and head turn to the Left found to be inconsistently effective (also to the RIGHT) resulting in occasional relaxation of UES and passage of bolus. This was similar to his MBSS in January 2020, however with increased severity at this time. Pt is also deconditioned, which further negatively impacts his swallow efficiency and safety. Pt was seen for dysphagia therapy in February to address dysphagia and reported tolerating some regular textures and thin liquids at home, however continued to present with wet vocal quality. Recommend alternative means of nutrition until he can be seen by ENT who could determine if Pt would be a candidate for UES dilation or Botox  and try dysphagia therapy again following. SLP discussed with Dr. Roxan Hockey who will discuss with Pt, surgery, and ENT. Consider NPO with alternative means of nutrition vs NTL only now; ice chips PRN after oral care, and continue ORAL care. SLP will follow per goals of care.  SLP Visit Diagnosis Dysphagia, pharyngoesophageal phase (R13.14) Attention and concentration deficit following -- Frontal lobe and executive function deficit following -- Impact on safety and function Moderate aspiration risk;Risk for inadequate nutrition/hydration   CHL IP TREATMENT RECOMMENDATION 12/09/2018 Treatment Recommendations Therapy as outlined in treatment plan below   Prognosis 12/09/2018 Prognosis for Safe Diet Advancement Guarded Barriers to Reach Goals Severity of deficits Barriers/Prognosis Comment -- CHL IP DIET RECOMMENDATION 12/09/2018 SLP Diet Recommendations Alternative means - long-term;Ice chips PRN after oral care Liquid Administration via -- Medication Administration Via alternative means Compensations --  Postural Changes --   CHL IP OTHER RECOMMENDATIONS 12/09/2018 Recommended Consults Consider ENT evaluation Oral Care Recommendations Staff/trained caregiver to provide oral care;Oral care QID Other Recommendations --   CHL IP FOLLOW UP RECOMMENDATIONS 12/09/2018 Follow up Recommendations Skilled Nursing facility   [facility] IP FREQUENCY AND DURATION 12/09/2018 Speech Therapy Frequency (ACUTE ONLY) min 2x/week Treatment Duration 1 week      CHL IP ORAL PHASE 12/09/2018 Oral Phase WFL Oral - Pudding Teaspoon -- Oral - Pudding Cup -- Oral - Honey Teaspoon -- Oral - Honey Cup -- Oral - Nectar Teaspoon -- Oral - Nectar Cup -- Oral - Nectar Straw -- Oral - Thin Teaspoon -- Oral - Thin Cup -- Oral - Thin Straw -- Oral - Puree -- Oral - Mech Soft -- Oral - Regular -- Oral - Multi-Consistency -- Oral - Pill -- Oral Phase - Comment --  CHL IP PHARYNGEAL PHASE 12/09/2018 Pharyngeal Phase Impaired Pharyngeal- Pudding Teaspoon --  Pharyngeal -- Pharyngeal- Pudding Cup -- Pharyngeal -- Pharyngeal- Honey Teaspoon Delayed swallow initiation-vallecula;Delayed swallow initiation-pyriform sinuses;Reduced pharyngeal peristalsis;Reduced epiglottic inversion;Reduced tongue base retraction;Penetration/Aspiration during swallow;Penetration/Apiration after swallow;Pharyngeal residue - valleculae;Pharyngeal residue - pyriform;Pharyngeal residue - posterior pharnyx Pharyngeal Material enters airway, CONTACTS cords and not ejected out Pharyngeal- Honey Cup -- Pharyngeal -- Pharyngeal- Nectar Teaspoon Delayed swallow initiation-vallecula;Delayed swallow initiation-pyriform sinuses;Reduced pharyngeal peristalsis;Reduced epiglottic inversion;Reduced tongue base retraction;Penetration/Aspiration during swallow;Penetration/Apiration after swallow;Pharyngeal residue - valleculae;Pharyngeal residue - pyriform;Pharyngeal residue - posterior pharnyx Pharyngeal Material enters airway, CONTACTS cords and not ejected out Pharyngeal- Nectar Cup -- Pharyngeal -- Pharyngeal- Nectar Straw -- Pharyngeal -- Pharyngeal- Thin Teaspoon Delayed swallow initiation-vallecula;Delayed swallow initiation-pyriform sinuses;Reduced pharyngeal peristalsis;Reduced epiglottic inversion;Reduced tongue base retraction;Penetration/Aspiration during swallow;Penetration/Apiration after swallow;Pharyngeal residue - valleculae;Pharyngeal residue - pyriform;Pharyngeal residue - posterior pharnyx Pharyngeal Material enters airway, passes BELOW cords without attempt by patient to eject out (silent aspiration);Material enters airway, passes BELOW cords and not ejected out despite cough attempt by patient Pharyngeal- Thin Cup -- Pharyngeal -- Pharyngeal- Thin Straw -- Pharyngeal -- Pharyngeal- Puree Delayed swallow initiation-vallecula;Reduced pharyngeal peristalsis;Reduced epiglottic inversion;Reduced tongue base retraction;Penetration/Apiration after swallow;Pharyngeal residue -  valleculae;Pharyngeal residue - pyriform;Pharyngeal residue - posterior pharnyx Pharyngeal Material enters airway, CONTACTS cords and then ejected out;Material does not enter airway Pharyngeal- Mechanical Soft -- Pharyngeal -- Pharyngeal- Regular -- Pharyngeal -- Pharyngeal- Multi-consistency -- Pharyngeal -- Pharyngeal- Pill -- Pharyngeal -- Pharyngeal Comment --  CHL IP CERVICAL ESOPHAGEAL PHASE 12/09/2018 Cervical Esophageal Phase Impaired Pudding Teaspoon -- Pudding Cup -- Honey Teaspoon -- Honey Cup -- Nectar Teaspoon -- Nectar Cup -- Nectar Straw -- Thin Teaspoon -- Thin Cup -- Thin Straw -- Puree Reduced cricopharyngeal relaxation;Prominent cricopharyngeal segment Mechanical Soft -- Regular -- Multi-consistency -- Pill -- Cervical Esophageal Comment -- Thank you, [name] Churn, CCC-SLP [phone] PORTER,DABNEY 12/09/2018, 4:33 PM                CBC Recent Labs  Lab 12/16/2018 1110 12/05/18 0455 11/21/2018 0830 12/07/18 0510 12/09/18 0452  WBC 8.3 11.5* 14.2* 9.5 9.8  HGB 6.7* 7.9* 7.5* 7.2* 7.0*  HCT 21.6* 24.7* 24.1* 23.1* 23.4*  PLT 190 174 187 151 167  MCV 93.5 90.5 93.1 93.1 94.0  MCH 29.0 28.9 29.0 29.0 28.1  MCHC 31.0 32.0 31.1 31.2 29.9*  RDW 14.4 14.7 14.7 14.6 14.3  LYMPHSABS 0.9  --   --   --   --   MONOABS 0.7  --   --   --   --   EOSABS 0.5  --   --   --   --  BASOSABS 0.1  --   --   --   --     Chemistries  Recent Labs  Lab 11/21/2018 1110 12/05/18 0455 12/05/2018 0426 12/07/18 0510 12/08/18 0641 12/09/18 0452  NA 139 141 142 138 139 141  K 4.9 4.7 4.6 4.0 4.4 4.5  CL 112* 114* 114* 111 111 114*  CO2 17* 17* 17* 17* 17* 17*  GLUCOSE 95 89 98 138* 111* 104*  BUN 90* 89* 90* 87* 85* 83*  CREATININE 3.94* 4.07* 4.66* 4.93* 5.14* 5.15*  CALCIUM 8.8* 8.7* 8.3* 7.9* 8.0* 8.2*  AST 24  --   --   --   --  47*  ALT 20  --   --   --   --  27  ALKPHOS 70  --   --   --   --  74  BILITOT 0.4  --   --   --   --  0.5    ------------------------------------------------------------------------------------------------------------------ No results for input(s): CHOL, HDL, LDLCALC, TRIG, CHOLHDL, LDLDIRECT in the last 72 hours.  Lab Results  Component Value Date   HGBA1C 5.1 07/20/2018   ------------------------------------------------------------------------------------------------------------------ No results for input(s): TSH, T4TOTAL, T3FREE, THYROIDAB in the last 72 hours.  Invalid input(s): FREET3 ------------------------------------------------------------------------------------------------------------------ No results for input(s): VITAMINB12, FOLATE, FERRITIN, TIBC, IRON, RETICCTPCT in the last 72 hours.  Coagulation profile No results for input(s): INR, PROTIME in the last 168 hours.  No results for input(s): DDIMER in the last 72 hours.  Cardiac Enzymes No results for input(s): CKMB, TROPONINI, MYOGLOBIN in the last 168 hours.  Invalid input(s): CK ------------------------------------------------------------------------------------------------------------------    Component Value Date/Time   BNP 239.0 (H) 12/07/2018 8841   Roxan Hockey M.D on 12/09/2018 at 4:42 PM  Go to [email or web address] - for contact info  [facility] - Office  [phone]

## 2018-12-09 NOTE — Progress Notes (Signed)
Carelink Summary Report / Loop Recorder 

## 2018-12-09 NOTE — TOC Progression Note (Signed)
Transition of Care [facility]) - Progression Note    Patient Details  Name: Cameron Boyle MRN: [id number] Date of Birth: 1936/09/18  [facility] of [facility]) CM/SW Contact  Marquel Spoto, Chauncey Reading, RN Phone Number: 12/09/2018, 12:08 PM  Clinical Narrative:   Met with patient at bedside to discuss new PT recommendation from home with home health PT to SNF. Patient is declining SNF.  Patient reports that he can manage at home with his wife and his two sons. Reports one son lives with him and is available 24/7.  They other son lives next door.  He agrees for me to call and talk with his wife. Call to Monika Salk, we discuss his PT eval, how he walked two feet with RW and needed mod/max assist. She agrees with patient that this can be managed at home. She asks about his medical plan, reporting she will call RN for an update.  Discussed with bedside RN. She will also discuss with wife how much assistance patient is requiring. TOC to follow. Left CMS list of SNF facilities with patient.     Expected Discharge Plan: [place] Barriers to Discharge: No Barriers Identified  Expected Discharge Plan and Services Expected Discharge Plan: [place]   Discharge Planning Services: CM Consult Post Acute Care Choice: [place]: PT [facility] Agency: [place] ([facility])

## 2018-12-09 NOTE — Progress Notes (Signed)
    Subjective: Breathing the same. No abdominal pain. No overt GI bleeding. Eating small amounts. Doesn't like the taste of the food. +cough.    Objective: Vital signs in last 24 hours: Temp:  [98.2 F (36.8 C)-99.1 F (37.3 C)] 99.1 F (37.3 C) (03/23 0549) Pulse Rate:  [82-94] 93 (03/23 0549) Resp:  [18-19] 19 (03/23 0549) BP: (124-139)/(57-63) 134/63 (03/23 0549) SpO2:  [90 %-95 %] 95 % (03/23 0727) Weight:  [62.2 kg] 62.2 kg (03/23 0549) Last BM Date: 12/03/18 General:   Alert and oriented, pleasant Head:  Normocephalic and atraumatic. Abdomen:  Bowel sounds present, soft, non-tender, non-distended.  Neurologic:  Alert and  oriented x4 Psych:  Normal mood and affect.  Intake/Output from previous day: 03/22 0701 - 03/23 0700 In: 240 [P.O.:240] Out: 1275 [Urine:1275] Intake/Output this shift: No intake/output data recorded.  Lab Results: Recent Labs    12/07/18 0510 12/09/18 0452  WBC 9.5 9.8  HGB 7.2* 7.0*  HCT 23.1* 23.4*  PLT 151 167   BMET Recent Labs    12/07/18 0510 12/08/18 0641 12/09/18 0452  NA 138 139 141  K 4.0 4.4 4.5  CL 111 111 114*  CO2 17* 17* 17*  GLUCOSE 138* 111* 104*  BUN 87* 85* 83*  CREATININE 4.93* 5.14* 5.15*  CALCIUM 7.9* 8.0* 8.2*   LFT Recent Labs    12/09/18 0452  ALBUMIN 2.5*    Assessment: 83 year old male admitted with weakness, fatigue, DOE, with Hgb 6.7 on admission on Plavix, receiving 1 unit PRBCs and IV Feraheme. Aspirated during bowel prep for colonoscopy. Currently on antibiotics. Tolerating dysphagia diet but with decreased intake. Renal function worsening, with nephrology consult now requested. Hgb 7 but with out overt GI bleeding.     Plan: Follow H/H with transfusion as needed BID PPI Outpatient visit in 4-6 weeks Outpatient colonoscopy/EGD with Miralax prep (nectar thick liquids) Nephrology consulted    Annitta Needs, PhD, ANP-BC [facility]     LOS: 5 days    12/09/2018,  9:29 AM

## 2018-12-09 NOTE — Care Management Important Message (Signed)
Important Message  Patient Details  Name: Cameron Boyle MRN: [id number] Date of Birth: [birth year]   Medicare Important Message Given:  Yes    [name] 12/09/2018, 12:35 PM

## 2018-12-09 NOTE — Plan of Care (Signed)
  Problem: Acute Rehab PT Goals(only PT should resolve) Goal: Patient Will Transfer Sit To/From Stand Flowsheets (Taken 12/09/2018 1100) Patient will transfer sit to/from stand: with minimal assist Goal: Pt Will Transfer Bed To Chair/Chair To Bed Flowsheets (Taken 12/09/2018 1100) Pt will Transfer Bed to Chair/Chair to Bed: with min assist; with mod assist Goal: Pt Will Ambulate Flowsheets (Taken 12/09/2018 1100) Pt will Ambulate: 15 feet; with minimal assist; with rolling walker    Geraldine Solar PT, DPT

## 2018-12-09 NOTE — Progress Notes (Signed)
  Speech Language Pathology Treatment: Dysphagia  Patient Details Name: Cameron Boyle MRN: [id number] DOB: 1936-01-01 Today's Date: 12/09/2018 Time: 0315-9458 SLP Time Calculation (min) (ACUTE ONLY): 23 min  Assessment / Plan / Recommendation Clinical Impression  Pt received sitting up in chair slumped to his right with audible secretions and labored breathing. Pt repositioned, oral care provided, and cued to cough strongly in attempt to clear secretions. Pt appears to have declined since previous SLP visit on Saturday. Pt with known oropharyngeal dysphagia and is at high risk for aspiration. Will proceed with MBSS today. RT arrived for breathing treatment. Recommendations to follow pending results of MBSS.   HPI HPI: 83 year old male presenting for symptomaticnormocyticanemia. Hospitalized back in November with cryptogenic stroke, initially on dual therapy with Plavix and aspirin and subsequently on Plavix only. At time of discharge on November 4 thhis hemoglobin was 10.4. Presented yesterday with a hemoglobin of 6.7. Had noted progressive weakness, lower extremity edema but no overt melena, rectal bleeding. Stool heme-negative here. Received 1 unit of packed red blood cells, 1 dose of Feraheme. Remains on Plavix. Hemoglobin up to 7.9 yesterday and drift to 7.5 this morning.Suspected occult GI bleeding in the setting of Plavix. Planned colonoscopy/EGD today, however overnight the patient began having significant coughing with GoLytely prep. Chest XRay showed patchy bilateral airspace worsening since prior study concerning for pneumonia. Concerning for aspiration pna and patient unable to complete bowel prep so TCS/EGD was cancelled. BSE requested. Pt is known to this SLP from [place] (09/26/18) and outpatient dysphagia therapy for mod/severe sensorimotor based pharyngeal dysphagia. Pt was discharged from outpatient therapy in February with recommendation to continue HEP indefinitely (Pt with reduced  awareness of severity of dysphagia).      SLP Plan  MBS       Recommendations                   Oral Care Recommendations: Oral care BID;Staff/trained caregiver to provide oral care Follow up Recommendations: Skilled Nursing facility SLP Visit Diagnosis: Dysphagia, oropharyngeal phase (R13.12) Plan: MBS       Thank you,  [name] Churn, [place]                 [place] 12/09/2018, 2:03 PM

## 2018-12-09 NOTE — Progress Notes (Signed)
Pharmacy Antibiotic Note  Cameron Boyle is a 83 y.o. male admitted on 11/26/2018 with aspiration pneumonia.  Pharmacy has been consulted for Unasyn dosing.  Plan: Unasyn 3gm IV q24h F/U cxs and clinical progress Monitor V/S and labs  Height: 5\' 10"  (177.8 cm) Weight: 137 lb 2 oz (62.2 kg) IBW/kg (Calculated) : 73  Temp (24hrs), Avg:98.8 F (37.1 C), Min:98.2 F (36.8 C), Max:99.2 F (37.3 C)  Recent Labs  Lab 11/22/2018 1110 12/05/18 0455 12/14/2018 0426 12/14/2018 0830 12/07/18 0510 12/08/18 0641 12/09/18 0452  WBC 8.3 11.5*  --  14.2* 9.5  --  9.8  CREATININE 3.94* 4.07* 4.66*  --  4.93* 5.14* 5.15*    Estimated Creatinine Clearance: 10.1 mL/min (A) (by C-G formula based on SCr of 5.15 mg/dL (H)).    No Known Allergies  Antimicrobials this admission: Unasyn 3/23>>  Ceftriaxone 3/20>> 3/23 Azithromycin 3/20>> 3/23  Dose adjustments this admission: n/a  Microbiology results: MRSA PCR:   Thank you for allowing pharmacy to be a part of this patient's care.  Isac Sarna, BS [place], [place] Clinical Pharmacist Pager [phone] 12/09/2018 5:28 PM

## 2018-12-10 LAB — RENAL FUNCTION PANEL
Albumin: 2.4 g/dL — ABNORMAL LOW (ref 3.5–5.0)
Anion gap: 12 (ref 5–15)
BUN: 85 mg/dL — ABNORMAL HIGH (ref 8–23)
CO2: 14 mmol/L — ABNORMAL LOW (ref 22–32)
Calcium: 8.4 mg/dL — ABNORMAL LOW (ref 8.9–10.3)
Chloride: 115 mmol/L — ABNORMAL HIGH (ref 98–111)
Creatinine, Ser: 5.26 mg/dL — ABNORMAL HIGH (ref 0.61–1.24)
GFR calc Af Amer: 11 mL/min — ABNORMAL LOW (ref >60)
GFR calc non Af Amer: 10 mL/min — ABNORMAL LOW (ref >60)
Glucose, Bld: 169 mg/dL — ABNORMAL HIGH (ref 70–99)
PHOSPHORUS: 7.7 mg/dL — AB (ref 2.5–4.6)
Potassium: 4.4 mmol/L (ref 3.5–5.1)
Sodium: 141 mmol/L (ref 135–145)

## 2018-12-10 LAB — CBC
HCT: 23.5 % — ABNORMAL LOW (ref 39.0–52.0)
Hemoglobin: 7.2 g/dL — ABNORMAL LOW (ref 13.0–17.0)
MCH: 28.3 pg (ref 26.0–34.0)
MCHC: 30.6 g/dL (ref 30.0–36.0)
MCV: 92.5 fL (ref 80.0–100.0)
Platelets: 181 10*3/uL (ref 150–400)
RBC: 2.54 MIL/uL — ABNORMAL LOW (ref 4.22–5.81)
RDW: 14.4 % (ref 11.5–15.5)
WBC: 9.7 10*3/uL (ref 4.0–10.5)
nRBC: 0 % (ref 0.0–0.2)

## 2018-12-10 MED ORDER — CHLORHEXIDINE GLUCONATE CLOTH 2 % EX PADS
6.0000 | MEDICATED_PAD | Freq: Once | CUTANEOUS | Status: AC
  Administered 2018-12-11: 6 via TOPICAL

## 2018-12-10 MED ORDER — FREE WATER
200.0000 mL | Status: AC
  Administered 2018-12-10: 200 mL

## 2018-12-10 MED ORDER — PREDNISONE 20 MG PO TABS: 40.0000 mg | ORAL_TABLET | Freq: Every day | ORAL | Status: DC

## 2018-12-10 MED ORDER — FREE WATER
120.0000 mL | Status: DC
  Administered 2018-12-10 (×3): 120 mL

## 2018-12-10 MED ORDER — CHLORHEXIDINE GLUCONATE CLOTH 2 % EX PADS
6.0000 | MEDICATED_PAD | Freq: Once | CUTANEOUS | Status: AC
  Administered 2018-12-10: 6 via TOPICAL

## 2018-12-10 MED ORDER — FREE WATER
120.0000 mL | Status: DC
  Administered 2018-12-11 – 2018-12-12 (×6): 120 mL

## 2018-12-10 MED ORDER — PRO-STAT SUGAR FREE PO LIQD
30.0000 mL | Freq: Every day | ORAL | Status: DC
  Administered 2018-12-10 – 2018-12-15 (×5): 30 mL
  Filled 2018-12-10 (×5): qty 30

## 2018-12-10 MED ORDER — OSMOLITE 1.5 CAL PO LIQD
1000.0000 mL | ORAL | Status: DC
  Administered 2018-12-11: 1000 mL
  Filled 2018-12-10 (×4): qty 1000

## 2018-12-10 MED ORDER — ENSURE ENLIVE PO LIQD
237.0000 mL | Freq: Four times a day (QID) | ORAL | Status: DC
  Administered 2018-12-10 (×2): 237 mL via ORAL

## 2018-12-10 MED ORDER — PREDNISONE 20 MG PO TABS
40.0000 mg | ORAL_TABLET | Freq: Every day | ORAL | Status: AC
  Administered 2018-12-12: 40 mg via ORAL
  Filled 2018-12-10: qty 2

## 2018-12-10 MED ORDER — ENSURE ENLIVE PO LIQD
2.0000 | Freq: Once | ORAL | Status: AC
  Administered 2018-12-10: 474 mL via ORAL

## 2018-12-10 NOTE — Progress Notes (Signed)
  Speech Language Pathology Treatment: Dysphagia  Patient Details Name: Cameron Boyle MRN: [id number] DOB: Jan 21, 1936 Today's Date: 12/10/2018 Time: 1035-1100 SLP Time Calculation (min) (ACUTE ONLY): 25 min  Assessment / Plan / Recommendation Clinical Impression  Pt seen at bedside for ongoing education and dysphagia intervention following MBSS completed yesterday. Pt with functionally mod/severe pharyngoesophageal dysphagia with reduced UES relaxation and overall reduced pharyngeal pressure significantly worse from MBSS completed in January. Pt with occasional relaxation of UES during MBSS yesterday, which allowed for pyriform clearance, however most of bolus remained in pharynx for the duration of the test. Pt currently has NG in place with plans for PEG placement tomorrow. SLP provided oral care this date and encouraged Pt to cough and clear secretions, which he was able to do with verbal cues. Pt provided several ice chips for comfort and for pharyngeal engagement which resulted in immediate and delayed coughing. SLP also called [name] on the phone and relayed the results and recommendations from [facility] and current plan of care. She was encouraged to assist her husband with good oral care at home and to provide single ice chips and/or small sips of water after oral care at home. SLP further recommends [facility] SLP services to continue addressing dysphagia and PT WILL NEED OUTPATIENT FOLLOW UP AS SOON AS ABLE ([facility]- [name], ENT) to see if Pt is a candidate for UES dilation or Botox.     HPI HPI: 83 year old male presenting for symptomaticnormocyticanemia. Hospitalized back in November with cryptogenic stroke, initially on dual therapy with Plavix and aspirin and subsequently on Plavix only. At time of discharge on November 4 thhis hemoglobin was 10.4. Presented yesterday with a hemoglobin of 6.7. Had noted progressive weakness, lower extremity edema but no overt melena, rectal bleeding.  Stool heme-negative here. Received 1 unit of packed red blood cells, 1 dose of Feraheme. Remains on Plavix. Hemoglobin up to 7.9 yesterday and drift to 7.5 this morning.Suspected occult GI bleeding in the setting of Plavix. Planned colonoscopy/EGD today, however overnight the patient began having significant coughing with GoLytely prep. Chest XRay showed patchy bilateral airspace worsening since prior study concerning for pneumonia. Concerning for aspiration pna and patient unable to complete bowel prep so TCS/EGD was cancelled. BSE requested. Pt is known to this SLP from [place] (09/26/18) and outpatient dysphagia therapy for mod/severe sensorimotor based pharyngeal dysphagia. Pt was discharged from outpatient therapy in February with recommendation to continue HEP indefinitely (Pt with reduced awareness of severity of dysphagia).      SLP Plan  Continue with current plan of care       Recommendations  Diet recommendations: NPO Medication Administration: Via alternative means                Oral Care Recommendations: Oral care prior to ice chip/H20;Staff/trained caregiver to provide oral care Follow up Recommendations: Home health SLP SLP Visit Diagnosis: Dysphagia, pharyngoesophageal phase (R13.14) Plan: Continue with current plan of care       Thank you,  [name] Churn, [place]                [name] 12/10/2018, 11:11 AM

## 2018-12-10 NOTE — Progress Notes (Signed)
Initial Nutrition Assessment  DOCUMENTATION CODES:  Not applicable  INTERVENTION:  Initiate TF via NG/PEG with Osmolite 1.5 at goal rate of 50 ml/h (1200 ml per day) and Prostat 30 ml Q24 hrs to provide 1900 kcals, 90 gm protein, 914 ml free water daily.  Given pts tenuous fluid status, will defer fluid management to MD and not order flushes at this juncture.   NUTRITION DIAGNOSIS:  Inadequate oral intake related to inability to eat as evidenced by NPO status.  GOAL:  Patient will meet greater than or equal to 90% of their needs  MONITOR:  PO intake, Labs, I & O's, Weight trends, Skin  REASON FOR ASSESSMENT:  Consult Enteral/tube feeding initiation and management  ASSESSMENT:  83 y/o male PMHx CHF, CVA w/ residual dysphagia, CKD4, HLD, HTN. Presented to ED w/ 1-2 month history of DOE. Labs significant for hgb of 6.7 + elevated creatinine. Pt also having s/s consistent w/ fluid overload. Admitted for transfusion and management of AoC CHF & AoC renal failure.   Per current department procedures, assessment done remotely. Pt not seen in person.   Clinical nutrition newly consulted for TF. Pt was initially admitted 3/18. Brief admission synopsis: Pt noted to have coughing w/ liquid medications 3/19. He suffered respiratory decline and CXR that evening revealed new PNA. ST evaluated pt 3/21 and deemed pt to be at moderate aspiration risk. He was placed on D2, NTL diet. On repeat swallow eval 3/23, pt showed further decline and after MBS, pt was recommended to remain NPO w/ nutrition delivered via alternate means w/ PEG planned 3/25   Per meal records, prior 3/23, pt appears to have been eating relatively well, with most meals being 40-75% completed.    Wt wise, pt was documented as being 123 lbs while hospitalized last November for his CVA. This admission, he presented at ~135 lbs, however he also was noted as edematous. He was able to diurese down to 130 lbs, but d/t worsening renal  function,  lasix was held 3/21 and his wt  quickly re accumulated:  3/18 (Admit) ~135 lbs 3/20:135.3 lbs 3/21:130.8 3/22:132.7 3/23:137.1 3/24:138.4  Will use wt of 130 lbs for kcal/pro dosing purposes.    Labs: BG:169, Creat: 4.93>5.14>5.15>5.26, Albumin:2.4, Hgb: 7.2. K: 4.4 Meds: Ensure Enlive, Free water 120 cc Q 4, methylprednisolone, PPI, IVF  Recent Labs  Lab 12/08/18 0641 12/09/18 0452 12/10/18 0500  NA 139 141 141  K 4.4 4.5 4.4  CL 111 114* 115*  CO2 17* 17* 14*  BUN 85* 83* 85*  CREATININE 5.14* 5.15* 5.26*  CALCIUM 8.0* 8.2* 8.4*  PHOS  --  7.3* 7.7*  GLUCOSE 111* 104* 169*   NUTRITION - FOCUSED PHYSICAL EXAM: Unable to conduct- covid precautions  Diet Order:   Diet Order            Diet NPO time specified Except for: Sips with Meds, Ice Chips  Diet effective now             EDUCATION NEEDS:  Not appropriate for education at this time  Skin:  Skin Assessment: Reviewed RN Assessment  Last BM: Skin Tear to buttocks  Height:  Ht Readings from Last 1 Encounters:  12/16/2018 5\' 10"  (1.778 m)   Weight:  Wt Readings from Last 1 Encounters:  12/10/18 62.8 kg   Wt Readings from Last 10 Encounters:  12/10/18 62.8 kg  08/20/18 63.5 kg  07/22/18 55.8 kg  11/02/12 60.3 kg  07/09/11 59.9 kg  07/02/11 59  kg   Ideal Body Weight:  75.45 kg  BMI:  Body mass index is 19.87 kg/m.  Dosing (59.45 kg) Estimated Nutritional Needs:  Kcal:  1800-1950 (30-33 kcal/kg bw) Protein:  83-95g Pro (1.4-1.6g/kg bw) Fluid:  1.8-2 Liters (1 ml/kcal)  Burtis Junes RD, LDN, CNSC Clinical Nutrition Available Tues-Sat via Pager: [phone] 12/10/2018 3:08 PM

## 2018-12-10 NOTE — Consult Note (Signed)
Reason for Consult: Dysphasia, aspiration pneumonia, CVA Referring Physician: Dr. Erin Hearing is an 83 y.o. male.  HPI: Patient is an 83 year old white male with multiple medical problems, status post a CVA in October 2019 who presents with progressive dysphasia and poor p.o. intake.  He has a proximal esophageal stricture which ultimately needs to be addressed by ENT.  He is having multiple episodes of aspiration and cannot maintain his p.o. intake.  He denies any pain.  Surgery has been asked to place a gastrostomy tube to help supplement nutrition and medication use.  Past Medical History:  Diagnosis Date  . Arthritis    left knee  . Stroke [facility])    07/18/18    Past Surgical History:  Procedure Laterality Date  . APPENDECTOMY    . left knee surgery    . LOOP RECORDER INSERTION N/A 07/22/2018   Procedure: LOOP RECORDER INSERTION;  Surgeon: Evans Lance, MD;  Location: [place] [facility];  Service: Cardiovascular;  Laterality: N/A;  . TEE WITHOUT CARDIOVERSION N/A 07/22/2018   Procedure: TRANSESOPHAGEAL ECHOCARDIOGRAM (TEE);  Surgeon: Dorothy Spark, MD;  Location: [facility] ENDOSCOPY;  Service: Cardiovascular;  Laterality: N/A;  . TONSILLECTOMY      History reviewed. No pertinent family history.  Social History:  reports that he has quit smoking. He has never used smokeless tobacco. He reports that he does not drink alcohol or use drugs.  Allergies: No Known Allergies  Medications: I have reviewed the patient's current medications.  Results for orders placed or performed during the hospital encounter of 12/16/2018 (from the past 48 hour(s))  CBC     Status: Abnormal   Collection Time: 12/09/18  4:52 AM  Result Value Ref Range   WBC 9.8 4.0 - 10.5 K/uL   RBC 2.49 (L) 4.22 - 5.81 MIL/uL   Hemoglobin 7.0 (L) 13.0 - 17.0 g/dL   HCT 23.4 (L) 39.0 - 52.0 %   MCV 94.0 80.0 - 100.0 fL   MCH 28.1 26.0 - 34.0 pg   MCHC 29.9 (L) 30.0 - 36.0 g/dL   RDW 14.3 11.5 - 15.5 %    Platelets 167 150 - 400 K/uL   nRBC 0.0 0.0 - 0.2 %    Comment: Performed at [facility], [address] [place], [place] [address]  Renal function panel     Status: Abnormal   Collection Time: 12/09/18  4:52 AM  Result Value Ref Range   Sodium 141 135 - 145 mmol/L   Potassium 4.5 3.5 - 5.1 mmol/L   Chloride 114 (H) 98 - 111 mmol/L   CO2 17 (L) 22 - 32 mmol/L   Glucose, Bld 104 (H) 70 - 99 mg/dL   BUN 83 (H) 8 - 23 mg/dL   Creatinine, Ser 5.15 (H) 0.61 - 1.24 mg/dL   Calcium 8.2 (L) 8.9 - 10.3 mg/dL   Phosphorus 7.3 (H) 2.5 - 4.6 mg/dL   Albumin 2.5 (L) 3.5 - 5.0 g/dL   GFR calc non Af Amer 10 (L) >60 mL/min   GFR calc Af Amer 11 (L) >60 mL/min   Anion gap 10 5 - 15    Comment: Performed at [facility], [address] [place], [place] [address]  Hepatic function panel     Status: Abnormal   Collection Time: 12/09/18  4:52 AM  Result Value Ref Range   Total Protein 6.1 (L) 6.5 - 8.1 g/dL   Albumin 2.5 (L) 3.5 - 5.0 g/dL   AST  47 (H) 15 - 41 U/L   ALT 27 0 - 44 U/L   Alkaline Phosphatase 74 38 - 126 U/L   Total Bilirubin 0.5 0.3 - 1.2 mg/dL   Bilirubin, Direct 0.1 0.0 - 0.2 mg/dL   Indirect Bilirubin 0.4 0.3 - 0.9 mg/dL    Comment: Performed at [facility], [address] [place], [place] [address]  Lipase, blood     Status: None   Collection Time: 12/09/18  4:52 AM  Result Value Ref Range   Lipase 32 11 - 51 U/L    Comment: Performed at [facility], [address] [place], [place] [address]  Type and screen [facility]     Status: None   Collection Time: 12/09/18  5:28 PM  Result Value Ref Range   ABO/RH(D) B POS    Antibody Screen NEG    Sample Expiration      12/12/2018 Performed at [facility], [address], [place], [place] [address]   CBC     Status: Abnormal   Collection Time: 12/10/18  5:00 AM  Result Value Ref Range   WBC 9.7 4.0 - 10.5 K/uL   RBC 2.54 (L) 4.22 - 5.81 MIL/uL   Hemoglobin 7.2 (L) 13.0 - 17.0 g/dL   HCT 23.5 (L) 39.0 - 52.0 %    MCV 92.5 80.0 - 100.0 fL   MCH 28.3 26.0 - 34.0 pg   MCHC 30.6 30.0 - 36.0 g/dL   RDW 14.4 11.5 - 15.5 %   Platelets 181 150 - 400 K/uL   nRBC 0.0 0.0 - 0.2 %    Comment: Performed at [facility], [address] [place], [place] [address]  Renal function panel     Status: Abnormal   Collection Time: 12/10/18  5:00 AM  Result Value Ref Range   Sodium 141 135 - 145 mmol/L   Potassium 4.4 3.5 - 5.1 mmol/L   Chloride 115 (H) 98 - 111 mmol/L   CO2 14 (L) 22 - 32 mmol/L   Glucose, Bld 169 (H) 70 - 99 mg/dL   BUN 85 (H) 8 - 23 mg/dL   Creatinine, Ser 5.26 (H) 0.61 - 1.24 mg/dL   Calcium 8.4 (L) 8.9 - 10.3 mg/dL   Phosphorus 7.7 (H) 2.5 - 4.6 mg/dL   Albumin 2.4 (L) 3.5 - 5.0 g/dL   GFR calc non Af Amer 10 (L) >60 mL/min   GFR calc Af Amer 11 (L) >60 mL/min   Anion gap 12 5 - 15    Comment: Performed at [facility], [address], [place], [place] [address]    Dg Shoulder Right  Result Date: 12/09/2018 CLINICAL DATA:  Right shoulder pain. No known injury. EXAM: RIGHT SHOULDER - 2+ VIEW COMPARISON:  None. FINDINGS: No fracture or dislocation. Mild acromioclavicular degenerative change. No bony destruction or suspicious bone lesion. No periarticular soft tissue calcifications. IMPRESSION: Mild acromioclavicular degenerative change. No acute bony abnormality. Electronically Signed   By: Keith Rake M.D.   On: 12/09/2018 19:17   Dg Chest Port 1 View  Result Date: 12/09/2018 CLINICAL DATA:  Nasogastric tube placement. EXAM: PORTABLE CHEST 1 VIEW COMPARISON:  Chest radiograph 12/07/2018 FINDINGS: Enteric tube in place with tip below the diaphragm not included in the field of view, the side port is just beyond the gastroesophageal junction. Implanted loop recorder again projects over the left chest wall. Multifocal bilateral airspace opacities, slight worsening over the last 2 days. Unchanged heart size and mediastinal contours. Possible  small right pleural effusion. No pneumothorax.  IMPRESSION: 1. Enteric tube in place with tip below the diaphragm not included in the field of view, the side port is just beyond the gastroesophageal junction in the stomach. 2. Multifocal bilateral airspace opacities suspicious for pneumonia, slight worsening over the last 2 days. Electronically Signed   By: Keith Rake M.D.   On: 12/09/2018 19:16   Dg Swallowing Func-speech Pathology  Result Date: 12/09/2018 Objective Swallowing Evaluation: Type of Study: MBS-Modified Barium Swallow Study  Patient Details Name: ZARIF RATHJE MRN: [id number] Date of Birth: 1935/11/30 Today's Date: 12/09/2018 Time: SLP Start Time (ACUTE ONLY): 1435 -SLP Stop Time (ACUTE ONLY): 1512 SLP Time Calculation (min) (ACUTE ONLY): 37 min Past Medical History: Past Medical History: Diagnosis Date . Arthritis   left knee . Stroke [facility])   07/18/18 Past Surgical History: Past Surgical History: Procedure Laterality Date . APPENDECTOMY   . left knee surgery   . LOOP RECORDER INSERTION N/A 07/22/2018  Procedure: LOOP RECORDER INSERTION;  Surgeon: Evans Lance, MD;  Location: [place] [facility] LAB;  Service: Cardiovascular;  Laterality: N/A; . TEE WITHOUT CARDIOVERSION N/A 07/22/2018  Procedure: TRANSESOPHAGEAL ECHOCARDIOGRAM (TEE);  Surgeon: Dorothy Spark, MD;  Location: [facility] ENDOSCOPY;  Service: Cardiovascular;  Laterality: N/A; . TONSILLECTOMY   HPI: 83 year old male presenting for symptomaticnormocyticanemia. Hospitalized back in November with cryptogenic stroke, initially on dual therapy with Plavix and aspirin and subsequently on Plavix only. At time of discharge on November 4 thhis hemoglobin was 10.4. Presented yesterday with a hemoglobin of 6.7. Had noted progressive weakness, lower extremity edema but no overt melena, rectal bleeding. Stool heme-negative here. Received 1 unit of packed red blood cells, 1 dose of Feraheme. Remains on Plavix. Hemoglobin up to 7.9 yesterday and drift to 7.5 this morning.Suspected occult GI  bleeding in the setting of Plavix. Planned colonoscopy/EGD today, however overnight the patient began having significant coughing with GoLytely prep. Chest XRay showed patchy bilateral airspace worsening since prior study concerning for pneumonia. Concerning for aspiration pna and patient unable to complete bowel prep so TCS/EGD was cancelled. BSE requested. Pt is known to this SLP from [place] (09/26/18) and outpatient dysphagia therapy for mod/severe sensorimotor based pharyngeal dysphagia. Pt was discharged from outpatient therapy in February with recommendation to continue HEP indefinitely (Pt with reduced awareness of severity of dysphagia).  Subjective: "Soda pop goes down ok." Special Tests: MBSS Assessment / Plan / Recommendation CHL IP CLINICAL IMPRESSIONS 12/09/2018 Clinical Impression Pt presents with worsening pharyngoesophageal phase dysphagia from 09/26/2018 characterized by reduced tongue base retraction, reduced epiglottic deflection, reduced pharyngeal pressure, and reduced UES relaxation resulting in moderate vallecular residue and mod/severe pharyngeal residue across consistencies presented, however worse with thicker textures/consistencies. Although Pt only aspirated trace amounts (during and post swallow from residuals), he is unlikely to meet nutritional needs given severity of and inefficiency of pharyngoesophageal swallow. Several postures were trialed and head turn to the Left found to be inconsistently effective (also to the RIGHT) resulting in occasional relaxation of UES and passage of bolus. This was similar to his MBSS in January 2020, however with increased severity at this time. Pt is also deconditioned, which further negatively impacts his swallow efficiency and safety. Pt was seen for dysphagia therapy in February to address dysphagia and reported tolerating some regular textures and thin liquids at home, however continued to present with wet vocal quality. Recommend alternative means of  nutrition until he can be seen by ENT who could determine if Pt would  be a candidate for UES dilation or Botox and try dysphagia therapy again following. SLP discussed with Dr. Roxan Hockey who will discuss with Pt, surgery, and ENT. Consider NPO with alternative means of nutrition vs NTL only now; ice chips PRN after oral care, and continue ORAL care. SLP will follow per goals of care.  SLP Visit Diagnosis Dysphagia, pharyngoesophageal phase (R13.14) Attention and concentration deficit following -- Frontal lobe and executive function deficit following -- Impact on safety and function Moderate aspiration risk;Risk for inadequate nutrition/hydration   CHL IP TREATMENT RECOMMENDATION 12/09/2018 Treatment Recommendations Therapy as outlined in treatment plan below   Prognosis 12/09/2018 Prognosis for Safe Diet Advancement Guarded Barriers to Reach Goals Severity of deficits Barriers/Prognosis Comment -- CHL IP DIET RECOMMENDATION 12/09/2018 SLP Diet Recommendations Alternative means - long-term;Ice chips PRN after oral care Liquid Administration via -- Medication Administration Via alternative means Compensations -- Postural Changes --   CHL IP OTHER RECOMMENDATIONS 12/09/2018 Recommended Consults Consider ENT evaluation Oral Care Recommendations Staff/trained caregiver to provide oral care;Oral care QID Other Recommendations --   CHL IP FOLLOW UP RECOMMENDATIONS 12/09/2018 Follow up Recommendations Skilled Nursing facility   [facility] IP FREQUENCY AND DURATION 12/09/2018 Speech Therapy Frequency (ACUTE ONLY) min 2x/week Treatment Duration 1 week      CHL IP ORAL PHASE 12/09/2018 Oral Phase WFL Oral - Pudding Teaspoon -- Oral - Pudding Cup -- Oral - Honey Teaspoon -- Oral - Honey Cup -- Oral - Nectar Teaspoon -- Oral - Nectar Cup -- Oral - Nectar Straw -- Oral - Thin Teaspoon -- Oral - Thin Cup -- Oral - Thin Straw -- Oral - Puree -- Oral - Mech Soft -- Oral - Regular -- Oral - Multi-Consistency -- Oral - Pill -- Oral Phase -  Comment --  CHL IP PHARYNGEAL PHASE 12/09/2018 Pharyngeal Phase Impaired Pharyngeal- Pudding Teaspoon -- Pharyngeal -- Pharyngeal- Pudding Cup -- Pharyngeal -- Pharyngeal- Honey Teaspoon Delayed swallow initiation-vallecula;Delayed swallow initiation-pyriform sinuses;Reduced pharyngeal peristalsis;Reduced epiglottic inversion;Reduced tongue base retraction;Penetration/Aspiration during swallow;Penetration/Apiration after swallow;Pharyngeal residue - valleculae;Pharyngeal residue - pyriform;Pharyngeal residue - posterior pharnyx Pharyngeal Material enters airway, CONTACTS cords and not ejected out Pharyngeal- Honey Cup -- Pharyngeal -- Pharyngeal- Nectar Teaspoon Delayed swallow initiation-vallecula;Delayed swallow initiation-pyriform sinuses;Reduced pharyngeal peristalsis;Reduced epiglottic inversion;Reduced tongue base retraction;Penetration/Aspiration during swallow;Penetration/Apiration after swallow;Pharyngeal residue - valleculae;Pharyngeal residue - pyriform;Pharyngeal residue - posterior pharnyx Pharyngeal Material enters airway, CONTACTS cords and not ejected out Pharyngeal- Nectar Cup -- Pharyngeal -- Pharyngeal- Nectar Straw -- Pharyngeal -- Pharyngeal- Thin Teaspoon Delayed swallow initiation-vallecula;Delayed swallow initiation-pyriform sinuses;Reduced pharyngeal peristalsis;Reduced epiglottic inversion;Reduced tongue base retraction;Penetration/Aspiration during swallow;Penetration/Apiration after swallow;Pharyngeal residue - valleculae;Pharyngeal residue - pyriform;Pharyngeal residue - posterior pharnyx Pharyngeal Material enters airway, passes BELOW cords without attempt by patient to eject out (silent aspiration);Material enters airway, passes BELOW cords and not ejected out despite cough attempt by patient Pharyngeal- Thin Cup -- Pharyngeal -- Pharyngeal- Thin Straw -- Pharyngeal -- Pharyngeal- Puree Delayed swallow initiation-vallecula;Reduced pharyngeal peristalsis;Reduced epiglottic  inversion;Reduced tongue base retraction;Penetration/Apiration after swallow;Pharyngeal residue - valleculae;Pharyngeal residue - pyriform;Pharyngeal residue - posterior pharnyx Pharyngeal Material enters airway, CONTACTS cords and then ejected out;Material does not enter airway Pharyngeal- Mechanical Soft -- Pharyngeal -- Pharyngeal- Regular -- Pharyngeal -- Pharyngeal- Multi-consistency -- Pharyngeal -- Pharyngeal- Pill -- Pharyngeal -- Pharyngeal Comment --  CHL IP CERVICAL ESOPHAGEAL PHASE 12/09/2018 Cervical Esophageal Phase Impaired Pudding Teaspoon -- Pudding Cup -- Honey Teaspoon -- Honey Cup -- Nectar Teaspoon -- Nectar Cup -- Nectar Straw -- Thin Teaspoon -- Thin Cup -- Thin Straw --  Puree Reduced cricopharyngeal relaxation;Prominent cricopharyngeal segment Mechanical Soft -- Regular -- Multi-consistency -- Pill -- Cervical Esophageal Comment -- Thank you, [name] Churn, [place] 12/09/2018, 4:33 PM               ROS:  Pertinent items are noted in HPI.  Blood pressure 132/62, pulse 87, temperature 98 F (36.7 C), temperature source Oral, resp. rate 19, height 5\' 10"  (1.778 m), weight 62.8 kg, SpO2 95 %. Physical Exam: Pleasant white male no acute distress Head is normocephalic, atraumatic, NG tube in place. Heart examination reveals a regular rate and rhythm without S3, S4, murmurs Abdomen is soft, flat.  No upper abdominal surgical scars present. Chart reviewed  Assessment/Plan: Impression: Dysphasia, status post CVA, poor p.o. intake, anemia of unknown etiology Plan: Patient is scheduled for an EGD with PEG on 12/17/2018.  The risks and benefits of the procedure including bleeding, infection, and organ injury were fully explained to the patient, who gave informed consent.  Aviva Signs 12/10/2018, 8:11 AM

## 2018-12-10 NOTE — Progress Notes (Signed)
Physical Therapy Treatment Patient Details Name: Cameron Boyle MRN: [id number] DOB: 1936-01-04 Today's Date: 12/10/2018    History of Present Illness Cameron Boyle is a 83 y.o. male with medical history of diastolic dysfunction, right MCA stroke, hypertension, hyperlipidemia, CKD stage IV presenting with 1 to 59-month history of dyspnea on exertion.  Patient had routine blood work obtained by his PCP on 11/28/2018.  His hemoglobin was 6.3.  However further history reveals that the patient has had increasing generalized weakness, fatigue, and dyspnea on exertion for nearly 2 months.  He also complains of increasing lower extremity edema.  He denies any frank PND orthopnea.  He denies any chest pain, fevers, chills, coughing, hemoptysis.  There is been no hematochezia, melena, hematemesis, hematuria.    PT Comments    Pt. Supine in bed and willing to participate with therapy today.  Pt making slow improvements with independence with bed mobility, increased labored time to complete.   Min to mod A with transfer training, short steps transfer to chair.  Once sitting pt stated he needed BSC for BM.  RN walked into room during transfer to [facility] and noted sore on buttock with dressings applied.  Cathered fell off pt, NT notified to replace at [place].  Pt left in chair with call bell and phone within reach, chair alarm set and RN in room.  No reports of pain, was limited by fatigue.  Follow Up Recommendations  SNF     Equipment Recommendations  None recommended by PT    Recommendations for Other Services       Precautions / Restrictions Precautions Precautions: Fall Restrictions Weight Bearing Restrictions: No    Mobility  Bed Mobility Overal bed mobility: Modified Independent             General bed mobility comments: increased time  Transfers Overall transfer level: Modified independent Equipment used: Rolling walker (2 wheeled) Transfers: Sit to/from [facility] Sit to  Stand: Mod assist Stand pivot transfers: Min assist;Mod assist       General transfer comment: mod A with STS and small steps to chair.  Once sitting realized need for BSC, SPT with min--mod A with good handplacement following initial cueing.  Increased time to complete and limited by fatigue  Ambulation/Gait Ambulation/Gait assistance: Mod assist Gait Distance (Feet): 4 Feet Assistive device: Rolling walker (2 wheeled) Gait Pattern/deviations: Step-to pattern;Decreased stride length     General Gait Details: slow labored movement wiht RW   Stairs             Wheelchair Mobility    Modified Rankin (Stroke Patients Only)       Balance                                            Cognition Arousal/Alertness: Awake/alert Behavior During Therapy: WFL for tasks assessed/performed Overall Cognitive Status: Within Functional Limits for tasks assessed                                        Exercises General Exercises - Lower Extremity Long Arc Quad: Both;10 reps Toe Raises: Both;10 reps;Seated Heel Raises: Both;10 reps;Seated    General Comments        Pertinent Vitals/Pain Pain Assessment: No/denies pain    Home Living  Prior Function            PT Goals (current goals can now be found in the care plan section) Acute Rehab PT Goals Patient Stated Goal: to go home PT Goal Formulation: With patient Time For Goal Achievement: 12/16/18 Potential to Achieve Goals: Fair Progress towards PT goals: Progressing toward goals    Frequency    Min 3X/week      PT Plan Current plan remains appropriate    Co-evaluation              AM-PAC PT "6 Clicks" Mobility   Outcome Measure  Help needed turning from your back to your side while in a flat bed without using bedrails?: None Help needed moving from lying on your back to sitting on the side of a flat bed without using bedrails?: A  Little Help needed moving to and from a bed to a chair (including a wheelchair)?: A Lot Help needed standing up from a chair using your arms (e.g., wheelchair or bedside chair)?: A Lot Help needed to walk in hospital room?: A Lot Help needed climbing 3-5 steps with a railing? : Total 6 Click Score: 14    End of Session Equipment Utilized During Treatment: Gait belt Activity Tolerance: Patient limited by fatigue Patient left: in chair;with call bell/phone within reach;with nursing/sitter in room Nurse Communication: Mobility status(NT notified for pure wic and RN noticed sore on buttock, dressing applied) PT Visit Diagnosis: Unsteadiness on feet (R26.81);Muscle weakness (generalized) (M62.81);Difficulty in walking, not elsewhere classified (R26.2)     Time: 8682-5749 PT Time Calculation (min) (ACUTE ONLY): 35 min  Charges:  $Therapeutic Activity: 23-37 mins                     [address], LPTA; CBIS [phone]  Aldona Lento 12/10/2018, 10:40 AM

## 2018-12-10 NOTE — Progress Notes (Signed)
Patient Demographics:    Cameron Boyle, is a 83 y.o. male, DOB - 01-17-1936, PJS:[id number]  Admit date - 11/19/2018   Admitting Physician Orson Eva, MD  Outpatient Primary MD for the patient is [name], MD (Inactive)  LOS - 6   Chief Complaint  Patient presents with   Abnormal Lab        Subjective:    Cameron Boyle today has no further fevers, no emesis,  No chest pain,   right shoulder pain noted, tolerating NG tube feeding well....   Assessment  & Plan :    Active Problems:   Acute blood loss anemia   Acute on chronic diastolic CHF (congestive heart failure) (HCC)   Acute renal failure superimposed on stage 4 chronic kidney disease (HCC)   Transfusion-dependent anemia   Cough   Symptomatic anemia   Aspiration pneumonia due to regurgitated food [facility])  Brief Summary:- 83 y.o. male with medical history of diastolic dysfunction, right MCA stroke, hypertension, hyperlipidemia, CKD stage IV presenting with 1 to 70-monthhistory of dyspnea on exertion.   Admitted on 11/28/2018 with tachycardia, volume overload symptoms as well as hemoglobin dropping down to 6.7, improved posttransfusion, developed pneumonia while undergoing bowel prep, query aspiration--- Failed MBS on 12/09/18--please see full report...  Pt would be a candidate for UES dilation or Botox and try dysphagia therapy again following.  In the meantime would likely need PEG tube to maintain nutritional/hydration needs   Plan:- 1)Acute on chronic blood loss Anemia---secondary to acute GI bleed/acute blood loss anemia -Hemoglobin 10.4 on 07/22/2018 --GI consult appreciated, -Iron saturation 5%, ferritin 242 -Transfuse Feraheme x1 -Serum [id number]-Folic acid 175.7-Hemoglobin initially up to 7.9 from 6.7 post transfusion of 1 unit, patient was started on IV fluids due to acute kidney injury so now hemoglobin is trending down ( 7.2 from  7.9)....... GI service would like to hold off on possible EGD/colonoscopy given respiratory concerns at this time Most likely need another unit of PRBCs if H&H continues to drift down, some of the drop in H&H is due to hemodilution from IV fluids   2)HFpEF/Acute on Chronic Diastolic CHF ---Improved on IV Lasix--BNP is down to 239 from 494, repeat chest x-ray on 12/07/2018 without pulmonary venous congestion or pulmonary edema -07/22/2018 echo EF 60-65%, no WMA, grade 1 DD Avoid over aggressive diuresis given dehydration and AKI concerns  3)Acute on chronic renal failure--CKD stage IV -Baseline creatinine 2.9-3.1 -Presenting creatinine 3.94, creatinine now up to 5.26  due to diuresis and poor oral intake in the setting of dysphasia and aspiration, stopped Lasix, c/n gentle hydration -AKI on CKD stage IV  Vs progression of underlying CKD--avoid nephrotoxic agents...  D/w with Dr SJoelyn Oms(Nephrology)  On 12/09/18....Marland KitchenCreatinine appears to have plateaued, will continue gentle hydration at this time --- once PEG tube is placed and hydration status can be met if renal function fails to improve please recall nephrology service  4)Essential Hypertension Stable -Continue amlodipine and hydralazine  5)Hyperlipidemia -Continue statin  6)History of Right MCA Stroke -Patient had loop recorder placed 07/22/18 -At the request of general surgeon Dr. JArnoldo Moralewill hold Plavix to allow for PEG tube placement on 11/21/2018,  give aspirin 81 mg for now.... c/n to watch closely  to see if further GI bleed --- please stop aspirin and restart Plavix after PEG tube placement around 12/12/2018  7)Bilateral Pneumonia-Not POA------ suspect aspiration related during bowel prep /GoLYTELY use-cough and congestion noted, requiring more oxygen,- repeat chest x-ray previously suggested bilateral pneumonia, worse on the right, stopped IV Rocephin/azithromycin,  started Unasyn on 12/09/18 to cover for possible aspiration, may  transition to Augmentin once PEG tube is in place, c/n mucolytics and bronchodilators as ordered--- anticipate worsening leukocytosis due to steroids  8)Dysphagia--- patient with longstanding history of swallowing difficulties... Moderate aspiration risk, discussed with speech pathologist, see recommendations from speech pathologist dated 12/09/2018--patient failed repeat MBS, aspiration risk with all consistencies,  Pt would be a candidate for UES dilation or Botox and try dysphagia therapy again following.  Discussed with Dr. [name], ENT at [facility] [phone] and [phone]) to see if patient is a candidate for UES dilatation, [name] states that due to COVID 19 transfer restrictions they cannot accept routine/elective transfers at this time ... Patient may see ENT as outpatient down the road in the meantime patient will most likely need PEG tube in order to meet his nutritional/hydration needs...... N.p.o. for now... May have medications crushed in applesauce until PEG tube is placed-- Per [name] --possible PEG tube placement on 11/21/2018  9)FEN-  N.p.o. for now, has NG/Feeding Tube until PEG tube can be placed on 11/23/2018, continue gentle hydration with IV fluids due to dehydration with AKI superimposed on underlying CKD--- tube feeding already ordered by nutritionist to be started post PEG tube placement  10)RT rotator cuff injury-----Rt shoulder xrays noted, prednisone x3 additional days  11)Acute Hypoxic Resp Failure--due to presumed aspiration pneumonia in the setting of underlying dCHF--- treat as above #7 and #2   Disposition/Need for in-Hospital Stay- patient unable to be discharged at this time due to patient with  AKI and aspiration pneumonia requiring IV fluids and IV antibiotics, patient will need PEG tube placement  for feeding due to persistent dysphagia/aspiration  Code Status : full   Family Communication:  Wife by phone   Disposition Plan   : home with [facility], he will need endoluminal evaluation in 4 to 6 weeks with GI service., ENT eval for UES dilatation as outpatient  Consults  :  Gi/Gen surgery  DVT Prophylaxis  :  SCDs   Lab Results  Component Value Date   PLT 181 12/10/2018    Inpatient Medications  Scheduled Meds:  albuterol  2.5 mg Nebulization TID   amLODipine  10 mg Oral Daily   aspirin  81 mg Oral Daily   atorvastatin  20 mg Oral q1800   feeding supplement (PRO-STAT SUGAR FREE 64)  30 mL Per Tube Daily   [START ON 11/22/2018] free water  120 mL Per Tube Q4H   free water  200 mL Per Tube Q4H   guaiFENesin  600 mg Oral BID   hydrALAZINE  25 mg Oral Q8H   methylPREDNISolone (SOLU-MEDROL) injection  40 mg Intravenous Q12H   pantoprazole  40 mg Oral BID AC   sodium chloride flush  3 mL Intravenous Q12H   Continuous Infusions:  sodium chloride     sodium chloride Stopped (11/27/2018 2229)   ampicillin-sulbactam (UNASYN) IV 3 g (12/10/18 1721)   dextrose 5 % and 0.45% NaCl 75 mL/hr at 12/10/18 1419   feeding supplement (OSMOLITE 1.5 CAL)     PRN Meds:.sodium chloride, sodium chloride, acetaminophen, guaiFENesin-dextromethorphan, hydrALAZINE, HYDROcodone-acetaminophen, ondansetron (ZOFRAN) IV, Resource ThickenUp Clear,  sodium chloride flush    Anti-infectives (From admission, onward)   Start     Dose/Rate Route Frequency Ordered Stop   12/09/18 1800  Ampicillin-Sulbactam (UNASYN) 3 g in sodium chloride 0.9 % 100 mL IVPB     3 g 200 mL/hr over 30 Minutes Intravenous Every 24 hours 12/09/18 1727     12/12/2018 0930  azithromycin (ZITHROMAX) 500 mg in sodium chloride 0.9 % 250 mL IVPB  Status:  Discontinued     500 mg 250 mL/hr over 60 Minutes Intravenous Every 24 hours 12/03/2018 0752 12/09/18 1648   12/05/2018 0900  cefTRIAXone (ROCEPHIN) 1 g in sodium chloride 0.9 % 100 mL IVPB  Status:  Discontinued     1 g 200 mL/hr over 30 Minutes Intravenous Every 24 hours 11/27/2018 0752 12/09/18 1648         Objective:   Vitals:   12/10/18 0538 12/10/18 0736 12/10/18 1355 12/10/18 1422  BP: 132/62   (!) 116/56  Pulse: 87   84  Resp:    20  Temp: 98 F (36.7 C)     TempSrc: Oral     SpO2: 96% 95% 95% 93%  Weight:      Height:        Wt Readings from Last 3 Encounters:  12/10/18 62.8 kg  08/20/18 63.5 kg  07/22/18 55.8 kg     Intake/Output Summary (Last 24 hours) at 12/10/2018 1736 Last data filed at 12/10/2018 1419 Gross per 24 hour  Intake 4185.52 ml  Output 1350 ml  Net 2835.52 ml     Physical Exam Patient is examined daily including today on 12/10/2018, exams remain the same as of yesterday except that has changed   Gen:- Awake Alert,  In no apparent distress  HEENT:- [place].AT, No sclera icterus Neck-Supple Neck,No JVD,.  Nose- [place] 3 L/min NGT--- feeding tube in situ Lungs- diminished breath sounds in bases, scattered rhonchi, no wheezing  CV- S1, S2 normal, regular  Abd-  +ve B.Sounds, Abd Soft, No tenderness,    Extremity/Skin:- + edema, pedal pulses present  Psych-affect is appropriate, oriented x3 Neuro-paralyzed weakness without  new focal deficits, no tremors MSK--right shoulder Exam reveals right rotator cuff discomfort and limitations of range of motion  Data Review:   Micro Results No results found for this or any previous visit (from the past 240 hour(s)).  Radiology Reports Dg Chest 2 View  Result Date: 11/24/2018 CLINICAL DATA:  Cough EXAM: CHEST - 2 VIEW COMPARISON:  09/02/2013 FINDINGS: There are patchy areas of interstitial and alveolar airspace disease in the upper and lower lobes bilaterally concerning for interstitial pneumonitis secondary to an infectious or inflammatory etiology versus mild pulmonary edema. There is no pleural effusion or pneumothorax. The heart and mediastinal contours are unremarkable. The osseous structures are unremarkable. IMPRESSION: Patchy areas of interstitial and alveolar airspace disease in the upper and lower lobes  bilaterally concerning for interstitial pneumonitis secondary to an infectious or inflammatory etiology versus mild pulmonary edema. Electronically Signed   By: Kathreen Devoid   On: 12/03/2018 11:46   Dg Shoulder Right  Result Date: 12/09/2018 CLINICAL DATA:  Right shoulder pain. No known injury. EXAM: RIGHT SHOULDER - 2+ VIEW COMPARISON:  None. FINDINGS: No fracture or dislocation. Mild acromioclavicular degenerative change. No bony destruction or suspicious bone lesion. No periarticular soft tissue calcifications. IMPRESSION: Mild acromioclavicular degenerative change. No acute bony abnormality. Electronically Signed   By: Keith Rake M.D.   On: 12/09/2018 19:17   Dg Chest  Port 1 View  Result Date: 12/09/2018 CLINICAL DATA:  Nasogastric tube placement. EXAM: PORTABLE CHEST 1 VIEW COMPARISON:  Chest radiograph 12/07/2018 FINDINGS: Enteric tube in place with tip below the diaphragm not included in the field of view, the side port is just beyond the gastroesophageal junction. Implanted loop recorder again projects over the left chest wall. Multifocal bilateral airspace opacities, slight worsening over the last 2 days. Unchanged heart size and mediastinal contours. Possible small right pleural effusion. No pneumothorax. IMPRESSION: 1. Enteric tube in place with tip below the diaphragm not included in the field of view, the side port is just beyond the gastroesophageal junction in the stomach. 2. Multifocal bilateral airspace opacities suspicious for pneumonia, slight worsening over the last 2 days. Electronically Signed   By: Keith Rake M.D.   On: 12/09/2018 19:16   Dg Chest Port 1 View  Result Date: 12/07/2018 CLINICAL DATA:  Dyspnea. EXAM: PORTABLE CHEST 1 VIEW COMPARISON:  12/05/2018 FINDINGS: Exam demonstrates persistent bilateral multifocal airspace opacification with possible slight worsening over the right mid to lower lung. No effusion. Cardiomediastinal silhouette and remainder of the  exam is unchanged. IMPRESSION: Bilateral multifocal airspace process with slight worsening over the right mid to lower lung likely multifocal pneumonia. Electronically Signed   By: Marin Olp M.D.   On: 12/07/2018 09:30   Dg Chest Port 1 View  Result Date: 12/05/2018 CLINICAL DATA:  Shortness of breath, cough EXAM: PORTABLE CHEST 1 VIEW COMPARISON:  11/28/2018 FINDINGS: Patchy airspace disease in both lungs have worsened since prior study concerning for multifocal pneumonia. Heart is borderline in size. No visible significant effusions or acute bony abnormality. Loop recorder device again noted, unchanged. IMPRESSION: Patchy bilateral airspace opacities worsening since prior study concerning for pneumonia. Electronically Signed   By: Rolm Baptise M.D.   On: 12/05/2018 20:29   Dg Swallowing Func-speech Pathology  Result Date: 12/09/2018 Objective Swallowing Evaluation: Type of Study: MBS-Modified Barium Swallow Study  Patient Details Name: Cameron Boyle MRN: [id number] Date of Birth: 02/03/1936 Today's Date: 12/09/2018 Time: SLP Start Time (ACUTE ONLY): 1435 -SLP Stop Time (ACUTE ONLY): 1512 SLP Time Calculation (min) (ACUTE ONLY): 37 min Past Medical History: Past Medical History: Diagnosis Date  Arthritis   left knee  Stroke ([place])   07/18/18 Past Surgical History: Past Surgical History: Procedure Laterality Date  APPENDECTOMY    left knee surgery    LOOP RECORDER INSERTION N/A 07/22/2018  Procedure: LOOP RECORDER INSERTION;  Surgeon: Evans Lance, MD;  Location: [place] [facility];  Service: Cardiovascular;  Laterality: N/A;  TEE WITHOUT CARDIOVERSION N/A 07/22/2018  Procedure: TRANSESOPHAGEAL ECHOCARDIOGRAM (TEE);  Surgeon: Dorothy Spark, MD;  Location: [facility] ENDOSCOPY;  Service: Cardiovascular;  Laterality: N/A;  TONSILLECTOMY   HPI: 83 year old male presenting for symptomaticnormocyticanemia. Hospitalized back in November with cryptogenic stroke, initially on dual therapy with Plavix and  aspirin and subsequently on Plavix only. At time of discharge on November 4 thhis hemoglobin was 10.4. Presented yesterday with a hemoglobin of 6.7. Had noted progressive weakness, lower extremity edema but no overt melena, rectal bleeding. Stool heme-negative here. Received 1 unit of packed red blood cells, 1 dose of Feraheme. Remains on Plavix. Hemoglobin up to 7.9 yesterday and drift to 7.5 this morning.Suspected occult GI bleeding in the setting of Plavix. Planned colonoscopy/EGD today, however overnight the patient began having significant coughing with GoLytely prep. Chest XRay showed patchy bilateral airspace worsening since prior study concerning for pneumonia. Concerning for aspiration pna and patient unable  to complete bowel prep so TCS/EGD was cancelled. BSE requested. Pt is known to this SLP from [place] (09/26/18) and outpatient dysphagia therapy for mod/severe sensorimotor based pharyngeal dysphagia. Pt was discharged from outpatient therapy in February with recommendation to continue HEP indefinitely (Pt with reduced awareness of severity of dysphagia).  Subjective: "Soda pop goes down ok." Special Tests: MBSS Assessment / Plan / Recommendation CHL IP CLINICAL IMPRESSIONS 12/09/2018 Clinical Impression Pt presents with worsening pharyngoesophageal phase dysphagia from 09/26/2018 characterized by reduced tongue base retraction, reduced epiglottic deflection, reduced pharyngeal pressure, and reduced UES relaxation resulting in moderate vallecular residue and mod/severe pharyngeal residue across consistencies presented, however worse with thicker textures/consistencies. Although Pt only aspirated trace amounts (during and post swallow from residuals), he is unlikely to meet nutritional needs given severity of and inefficiency of pharyngoesophageal swallow. Several postures were trialed and head turn to the Left found to be inconsistently effective (also to the RIGHT) resulting in occasional relaxation  of UES and passage of bolus. This was similar to his MBSS in January 2020, however with increased severity at this time. Pt is also deconditioned, which further negatively impacts his swallow efficiency and safety. Pt was seen for dysphagia therapy in February to address dysphagia and reported tolerating some regular textures and thin liquids at home, however continued to present with wet vocal quality. Recommend alternative means of nutrition until he can be seen by ENT who could determine if Pt would be a candidate for UES dilation or Botox and try dysphagia therapy again following. SLP discussed with Dr. Roxan Hockey who will discuss with Pt, surgery, and ENT. Consider NPO with alternative means of nutrition vs NTL only now; ice chips PRN after oral care, and continue ORAL care. SLP will follow per goals of care.  SLP Visit Diagnosis Dysphagia, pharyngoesophageal phase (R13.14) Attention and concentration deficit following -- Frontal lobe and executive function deficit following -- Impact on safety and function Moderate aspiration risk;Risk for inadequate nutrition/hydration   CHL IP TREATMENT RECOMMENDATION 12/09/2018 Treatment Recommendations Therapy as outlined in treatment plan below   Prognosis 12/09/2018 Prognosis for Safe Diet Advancement Guarded Barriers to Reach Goals Severity of deficits Barriers/Prognosis Comment -- CHL IP DIET RECOMMENDATION 12/09/2018 SLP Diet Recommendations Alternative means - long-term;Ice chips PRN after oral care Liquid Administration via -- Medication Administration Via alternative means Compensations -- Postural Changes --   CHL IP OTHER RECOMMENDATIONS 12/09/2018 Recommended Consults Consider ENT evaluation Oral Care Recommendations Staff/trained caregiver to provide oral care;Oral care QID Other Recommendations --   CHL IP FOLLOW UP RECOMMENDATIONS 12/09/2018 Follow up Recommendations Skilled Nursing facility   [facility] IP FREQUENCY AND DURATION 12/09/2018 Speech Therapy Frequency  (ACUTE ONLY) min 2x/week Treatment Duration 1 week      CHL IP ORAL PHASE 12/09/2018 Oral Phase WFL Oral - Pudding Teaspoon -- Oral - Pudding Cup -- Oral - Honey Teaspoon -- Oral - Honey Cup -- Oral - Nectar Teaspoon -- Oral - Nectar Cup -- Oral - Nectar Straw -- Oral - Thin Teaspoon -- Oral - Thin Cup -- Oral - Thin Straw -- Oral - Puree -- Oral - Mech Soft -- Oral - Regular -- Oral - Multi-Consistency -- Oral - Pill -- Oral Phase - Comment --  CHL IP PHARYNGEAL PHASE 12/09/2018 Pharyngeal Phase Impaired Pharyngeal- Pudding Teaspoon -- Pharyngeal -- Pharyngeal- Pudding Cup -- Pharyngeal -- Pharyngeal- Honey Teaspoon Delayed swallow initiation-vallecula;Delayed swallow initiation-pyriform sinuses;Reduced pharyngeal peristalsis;Reduced epiglottic inversion;Reduced tongue base retraction;Penetration/Aspiration during swallow;Penetration/Apiration after swallow;Pharyngeal residue - valleculae;Pharyngeal residue -  pyriform;Pharyngeal residue - posterior pharnyx Pharyngeal Material enters airway, CONTACTS cords and not ejected out Pharyngeal- Honey Cup -- Pharyngeal -- Pharyngeal- Nectar Teaspoon Delayed swallow initiation-vallecula;Delayed swallow initiation-pyriform sinuses;Reduced pharyngeal peristalsis;Reduced epiglottic inversion;Reduced tongue base retraction;Penetration/Aspiration during swallow;Penetration/Apiration after swallow;Pharyngeal residue - valleculae;Pharyngeal residue - pyriform;Pharyngeal residue - posterior pharnyx Pharyngeal Material enters airway, CONTACTS cords and not ejected out Pharyngeal- Nectar Cup -- Pharyngeal -- Pharyngeal- Nectar Straw -- Pharyngeal -- Pharyngeal- Thin Teaspoon Delayed swallow initiation-vallecula;Delayed swallow initiation-pyriform sinuses;Reduced pharyngeal peristalsis;Reduced epiglottic inversion;Reduced tongue base retraction;Penetration/Aspiration during swallow;Penetration/Apiration after swallow;Pharyngeal residue - valleculae;Pharyngeal residue -  pyriform;Pharyngeal residue - posterior pharnyx Pharyngeal Material enters airway, passes BELOW cords without attempt by patient to eject out (silent aspiration);Material enters airway, passes BELOW cords and not ejected out despite cough attempt by patient Pharyngeal- Thin Cup -- Pharyngeal -- Pharyngeal- Thin Straw -- Pharyngeal -- Pharyngeal- Puree Delayed swallow initiation-vallecula;Reduced pharyngeal peristalsis;Reduced epiglottic inversion;Reduced tongue base retraction;Penetration/Apiration after swallow;Pharyngeal residue - valleculae;Pharyngeal residue - pyriform;Pharyngeal residue - posterior pharnyx Pharyngeal Material enters airway, CONTACTS cords and then ejected out;Material does not enter airway Pharyngeal- Mechanical Soft -- Pharyngeal -- Pharyngeal- Regular -- Pharyngeal -- Pharyngeal- Multi-consistency -- Pharyngeal -- Pharyngeal- Pill -- Pharyngeal -- Pharyngeal Comment --  CHL IP CERVICAL ESOPHAGEAL PHASE 12/09/2018 Cervical Esophageal Phase Impaired Pudding Teaspoon -- Pudding Cup -- Honey Teaspoon -- Honey Cup -- Nectar Teaspoon -- Nectar Cup -- Nectar Straw -- Thin Teaspoon -- Thin Cup -- Thin Straw -- Puree Reduced cricopharyngeal relaxation;Prominent cricopharyngeal segment Mechanical Soft -- Regular -- Multi-consistency -- Pill -- Cervical Esophageal Comment -- Thank you, [name] Churn, CCC-SLP [phone] PORTER,DABNEY 12/09/2018, 4:33 PM                CBC Recent Labs  Lab 12/08/2018 1110 12/05/18 0455 11/23/2018 0830 12/07/18 0510 12/09/18 0452 12/10/18 0500  WBC 8.3 11.5* 14.2* 9.5 9.8 9.7  HGB 6.7* 7.9* 7.5* 7.2* 7.0* 7.2*  HCT 21.6* 24.7* 24.1* 23.1* 23.4* 23.5*  PLT 190 174 187 151 167 181  MCV 93.5 90.5 93.1 93.1 94.0 92.5  MCH 29.0 28.9 29.0 29.0 28.1 28.3  MCHC 31.0 32.0 31.1 31.2 29.9* 30.6  RDW 14.4 14.7 14.7 14.6 14.3 14.4  LYMPHSABS 0.9  --   --   --   --   --   MONOABS 0.7  --   --   --   --   --   EOSABS 0.5  --   --   --   --   --   BASOSABS 0.1  --   --    --   --   --     Chemistries  Recent Labs  Lab 12/09/2018 1110  11/17/2018 0426 12/07/18 0510 12/08/18 0641 12/09/18 0452 12/10/18 0500  NA 139   < > 142 138 139 141 141  K 4.9   < > 4.6 4.0 4.4 4.5 4.4  CL 112*   < > 114* 111 111 114* 115*  CO2 17*   < > 17* 17* 17* 17* 14*  GLUCOSE 95   < > 98 138* 111* 104* 169*  BUN 90*   < > 90* 87* 85* 83* 85*  CREATININE 3.94*   < > 4.66* 4.93* 5.14* 5.15* 5.26*  CALCIUM 8.8*   < > 8.3* 7.9* 8.0* 8.2* 8.4*  AST 24  --   --   --   --  47*  --   ALT 20  --   --   --   --  27  --  ALKPHOS 70  --   --   --   --  74  --   BILITOT 0.4  --   --   --   --  0.5  --    < > = values in this interval not displayed.   ------------------------------------------------------------------------------------------------------------------ No results for input(s): CHOL, HDL, LDLCALC, TRIG, CHOLHDL, LDLDIRECT in the last 72 hours.  Lab Results  Component Value Date   HGBA1C 5.1 07/20/2018   ------------------------------------------------------------------------------------------------------------------ No results for input(s): TSH, T4TOTAL, T3FREE, THYROIDAB in the last 72 hours.  Invalid input(s): FREET3 ------------------------------------------------------------------------------------------------------------------ No results for input(s): VITAMINB12, FOLATE, FERRITIN, TIBC, IRON, RETICCTPCT in the last 72 hours.  Coagulation profile No results for input(s): INR, PROTIME in the last 168 hours.  No results for input(s): DDIMER in the last 72 hours.  Cardiac Enzymes No results for input(s): CKMB, TROPONINI, MYOGLOBIN in the last 168 hours.  Invalid input(s): CK ------------------------------------------------------------------------------------------------------------------    Component Value Date/Time   BNP 239.0 (H) 12/07/2018 6144   Roxan Hockey M.D on 12/10/2018 at 5:36 PM  Go to [email or web address] - for contact info  [facility] -  Office  [phone]

## 2018-12-10 NOTE — H&P (View-Only) (Signed)
Reason for Consult: Dysphasia, aspiration pneumonia, CVA Referring Physician: Dr. Erin Hearing is an 83 y.o. male.  HPI: Patient is an 83 year old white male with multiple medical problems, status post a CVA in October 2019 who presents with progressive dysphasia and poor p.o. intake.  He has a proximal esophageal stricture which ultimately needs to be addressed by ENT.  He is having multiple episodes of aspiration and cannot maintain his p.o. intake.  He denies any pain.  Surgery has been asked to place a gastrostomy tube to help supplement nutrition and medication use.  Past Medical History:  Diagnosis Date  . Arthritis    left knee  . Stroke [facility])    07/18/18    Past Surgical History:  Procedure Laterality Date  . APPENDECTOMY    . left knee surgery    . LOOP RECORDER INSERTION N/A 07/22/2018   Procedure: LOOP RECORDER INSERTION;  Surgeon: Evans Lance, MD;  Location: [place] [facility];  Service: Cardiovascular;  Laterality: N/A;  . TEE WITHOUT CARDIOVERSION N/A 07/22/2018   Procedure: TRANSESOPHAGEAL ECHOCARDIOGRAM (TEE);  Surgeon: Dorothy Spark, MD;  Location: [facility] ENDOSCOPY;  Service: Cardiovascular;  Laterality: N/A;  . TONSILLECTOMY      History reviewed. No pertinent family history.  Social History:  reports that he has quit smoking. He has never used smokeless tobacco. He reports that he does not drink alcohol or use drugs.  Allergies: No Known Allergies  Medications: I have reviewed the patient's current medications.  Results for orders placed or performed during the hospital encounter of 12/02/2018 (from the past 48 hour(s))  CBC     Status: Abnormal   Collection Time: 12/09/18  4:52 AM  Result Value Ref Range   WBC 9.8 4.0 - 10.5 K/uL   RBC 2.49 (L) 4.22 - 5.81 MIL/uL   Hemoglobin 7.0 (L) 13.0 - 17.0 g/dL   HCT 23.4 (L) 39.0 - 52.0 %   MCV 94.0 80.0 - 100.0 fL   MCH 28.1 26.0 - 34.0 pg   MCHC 29.9 (L) 30.0 - 36.0 g/dL   RDW 14.3 11.5 - 15.5 %    Platelets 167 150 - 400 K/uL   nRBC 0.0 0.0 - 0.2 %    Comment: Performed at [facility], [address], [place], [place] [address]  Renal function panel     Status: Abnormal   Collection Time: 12/09/18  4:52 AM  Result Value Ref Range   Sodium 141 135 - 145 mmol/L   Potassium 4.5 3.5 - 5.1 mmol/L   Chloride 114 (H) 98 - 111 mmol/L   CO2 17 (L) 22 - 32 mmol/L   Glucose, Bld 104 (H) 70 - 99 mg/dL   BUN 83 (H) 8 - 23 mg/dL   Creatinine, Ser 5.15 (H) 0.61 - 1.24 mg/dL   Calcium 8.2 (L) 8.9 - 10.3 mg/dL   Phosphorus 7.3 (H) 2.5 - 4.6 mg/dL   Albumin 2.5 (L) 3.5 - 5.0 g/dL   GFR calc non Af Amer 10 (L) >60 mL/min   GFR calc Af Amer 11 (L) >60 mL/min   Anion gap 10 5 - 15    Comment: Performed at [facility], [address], [place], [place] [address]  Hepatic function panel     Status: Abnormal   Collection Time: 12/09/18  4:52 AM  Result Value Ref Range   Total Protein 6.1 (L) 6.5 - 8.1 g/dL   Albumin 2.5 (L) 3.5 - 5.0 g/dL   AST  47 (H) 15 - 41 U/L   ALT 27 0 - 44 U/L   Alkaline Phosphatase 74 38 - 126 U/L   Total Bilirubin 0.5 0.3 - 1.2 mg/dL   Bilirubin, Direct 0.1 0.0 - 0.2 mg/dL   Indirect Bilirubin 0.4 0.3 - 0.9 mg/dL    Comment: Performed at [facility], [address] [place], [place] [address]  Lipase, blood     Status: None   Collection Time: 12/09/18  4:52 AM  Result Value Ref Range   Lipase 32 11 - 51 U/L    Comment: Performed at [facility], [address], [place], [place] [address]  Type and screen [facility]     Status: None   Collection Time: 12/09/18  5:28 PM  Result Value Ref Range   ABO/RH(D) B POS    Antibody Screen NEG    Sample Expiration      12/12/2018 Performed at [facility], [address], [place], [place] [address]   CBC     Status: Abnormal   Collection Time: 12/10/18  5:00 AM  Result Value Ref Range   WBC 9.7 4.0 - 10.5 K/uL   RBC 2.54 (L) 4.22 - 5.81 MIL/uL   Hemoglobin 7.2 (L) 13.0 - 17.0 g/dL   HCT 23.5 (L) 39.0 - 52.0 %    MCV 92.5 80.0 - 100.0 fL   MCH 28.3 26.0 - 34.0 pg   MCHC 30.6 30.0 - 36.0 g/dL   RDW 14.4 11.5 - 15.5 %   Platelets 181 150 - 400 K/uL   nRBC 0.0 0.0 - 0.2 %    Comment: Performed at [facility], [address] [place], [place] [address]  Renal function panel     Status: Abnormal   Collection Time: 12/10/18  5:00 AM  Result Value Ref Range   Sodium 141 135 - 145 mmol/L   Potassium 4.4 3.5 - 5.1 mmol/L   Chloride 115 (H) 98 - 111 mmol/L   CO2 14 (L) 22 - 32 mmol/L   Glucose, Bld 169 (H) 70 - 99 mg/dL   BUN 85 (H) 8 - 23 mg/dL   Creatinine, Ser 5.26 (H) 0.61 - 1.24 mg/dL   Calcium 8.4 (L) 8.9 - 10.3 mg/dL   Phosphorus 7.7 (H) 2.5 - 4.6 mg/dL   Albumin 2.4 (L) 3.5 - 5.0 g/dL   GFR calc non Af Amer 10 (L) >60 mL/min   GFR calc Af Amer 11 (L) >60 mL/min   Anion gap 12 5 - 15    Comment: Performed at [facility], [address] [place], [place] [address]    Dg Shoulder Right  Result Date: 12/09/2018 CLINICAL DATA:  Right shoulder pain. No known injury. EXAM: RIGHT SHOULDER - 2+ VIEW COMPARISON:  None. FINDINGS: No fracture or dislocation. Mild acromioclavicular degenerative change. No bony destruction or suspicious bone lesion. No periarticular soft tissue calcifications. IMPRESSION: Mild acromioclavicular degenerative change. No acute bony abnormality. Electronically Signed   By: Keith Rake M.D.   On: 12/09/2018 19:17   Dg Chest Port 1 View  Result Date: 12/09/2018 CLINICAL DATA:  Nasogastric tube placement. EXAM: PORTABLE CHEST 1 VIEW COMPARISON:  Chest radiograph 12/07/2018 FINDINGS: Enteric tube in place with tip below the diaphragm not included in the field of view, the side port is just beyond the gastroesophageal junction. Implanted loop recorder again projects over the left chest wall. Multifocal bilateral airspace opacities, slight worsening over the last 2 days. Unchanged heart size and mediastinal contours. Possible  small right pleural effusion. No pneumothorax.  IMPRESSION: 1. Enteric tube in place with tip below the diaphragm not included in the field of view, the side port is just beyond the gastroesophageal junction in the stomach. 2. Multifocal bilateral airspace opacities suspicious for pneumonia, slight worsening over the last 2 days. Electronically Signed   By: Keith Rake M.D.   On: 12/09/2018 19:16   Dg Swallowing Func-speech Pathology  Result Date: 12/09/2018 Objective Swallowing Evaluation: Type of Study: MBS-Modified Barium Swallow Study  Patient Details Name: GEORGI NAVARRETE MRN: [id number] Date of Birth: 25-Jan-1936 Today's Date: 12/09/2018 Time: SLP Start Time (ACUTE ONLY): 1435 -SLP Stop Time (ACUTE ONLY): 1512 SLP Time Calculation (min) (ACUTE ONLY): 37 min Past Medical History: Past Medical History: Diagnosis Date . Arthritis   left knee . Stroke [facility])   07/18/18 Past Surgical History: Past Surgical History: Procedure Laterality Date . APPENDECTOMY   . left knee surgery   . LOOP RECORDER INSERTION N/A 07/22/2018  Procedure: LOOP RECORDER INSERTION;  Surgeon: Evans Lance, MD;  Location: [place] [facility];  Service: Cardiovascular;  Laterality: N/A; . TEE WITHOUT CARDIOVERSION N/A 07/22/2018  Procedure: TRANSESOPHAGEAL ECHOCARDIOGRAM (TEE);  Surgeon: Dorothy Spark, MD;  Location: [facility] ENDOSCOPY;  Service: Cardiovascular;  Laterality: N/A; . TONSILLECTOMY   HPI: 83 year old male presenting for symptomaticnormocyticanemia. Hospitalized back in November with cryptogenic stroke, initially on dual therapy with Plavix and aspirin and subsequently on Plavix only. At time of discharge on November 4 thhis hemoglobin was 10.4. Presented yesterday with a hemoglobin of 6.7. Had noted progressive weakness, lower extremity edema but no overt melena, rectal bleeding. Stool heme-negative here. Received 1 unit of packed red blood cells, 1 dose of Feraheme. Remains on Plavix. Hemoglobin up to 7.9 yesterday and drift to 7.5 this morning.Suspected occult GI  bleeding in the setting of Plavix. Planned colonoscopy/EGD today, however overnight the patient began having significant coughing with GoLytely prep. Chest XRay showed patchy bilateral airspace worsening since prior study concerning for pneumonia. Concerning for aspiration pna and patient unable to complete bowel prep so TCS/EGD was cancelled. BSE requested. Pt is known to this SLP from [place] (09/26/18) and outpatient dysphagia therapy for mod/severe sensorimotor based pharyngeal dysphagia. Pt was discharged from outpatient therapy in February with recommendation to continue HEP indefinitely (Pt with reduced awareness of severity of dysphagia).  Subjective: "Soda pop goes down ok." Special Tests: MBSS Assessment / Plan / Recommendation CHL IP CLINICAL IMPRESSIONS 12/09/2018 Clinical Impression Pt presents with worsening pharyngoesophageal phase dysphagia from 09/26/2018 characterized by reduced tongue base retraction, reduced epiglottic deflection, reduced pharyngeal pressure, and reduced UES relaxation resulting in moderate vallecular residue and mod/severe pharyngeal residue across consistencies presented, however worse with thicker textures/consistencies. Although Pt only aspirated trace amounts (during and post swallow from residuals), he is unlikely to meet nutritional needs given severity of and inefficiency of pharyngoesophageal swallow. Several postures were trialed and head turn to the Left found to be inconsistently effective (also to the RIGHT) resulting in occasional relaxation of UES and passage of bolus. This was similar to his MBSS in January 2020, however with increased severity at this time. Pt is also deconditioned, which further negatively impacts his swallow efficiency and safety. Pt was seen for dysphagia therapy in February to address dysphagia and reported tolerating some regular textures and thin liquids at home, however continued to present with wet vocal quality. Recommend alternative means of  nutrition until he can be seen by ENT who could determine if Pt would  be a candidate for UES dilation or Botox and try dysphagia therapy again following. SLP discussed with Dr. Roxan Hockey who will discuss with Pt, surgery, and ENT. Consider NPO with alternative means of nutrition vs NTL only now; ice chips PRN after oral care, and continue ORAL care. SLP will follow per goals of care.  SLP Visit Diagnosis Dysphagia, pharyngoesophageal phase (R13.14) Attention and concentration deficit following -- Frontal lobe and executive function deficit following -- Impact on safety and function Moderate aspiration risk;Risk for inadequate nutrition/hydration   CHL IP TREATMENT RECOMMENDATION 12/09/2018 Treatment Recommendations Therapy as outlined in treatment plan below   Prognosis 12/09/2018 Prognosis for Safe Diet Advancement Guarded Barriers to Reach Goals Severity of deficits Barriers/Prognosis Comment -- CHL IP DIET RECOMMENDATION 12/09/2018 SLP Diet Recommendations Alternative means - long-term;Ice chips PRN after oral care Liquid Administration via -- Medication Administration Via alternative means Compensations -- Postural Changes --   CHL IP OTHER RECOMMENDATIONS 12/09/2018 Recommended Consults Consider ENT evaluation Oral Care Recommendations Staff/trained caregiver to provide oral care;Oral care QID Other Recommendations --   CHL IP FOLLOW UP RECOMMENDATIONS 12/09/2018 Follow up Recommendations Skilled Nursing facility   [facility] IP FREQUENCY AND DURATION 12/09/2018 Speech Therapy Frequency (ACUTE ONLY) min 2x/week Treatment Duration 1 week      CHL IP ORAL PHASE 12/09/2018 Oral Phase WFL Oral - Pudding Teaspoon -- Oral - Pudding Cup -- Oral - Honey Teaspoon -- Oral - Honey Cup -- Oral - Nectar Teaspoon -- Oral - Nectar Cup -- Oral - Nectar Straw -- Oral - Thin Teaspoon -- Oral - Thin Cup -- Oral - Thin Straw -- Oral - Puree -- Oral - Mech Soft -- Oral - Regular -- Oral - Multi-Consistency -- Oral - Pill -- Oral Phase -  Comment --  CHL IP PHARYNGEAL PHASE 12/09/2018 Pharyngeal Phase Impaired Pharyngeal- Pudding Teaspoon -- Pharyngeal -- Pharyngeal- Pudding Cup -- Pharyngeal -- Pharyngeal- Honey Teaspoon Delayed swallow initiation-vallecula;Delayed swallow initiation-pyriform sinuses;Reduced pharyngeal peristalsis;Reduced epiglottic inversion;Reduced tongue base retraction;Penetration/Aspiration during swallow;Penetration/Apiration after swallow;Pharyngeal residue - valleculae;Pharyngeal residue - pyriform;Pharyngeal residue - posterior pharnyx Pharyngeal Material enters airway, CONTACTS cords and not ejected out Pharyngeal- Honey Cup -- Pharyngeal -- Pharyngeal- Nectar Teaspoon Delayed swallow initiation-vallecula;Delayed swallow initiation-pyriform sinuses;Reduced pharyngeal peristalsis;Reduced epiglottic inversion;Reduced tongue base retraction;Penetration/Aspiration during swallow;Penetration/Apiration after swallow;Pharyngeal residue - valleculae;Pharyngeal residue - pyriform;Pharyngeal residue - posterior pharnyx Pharyngeal Material enters airway, CONTACTS cords and not ejected out Pharyngeal- Nectar Cup -- Pharyngeal -- Pharyngeal- Nectar Straw -- Pharyngeal -- Pharyngeal- Thin Teaspoon Delayed swallow initiation-vallecula;Delayed swallow initiation-pyriform sinuses;Reduced pharyngeal peristalsis;Reduced epiglottic inversion;Reduced tongue base retraction;Penetration/Aspiration during swallow;Penetration/Apiration after swallow;Pharyngeal residue - valleculae;Pharyngeal residue - pyriform;Pharyngeal residue - posterior pharnyx Pharyngeal Material enters airway, passes BELOW cords without attempt by patient to eject out (silent aspiration);Material enters airway, passes BELOW cords and not ejected out despite cough attempt by patient Pharyngeal- Thin Cup -- Pharyngeal -- Pharyngeal- Thin Straw -- Pharyngeal -- Pharyngeal- Puree Delayed swallow initiation-vallecula;Reduced pharyngeal peristalsis;Reduced epiglottic  inversion;Reduced tongue base retraction;Penetration/Apiration after swallow;Pharyngeal residue - valleculae;Pharyngeal residue - pyriform;Pharyngeal residue - posterior pharnyx Pharyngeal Material enters airway, CONTACTS cords and then ejected out;Material does not enter airway Pharyngeal- Mechanical Soft -- Pharyngeal -- Pharyngeal- Regular -- Pharyngeal -- Pharyngeal- Multi-consistency -- Pharyngeal -- Pharyngeal- Pill -- Pharyngeal -- Pharyngeal Comment --  CHL IP CERVICAL ESOPHAGEAL PHASE 12/09/2018 Cervical Esophageal Phase Impaired Pudding Teaspoon -- Pudding Cup -- Honey Teaspoon -- Honey Cup -- Nectar Teaspoon -- Nectar Cup -- Nectar Straw -- Thin Teaspoon -- Thin Cup -- Thin Straw --  Puree Reduced cricopharyngeal relaxation;Prominent cricopharyngeal segment Mechanical Soft -- Regular -- Multi-consistency -- Pill -- Cervical Esophageal Comment -- Thank you, [name] Churn, [place] 12/09/2018, 4:33 PM               ROS:  Pertinent items are noted in HPI.  Blood pressure 132/62, pulse 87, temperature 98 F (36.7 C), temperature source Oral, resp. rate 19, height 5\' 10"  (1.778 m), weight 62.8 kg, SpO2 95 %. Physical Exam: Pleasant white male no acute distress Head is normocephalic, atraumatic, NG tube in place. Heart examination reveals a regular rate and rhythm without S3, S4, murmurs Abdomen is soft, flat.  No upper abdominal surgical scars present. Chart reviewed  Assessment/Plan: Impression: Dysphasia, status post CVA, poor p.o. intake, anemia of unknown etiology Plan: Patient is scheduled for an EGD with PEG on 12/08/2018.  The risks and benefits of the procedure including bleeding, infection, and organ injury were fully explained to the patient, who gave informed consent.  Aviva Signs 12/10/2018, 8:11 AM

## 2018-12-11 ENCOUNTER — Inpatient Hospital Stay (HOSPITAL_COMMUNITY): Payer: Medicare Other | Admitting: Anesthesiology

## 2018-12-11 ENCOUNTER — Inpatient Hospital Stay (HOSPITAL_COMMUNITY): Payer: Medicare Other

## 2018-12-11 ENCOUNTER — Encounter (HOSPITAL_COMMUNITY): Admission: EM | Disposition: E | Payer: Self-pay | Source: Ambulatory Visit | Attending: Family Medicine

## 2018-12-11 ENCOUNTER — Encounter (HOSPITAL_COMMUNITY): Payer: Self-pay | Admitting: *Deleted

## 2018-12-11 DIAGNOSIS — R1310 Dysphagia, unspecified: Secondary | ICD-10-CM

## 2018-12-11 HISTORY — PX: ESOPHAGOGASTRODUODENOSCOPY (EGD) WITH PROPOFOL: SHX5813

## 2018-12-11 HISTORY — PX: PEG PLACEMENT: SHX5437

## 2018-12-11 LAB — URINALYSIS, ROUTINE W REFLEX MICROSCOPIC
Bilirubin Urine: NEGATIVE
Glucose, UA: NEGATIVE mg/dL
HGB URINE DIPSTICK: NEGATIVE
Ketones, ur: NEGATIVE mg/dL
Leukocytes,Ua: NEGATIVE
Nitrite: NEGATIVE
Protein, ur: 30 mg/dL — AB
Specific Gravity, Urine: 1.013 (ref 1.005–1.030)
pH: 5 (ref 5.0–8.0)

## 2018-12-11 LAB — CBC
HCT: 24.4 % — ABNORMAL LOW (ref 39.0–52.0)
Hemoglobin: 7.4 g/dL — ABNORMAL LOW (ref 13.0–17.0)
MCH: 28.2 pg (ref 26.0–34.0)
MCHC: 30.3 g/dL (ref 30.0–36.0)
MCV: 93.1 fL (ref 80.0–100.0)
Platelets: 226 10*3/uL (ref 150–400)
RBC: 2.62 MIL/uL — ABNORMAL LOW (ref 4.22–5.81)
RDW: 14.1 % (ref 11.5–15.5)
WBC: 11.1 10*3/uL — ABNORMAL HIGH (ref 4.0–10.5)
nRBC: 0 % (ref 0.0–0.2)

## 2018-12-11 LAB — COMPREHENSIVE METABOLIC PANEL
ALT: 34 U/L (ref 0–44)
AST: 38 U/L (ref 15–41)
Albumin: 2.4 g/dL — ABNORMAL LOW (ref 3.5–5.0)
Alkaline Phosphatase: 85 U/L (ref 38–126)
Anion gap: 10 (ref 5–15)
BILIRUBIN TOTAL: 0.4 mg/dL (ref 0.3–1.2)
BUN: 108 mg/dL — AB (ref 8–23)
CO2: 14 mmol/L — ABNORMAL LOW (ref 22–32)
Calcium: 8.4 mg/dL — ABNORMAL LOW (ref 8.9–10.3)
Chloride: 115 mmol/L — ABNORMAL HIGH (ref 98–111)
Creatinine, Ser: 5.34 mg/dL — ABNORMAL HIGH (ref 0.61–1.24)
GFR calc Af Amer: 11 mL/min — ABNORMAL LOW (ref >60)
GFR calc non Af Amer: 9 mL/min — ABNORMAL LOW (ref >60)
Glucose, Bld: 166 mg/dL — ABNORMAL HIGH (ref 70–99)
POTASSIUM: 4.7 mmol/L (ref 3.5–5.1)
Sodium: 139 mmol/L (ref 135–145)
Total Protein: 6.2 g/dL — ABNORMAL LOW (ref 6.5–8.1)

## 2018-12-11 SURGERY — ESOPHAGOGASTRODUODENOSCOPY (EGD) WITH PROPOFOL
Anesthesia: Monitor Anesthesia Care

## 2018-12-11 MED ORDER — DARBEPOETIN ALFA 60 MCG/0.3ML IJ SOSY
60.0000 ug | PREFILLED_SYRINGE | Freq: Once | INTRAMUSCULAR | Status: AC
  Administered 2018-12-11: 60 ug via SUBCUTANEOUS
  Filled 2018-12-11: qty 0.3

## 2018-12-11 MED ORDER — PROPOFOL 10 MG/ML IV BOLUS
INTRAVENOUS | Status: AC
  Filled 2018-12-11: qty 20

## 2018-12-11 MED ORDER — MIDAZOLAM HCL 5 MG/5ML IJ SOLN
INTRAMUSCULAR | Status: DC | PRN
  Administered 2018-12-11: 1 mg via INTRAVENOUS

## 2018-12-11 MED ORDER — PROPOFOL 500 MG/50ML IV EMUL
INTRAVENOUS | Status: DC | PRN
  Administered 2018-12-11: 35 ug/kg/min via INTRAVENOUS

## 2018-12-11 MED ORDER — KETAMINE HCL 50 MG/5ML IJ SOSY
PREFILLED_SYRINGE | INTRAMUSCULAR | Status: AC
  Filled 2018-12-11: qty 5

## 2018-12-11 MED ORDER — LACTATED RINGERS IV SOLN: INTRAVENOUS | Status: DC

## 2018-12-11 MED ORDER — MEPERIDINE HCL 50 MG/ML IJ SOLN: 6.2500 mg | INTRAMUSCULAR | Status: DC | PRN

## 2018-12-11 MED ORDER — LACTATED RINGERS IV SOLN
INTRAVENOUS | Status: DC
  Administered 2018-12-11: 1000 mL via INTRAVENOUS

## 2018-12-11 MED ORDER — KETAMINE HCL 10 MG/ML IJ SOLN
INTRAMUSCULAR | Status: DC | PRN
  Administered 2018-12-11: 10 mg via INTRAVENOUS

## 2018-12-11 MED ORDER — SODIUM BICARBONATE 650 MG PO TABS
1300.0000 mg | ORAL_TABLET | Freq: Two times a day (BID) | ORAL | Status: DC
  Administered 2018-12-11 – 2018-12-15 (×9): 1300 mg via ORAL
  Filled 2018-12-11 (×9): qty 2

## 2018-12-11 MED ORDER — HYDROMORPHONE HCL 1 MG/ML IJ SOLN: 0.2500 mg | INTRAMUSCULAR | Status: DC | PRN

## 2018-12-11 MED ORDER — SODIUM CHLORIDE 0.9 % IV SOLN
510.0000 mg | Freq: Once | INTRAVENOUS | Status: AC
  Administered 2018-12-11: 510 mg via INTRAVENOUS
  Filled 2018-12-11: qty 17

## 2018-12-11 MED ORDER — HALOPERIDOL LACTATE 5 MG/ML IJ SOLN
1.0000 mg | Freq: Four times a day (QID) | INTRAMUSCULAR | Status: DC | PRN
  Administered 2018-12-13 – 2018-12-15 (×3): 1 mg via INTRAVENOUS
  Filled 2018-12-11 (×3): qty 1

## 2018-12-11 MED ORDER — LIDOCAINE HCL (PF) 1 % IJ SOLN
INTRAMUSCULAR | Status: DC | PRN
  Administered 2018-12-11: 3 mL

## 2018-12-11 MED ORDER — PROPOFOL 10 MG/ML IV BOLUS
INTRAVENOUS | Status: DC | PRN
  Administered 2018-12-11 (×2): 15 mg via INTRAVENOUS

## 2018-12-11 MED ORDER — MIDAZOLAM HCL 2 MG/2ML IJ SOLN
INTRAMUSCULAR | Status: AC
  Filled 2018-12-11: qty 2

## 2018-12-11 MED ORDER — KETOROLAC TROMETHAMINE 30 MG/ML IJ SOLN
15.0000 mg | Freq: Once | INTRAMUSCULAR | Status: AC
  Administered 2018-12-11: 15 mg via INTRAVENOUS
  Filled 2018-12-11: qty 1

## 2018-12-11 MED ORDER — MORPHINE SULFATE (PF) 2 MG/ML IV SOLN
1.0000 mg | INTRAVENOUS | Status: DC | PRN
  Administered 2018-12-11 – 2018-12-14 (×4): 1 mg via INTRAVENOUS
  Filled 2018-12-11 (×4): qty 1

## 2018-12-11 MED ORDER — STERILE WATER FOR IRRIGATION IR SOLN
Status: DC | PRN
  Administered 2018-12-11: 1000 mL

## 2018-12-11 MED ORDER — PROMETHAZINE HCL 25 MG/ML IJ SOLN: 6.2500 mg | INTRAMUSCULAR | Status: DC | PRN

## 2018-12-11 SURGICAL SUPPLY — 6 items
GLOVE BIOGEL PI IND STRL 6.5 (GLOVE) IMPLANT
GLOVE BIOGEL PI INDICATOR 6.5 (GLOVE) ×2
GLOVE SS BIOGEL STRL SZ 7.5 (GLOVE) ×1 IMPLANT
GLOVE SUPERSENSE BIOGEL SZ 7.5 (GLOVE) ×2
KIT PEG SAFETY 20FR (KITS) ×3 IMPLANT
KIT TURNOVER KIT A (KITS) ×3 IMPLANT

## 2018-12-11 NOTE — Progress Notes (Signed)
Late entry for 1645. Patient became agitated, pulling telemetry monitor off, trying to get out of bed, calling out for help. On arrival to room, patient found to have removed O2 and telemetry monitor, crying out stating "I'm in pain, my shoulders, my ribs, my abdomen." assisted back to bed to comfortable position. bedalarm remained on for safety. O2 and telemetry reapplied. Patient remained alert and oriented x 4. Abdomen soft, PEG tube feedings as ordered, tolerating well, residual 50 ml. Notified Dr. Manuella Ghazi. Stated to go ahead with morphine as ordered for pain and would order Haldol for agitation if needed. On reassessment, patient reported decreased pain and remained calm. Nursing to continue monitoring. Donavan Foil, RN

## 2018-12-11 NOTE — Consult Note (Signed)
[place] Admit Date: 11/25/2018 11/24/2018 [place] Agent Requesting Physician:  Manuella Ghazi DO  Reason for Consult:  Progressive renal failure, acidosis HPI:  83 year old male admitted 11/30/2018 after presenting with exertional dyspnea and found to have a hemoglobin of 6.3.  Patient initially managed with diuretics, and subsequently had an aspiration event and has been treated for aspiration pneumonia with Unasyn.  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  It appears that his baseline serum creatinine from past November was around 3.  He does not follow with nephrology.  No history of renal ultrasound.  Urine analysis from November 2019 without hematuria, proteinuria, pyuria.  Home medicines do not include ACE inhibitor.  No history of NSAID use prior to admission but did receive a dose of Toradol today following placement of PEG tube.  Cumulative I's and O's are 2.4 L positive and weights are fairly stable.  Patient currently receiving D5 half-normal saline at 75 mL's per hour.  As mentioned, he had a PEG tube placed today.  Patient denies significant lower urinary tract symptoms prior to admission.  He denies any unusual bruising or petechial rashes.  No significant arthralgias.  No gross hematuria or foamy urine.  Patient currently using condom urinary catheter.  Patient has had serum bicarbonate in the teens, 14 today with anion gap of 10.  Serum albumin is 2.4.  Serum total protein is 6.2.  Hemoglobin has increased to 7.4.  Calcium levels have been normal.  He feels pretty well and his main issue is that he wants to go home.  Plans are being made for SNF placement upon discharge.  Creatinine, Ser (mg/dL)  Date Value  12/04/2018 5.34 (H)  12/10/2018 5.26 (H)  12/09/2018 5.15 (H)  12/08/2018 5.14 (H)  12/07/2018 4.93 (H)   12/11/2018 4.66 (H)  12/05/2018 4.07 (H)  12/17/2018 3.94 (H)  07/22/2018 2.98 (H)  07/21/2018 3.13 (H)  ] I/Os: I/O last 3 completed shifts: In: 4185.5 [I.V.:4085.5; IV Piggyback:100] Out: 1800 [Urine:1800]   ROS NSAIDS: Received Toradol today in perioperative setting, otherwise no exposure IV Contrast no exposure TMP/SMX no exposure identified Hypotension not present Balance of 12 systems is negative w/ exceptions as above  PMH  Past Medical History:  Diagnosis Date  . Arthritis    left knee  . Stroke [facility])    07/18/18   [place]  Past Surgical History:  Procedure Laterality Date  . APPENDECTOMY    . left knee surgery    . LOOP RECORDER INSERTION N/A 07/22/2018   Procedure: LOOP RECORDER INSERTION;  Surgeon: Evans Lance, MD;  Location: [facility];  Service: Cardiovascular;  Laterality: N/A;  . TEE WITHOUT CARDIOVERSION N/A 07/22/2018   Procedure: TRANSESOPHAGEAL ECHOCARDIOGRAM (TEE);  Surgeon: Dorothy Spark, MD;  Location: [facility] ENDOSCOPY;  Service: Cardiovascular;  Laterality: N/A;  . TONSILLECTOMY     FH History reviewed. No pertinent family history. SH  reports that he has quit smoking. He has never used smokeless tobacco. He reports that he does not drink alcohol or use drugs. Allergies No Known Allergies Home medications Prior to Admission medications   Medication Sig Start Date End Date Taking? Authorizing Provider  amLODipine (NORVASC) 10 MG tablet Take 1 tablet (10 mg total) by mouth daily. 07/23/18  Yes Donzetta Starch, NP  atorvastatin (LIPITOR) 20 MG tablet Take 1 tablet (20 mg  total) by mouth daily at 6 PM. 07/22/18  Yes Biby, Massie Kluver, NP  hydrALAZINE (APRESOLINE) 25 MG tablet Take 1 tablet (25 mg total) by mouth every 8 (eight) hours. Patient taking differently: Take 50 mg by mouth every 8 (eight) hours.  07/22/18  Yes Donzetta Starch, NP  Multiple Vitamins-Minerals (MENS MULTIPLUS PO) Take 1 tablet by mouth daily.     Yes [provider]   clopidogrel (PLAVIX) 75 MG tablet Take 1 tablet (75 mg total) by mouth daily. Patient not taking: Reported on 11/27/2018 07/23/18   Donzetta Starch, NP  HYDROcodone-acetaminophen [facility]) 5-325 MG per tablet 1/2 to 1 tablet by mouth every 4 hours prn pain Patient not taking: Reported on 12/08/2018 07/02/11   Evalee Jefferson, PA-C    Current Medications Scheduled Meds: . albuterol  2.5 mg Nebulization TID  . amLODipine  10 mg Oral Daily  . aspirin  81 mg Oral Daily  . atorvastatin  20 mg Oral q1800  . feeding supplement (PRO-STAT SUGAR FREE 64)  30 mL Per Tube Daily  . free water  120 mL Per Tube Q4H  . guaiFENesin  600 mg Oral BID  . hydrALAZINE  25 mg Oral Q8H  . pantoprazole  40 mg Oral BID AC  . predniSONE  40 mg Oral Q breakfast  . sodium chloride flush  3 mL Intravenous Q12H   Continuous Infusions: . sodium chloride    . sodium chloride Stopped (11/27/2018 2229)  . ampicillin-sulbactam (UNASYN) IV 3 g (12/10/18 1721)  . dextrose 5 % and 0.45% NaCl 75 mL/hr at 11/24/2018 1108  . feeding supplement (OSMOLITE 1.5 CAL)     PRN Meds:.sodium chloride, sodium chloride, acetaminophen, guaiFENesin-dextromethorphan, hydrALAZINE, HYDROcodone-acetaminophen, morphine injection, ondansetron (ZOFRAN) IV, Resource ThickenUp Clear, sodium chloride flush  CBC Recent Labs  Lab 12/09/18 0452 12/10/18 0500 12/14/2018 0440  WBC 9.8 9.7 11.1*  HGB 7.0* 7.2* 7.4*  HCT 23.4* 23.5* 24.4*  MCV 94.0 92.5 93.1  PLT 167 181 951   Basic Metabolic Panel Recent Labs  Lab 12/05/18 0455 12/10/2018 0426 12/07/18 0510 12/08/18 0641 12/09/18 0452 12/10/18 0500 12/10/2018 0440  NA 141 142 138 139 141 141 139  K 4.7 4.6 4.0 4.4 4.5 4.4 4.7  CL 114* 114* 111 111 114* 115* 115*  CO2 17* 17* 17* 17* 17* 14* 14*  GLUCOSE 89 98 138* 111* 104* 169* 166*  BUN 89* 90* 87* 85* 83* 85* 108*  CREATININE 4.07* 4.66* 4.93* 5.14* 5.15* 5.26* 5.34*  CALCIUM 8.7* 8.3* 7.9* 8.0* 8.2* 8.4* 8.4*  PHOS  --   --   --   --  7.3*  7.7*  --     Physical Exam  Blood pressure 114/87, pulse 87, temperature 97.6 F (36.4 C), temperature source Oral, resp. rate 18, height 5\' 10"  (1.778 m), weight 66 kg, SpO2 94 %. GEN: Elderly male, chronically ill-appearing, left-sided facial droop, no distress ENT: Fair dentition EYES: EOMI CV: Regular, normal S1-S2, no murmur or rub PULM: Coarse breath sounds in the bases bilaterally, normal work of breathing ABD: Soft, nontender, PEG tube site clean/dry/intact GU: Normal genitalia with condom catheter SKIN: No rashes or lesions identified, no petechia or purpura EXT: 2+ pitting edema to the mid shins NEURO: Left-sided hemiparesis   Assessment 83 year old male with progressive CKD 4, likely an acute component related to his anemia and aspiration pneumonia.  No indications immediately for RRT however GFR is very low.  1. CKD4 with progression, likely AKI, presumed related  ot acute health conditions; need to exclude obstruction, monoclonal process 2. Anemia, with IDA TSAT 5% and Ferritin 242, also related to #1; rec 510mg  IV Fereheme 3/18 3. NAG metabolic Acidosis related to #1 4. Aspiration PNA on unasyn per TRH 5. Progressive debility 6. Hypervolemia / LEE 7. PCM, hypoalbuminemia 8. HTN on CCB and hydralazine 9. R rotator cuff pain, on prednisone 10. Dysphagia s/p PEG 12/07/2018  Plan 1. Renal US and UA 2. SPEP, and SFLC 3. Give another 510mg  of fereheme today + aranesp 17mcg 4. Hold IVFs with hyeprvolemia, any pre-renal component has been addressed 5. Start NaHCO3 1300 BID for acidosis 6. Broached RRT, is a marginal candidate at best.  Hopefully tx of acute comorbidities will help GFR; will cont to dialogue as indicated 7. Daily weights, Daily Renal Panel, Strict I/Os, Avoid nephrotoxins (NSAIDs, judicious IV Contrast)  8. We will follow along   Pearson Grippe MD [phone] pgr 11/17/2018, 1:08 PM

## 2018-12-11 NOTE — Progress Notes (Signed)
Nutrition Brief Note  RD reconsulted for tube feeding s/p PEG.   RD ordered tube feeding regimen yesterday (See that note for regimen).  Confirmed Orders are still in place at this time. No flushes have been ordered by RD. Already has exisiting flush orders. Deferring fluid management to MD.   Will monitor documentation and look for possible s/s of intolerance. Will continue w/ continuous feeds x24-48 hrs and if tolerating, can transition to bolus in anticipation of D/C to SNF.   If concerns regarding TF regimen arise, RD can be reached via secure chat or pager below.   Burtis Junes RD, LDN, CNSC Clinical Nutrition Available Tues-Sat via Pager: [phone] 11/25/2018 12:06 PM

## 2018-12-11 NOTE — Progress Notes (Signed)
PT Cancellation Note  Patient Details Name: Cameron Boyle MRN: [id number] DOB: 1935/09/22   Cancelled Treatment:    Reason Eval/Treat Not Completed: Patient at procedure or test/unavailable  Patient is receiving surgery this morning. Therefore no therapy today.   Clarene Critchley PT, DPT 10:05 AM, 12/04/2018 [phone]

## 2018-12-11 NOTE — NC FL2 (Signed)
[place] MEDICAID FL2 LEVEL OF CARE SCREENING TOOL     IDENTIFICATION  Patient Name: Cameron Boyle Birthdate: 1936/03/16 Sex: male Admission Date (Current Location): 11/27/2018  [facility] and [place] Number:  [employer] and Address:  [place] [address] [place]      Provider Number: [id number]  Attending Physician Name and Address:  Rodena Goldmann, DO  Relative Name and Phone Number:       Current Level of Care: Hospital Recommended Level of Care: [place] Prior Approval Number:    Date Approved/Denied:   PASRR Number: [id number]  Discharge Plan: SNF    Current Diagnoses: Patient Active Problem List   Diagnosis Date Noted  . Dysphagia   . Cough   . Symptomatic anemia   . Aspiration pneumonia due to regurgitated food ([place])   . Acute blood loss anemia 11/21/2018  . Acute on chronic diastolic CHF (congestive heart failure) ([place]) 11/26/2018  . Acute renal failure superimposed on stage 4 chronic kidney disease ([place]) 12/01/2018  . Transfusion-dependent anemia   . Essential hypertension 07/22/2018  . Hyperlipidemia 07/22/2018  . CKD (chronic kidney disease) 07/22/2018  . Anemia likely d/t chronic kidney disease 07/22/2018  . 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, embolic, unknown source 38/46/6599    Orientation RESPIRATION BLADDER Height & Weight     Self, Time, Situation, Place  Normal Incontinent Weight: 145 lb 8.1 oz (66 kg) Height:  5\' 10"  (177.8 cm)  BEHAVIORAL SYMPTOMS/MOOD NEUROLOGICAL BOWEL NUTRITION STATUS      Incontinent Diet(see discharge summary )  AMBULATORY STATUS COMMUNICATION OF NEEDS Skin   Extensive Assist Verbally Surgical wounds(abdomen)                       Personal Care Assistance Level of Assistance  Bathing, Feeding, Dressing Bathing Assistance: Limited assistance Feeding assistance: Maximum assistance(PEG Tube placed on 11/17/2018) Dressing Assistance:  Limited assistance     Functional Limitations Info  Sight, Hearing, Speech Sight Info: Adequate Hearing Info: Adequate Speech Info: Adequate    SPECIAL CARE FACTORS FREQUENCY  PT (By licensed PT), Speech therapy     PT Frequency: 5x/week       Speech Therapy Frequency: 3x/week       Contractures Contractures Info: Not present    Additional Factors Info  Code Status, Allergies Code Status Info: Full Code Allergies Info: NKA           Current Medications (12/11/2018):  This is the current hospital active medication list Current Facility-Administered Medications  Medication Dose Route Frequency Provider Last Rate Last Dose  . 0.9 %  sodium chloride infusion  250 mL Intravenous PRN Aviva Signs, MD      . 0.9 %  sodium chloride infusion   Intravenous PRN Aviva Signs, MD   Stopped at 11/20/2018 2229  . acetaminophen (TYLENOL) tablet 650 mg  650 mg Oral Q4H PRN Aviva Signs, MD   650 mg at 12/07/18 1608  . albuterol (PROVENTIL) (2.5 MG/3ML) 0.083% nebulizer solution 2.5 mg  2.5 mg Nebulization TID Aviva Signs, MD   2.5 mg at 12/10/18 1923  . amLODipine (NORVASC) tablet 10 mg  10 mg Oral Daily Aviva Signs, MD   10 mg at 12/10/18 0945  . Ampicillin-Sulbactam (UNASYN) 3 g in sodium chloride 0.9 % 100 mL IVPB  3 g Intravenous Q24H Aviva Signs, MD 200 mL/hr at 12/10/18 1721 3 g at 12/10/18 1721  .  aspirin chewable tablet 81 mg  81 mg Oral Daily Aviva Signs, MD   81 mg at 12/10/18 0945  . atorvastatin (LIPITOR) tablet 20 mg  20 mg Oral q1800 Aviva Signs, MD   20 mg at 12/10/18 1733  . dextrose 5 %-0.45 % sodium chloride infusion   Intravenous Continuous Aviva Signs, MD 75 mL/hr at 11/29/2018 1108    . feeding supplement (OSMOLITE 1.5 CAL) liquid 1,000 mL  1,000 mL Per Tube Continuous Aviva Signs, MD      . feeding supplement (PRO-STAT SUGAR FREE 64) liquid 30 mL  30 mL Per Tube Daily Aviva Signs, MD   30 mL at 12/10/18 1710  . free water 120 mL  120 mL Per Tube Q4H  Aviva Signs, MD   Stopped at 11/18/2018 0900  . guaiFENesin (MUCINEX) 12 hr tablet 600 mg  600 mg Oral BID Aviva Signs, MD   600 mg at 12/10/18 2236  . guaiFENesin-dextromethorphan (ROBITUSSIN DM) 100-10 MG/5ML syrup 15 mL  15 mL Oral Q4H PRN Aviva Signs, MD   15 mL at 12/07/18 2044  . hydrALAZINE (APRESOLINE) injection 10 mg  10 mg Intravenous Q6H PRN Aviva Signs, MD      . hydrALAZINE (APRESOLINE) tablet 25 mg  25 mg Oral Q8H Aviva Signs, MD   25 mg at 12/10/18 2236  . HYDROcodone-acetaminophen (NORCO/VICODIN) 5-325 MG per tablet 1-2 tablet  1-2 tablet Oral Q6H PRN Aviva Signs, MD   1 tablet at 12/09/18 [id number]  . morphine 2 MG/ML injection 1 mg  1 mg Intravenous Q2H PRN Aviva Signs, MD      . ondansetron [facility]) injection 4 mg  4 mg Intravenous Q6H PRN Aviva Signs, MD      . pantoprazole (PROTONIX) EC tablet 40 mg  40 mg Oral BID AC Aviva Signs, MD   40 mg at 12/10/18 1710  . predniSONE (DELTASONE) tablet 40 mg  40 mg Oral Q breakfast Aviva Signs, MD      . Resource ThickenUp Clear   Oral PRN Aviva Signs, MD      . sodium chloride flush (NS) 0.9 % injection 3 mL  3 mL Intravenous Q12H Aviva Signs, MD   3 mL at 12/10/18 2237  . sodium chloride flush (NS) 0.9 % injection 3 mL  3 mL Intravenous PRN Aviva Signs, MD         Discharge Medications: Please see discharge summary for a list of discharge medications.  Relevant Imaging Results:  Relevant Lab Results:   Additional Information SSN [id number] Clydene Pugh, LCSW

## 2018-12-11 NOTE — Interval H&P Note (Signed)
History and Physical Interval Note:  12/09/2018 8:28 AM  Cameron Boyle  has presented today for surgery, with the diagnosis of dysphagia.  The various methods of treatment have been discussed with the patient and family. After consideration of risks, benefits and other options for treatment, the patient has consented to  Procedure(s): ESOPHAGOGASTRODUODENOSCOPY (EGD) WITH PROPOFOL (N/A) PERCUTANEOUS ENDOSCOPIC GASTROSTOMY (PEG) PLACEMENT (N/A) as a surgical intervention.  The patient's history has been reviewed, patient examined, no change in status, stable for surgery.  I have reviewed the patient's chart and labs.  Questions were answered to the patient's satisfaction.     Aviva Signs

## 2018-12-11 NOTE — Progress Notes (Signed)
Patient became agitated and had removed gown , O2 and telemetry monitor. Sitting up in bed attempting to get up per nurse tech report. On nurse arrival to room, patient was sitting in bed, had refused to allow NT to reapply tele monitor. Stated "I want to go home, i'm tired of sitting here." discussed with patient that MD wanted nephrologist to see him today and no plan for discharge today. A&O x 4 on assessment, reapplied O2 and tele monitor. Pt requested nursing call his wife since he could not reach her. Called patient's wife for him and transferred to his room so they could speak. Notified Dr. Manuella Ghazi. Nursing to continue monitoring. bedalarm on for safety. Call light, phone and personal items kept within reach. Donavan Foil, RN

## 2018-12-11 NOTE — Anesthesia Postprocedure Evaluation (Signed)
Anesthesia Post Note  Patient: Cameron Boyle  Procedure(s) Performed: ESOPHAGOGASTRODUODENOSCOPY (EGD) WITH PROPOFOL (scope in 0919; scope out 0925) (N/A ) PERCUTANEOUS ENDOSCOPIC GASTROSTOMY (PEG) PLACEMENT (N/A )  Patient location during evaluation: PACU Anesthesia Type: MAC Level of consciousness: awake and alert Pain management: pain level controlled Vital Signs Assessment: post-procedure vital signs reviewed and stable Respiratory status: spontaneous breathing, nonlabored ventilation and respiratory function stable Cardiovascular status: blood pressure returned to baseline Postop Assessment: no apparent nausea or vomiting Anesthetic complications: no     Last Vitals:  Vitals:   11/21/2018 0800 11/22/2018 0935  BP: (!) 141/75 (!) 133/94  Pulse:  83  Resp: 18 19  Temp: 36.5 C (!) 36.4 C  SpO2: 90% 98%    Last Pain:  Vitals:   11/18/2018 0800  TempSrc: Oral  PainSc:                  Jenyfer Trawick J

## 2018-12-11 NOTE — Anesthesia Preprocedure Evaluation (Signed)
Anesthesia Evaluation    Airway Mallampati: II       Dental  (+) Teeth Intact, Dental Advidsory Given   Pulmonary pneumonia, former smoker,    + rhonchi        Cardiovascular hypertension, On Medications +CHF   Rhythm:regular     Neuro/Psych CVA, Residual Symptoms    GI/Hepatic   Endo/Other    Renal/GU CRFRenal disease     Musculoskeletal   Abdominal   Peds  Hematology  (+) Blood dyscrasia, anemia ,   Anesthesia Other Findings CVA 2* to Right MCA embolism s/p tPA, residual sx Aspiration pneumona 2* regurgitated food Stage 4 KD NG tube in situ S/P one unit PRBC for sx anemia  Reproductive/Obstetrics                             Anesthesia Physical Anesthesia Plan  ASA: IV  Anesthesia Plan: MAC   Post-op Pain Management:    Induction:   PONV Risk Score and Plan:   Airway Management Planned:   Additional Equipment:   Intra-op Plan:   Post-operative Plan:   Informed Consent: I have reviewed the patients History and Physical, chart, labs and discussed the procedure including the risks, benefits and alternatives for the proposed anesthesia with the patient or authorized representative who has indicated his/her understanding and acceptance.     Dental Advisory Given  Plan Discussed with: Anesthesiologist  Anesthesia Plan Comments:         Anesthesia Quick Evaluation

## 2018-12-11 NOTE — Transfer of Care (Signed)
Immediate Anesthesia Transfer of Care Note  Patient: Cameron Boyle  Procedure(s) Performed: ESOPHAGOGASTRODUODENOSCOPY (EGD) WITH PROPOFOL (scope in 0919; scope out 0925) (N/A ) PERCUTANEOUS ENDOSCOPIC GASTROSTOMY (PEG) PLACEMENT (N/A )  Patient Location: PACU  Anesthesia Type:MAC  Level of Consciousness: awake and patient cooperative  Airway & Oxygen Therapy: Patient Spontanous Breathing and Patient connected to face mask oxygen  Post-op Assessment: Report given to RN, Post -op Vital signs reviewed and stable and Patient moving all extremities  Post vital signs: Reviewed and stable  Last Vitals:  Vitals Value Taken Time  BP    Temp    Pulse 86 12/17/2018  9:37 AM  Resp 16 11/23/2018  9:37 AM  SpO2 98 % 12/05/2018  9:37 AM  Vitals shown include unvalidated device data.  Last Pain:  Vitals:   12/02/2018 0800  TempSrc: Oral  PainSc:       Patients Stated Pain Goal: 1 (60/10/93 2355)  Complications: No apparent anesthesia complications

## 2018-12-11 NOTE — Progress Notes (Signed)
PROGRESS NOTE    Cameron Boyle  OFB:[id number] DOB: 06/06/1936 DOA: 12/16/2018 PCP: [name], MD (Inactive)   Brief Narrative:  Per HPI: 83 y.o.malewith medical history ofdiastolic dysfunction, right MCA stroke, hypertension, hyperlipidemia, CKD stage IV presenting with 1 to 48-month history of dyspnea on exertion.  Admitted on 11/28/2018 with tachycardia, volume overload symptoms as well as hemoglobin dropping down to 6.7, improved posttransfusion, developed pneumonia while undergoing bowel prep, query aspiration--- Failed MBS on 12/09/18--please see full report... Pt would be a candidate for UES dilation or Botox and try dysphagia therapy again following.  In the meantime would likely need PEG tube to maintain nutritional/hydration needs.  Patient has received a PEG tube on 3/25 and appears to be feeling quite weak, but maintains adequate urine output.  Nephrology consulted.   Assessment & Plan:   Active Problems:   Acute blood loss anemia   Acute on chronic diastolic CHF (congestive heart failure) (HCC)   Acute renal failure superimposed on stage 4 chronic kidney disease (HCC)   Transfusion-dependent anemia   Cough   Symptomatic anemia   Aspiration pneumonia due to regurgitated food ([place])   Dysphagia  1. Acute on chronic blood loss anemia secondary to acute GI bleed.  Patient continues to maintain stable hemoglobin levels and will recheck CBC in a.m.  He has received Feraheme due to iron deficiency and appreciate GI consultation.  Will transfuse for any further drops in hemoglobin.  May require EPO per nephrology.  No overt bleeding identified. 2. Bilateral pneumonia likely secondary to aspiration.  Continue on Unasyn and plan to transition to Augmentin if tolerating tube feeds by a.m. 3. Dysphagia.  Patient has had chronic issues with this and may be related to prior stroke.  He is now status post PEG tube placement with tube feeds to begin today. Discussed with Dr. [name], ENT at [facility] [phone] and [phone]) to see if patient is a candidate for UES dilatation, [name] states that due to COVID 19 transfer restrictions they cannot accept routine/elective transfers at this time ... Patient may see ENT as outpatient down the road in the meantime patient will most likely need PEG tube in order to meet his nutritional/hydration needs...... N.p.o. for now... May have medications crushed in applesauce until PEG tube is placed-- Per [name] --possible PEG tube placement on 12/14/2018 4. Acute on chronic diastolic CHF.  Avoid aggressive diuresis due to AKI concerns.  Appreciate nephrology evaluation and volume management. 5. AKI on CKD stage IV.  Creatinine continues to uptrend despite use of IV fluid.  Patient is otherwise nonoliguric.  He is also noted to be acidotic and with up trending potassium levels.  [place] nephrology consultation today. 6. Essential hypertension-stable.  Continue amlodipine and hydralazine. 7. Dyslipidemia.  Continue statin. 8. History of right MCA stroke.  Continue on aspirin 81 mg for now and plan to restart Plavix in a.m. 3/26. 9. Right rotator cuff injury.  X-rays with no significant findings continue prednisone for 2 more days. 10. Acute hypoxemic respiratory failure likely secondary to aspiration pneumonia with underlying diastolic congestive heart failure.  Wean oxygen as tolerated.   DVT prophylaxis: SCDs Code Status: Full Family Communication: Discussed with wife on phone Disposition Plan: Nephrology evaluation today.  Plan to start tube feeds and will transition to oral Augmentin once tube feeds are tolerated by a.m.  Anticipate discharge to SNF when bed available.   Consultants:   General surgery  Nephrology  Procedures:  PEG tube placement 3/25  Antimicrobials:   Unasyn 3/23->   Subjective: Patient seen and evaluated today with no new acute complaints or concerns. No acute  concerns or events noted overnight.  He is status post PEG tube placement.  He appears to be weak and denies any significant complaints or concerns.  Objective: Vitals:   12/17/2018 1000 12/13/2018 1024 11/19/2018 1119 12/06/2018 1119  BP: 130/66 134/61  (!) 143/73  Pulse: 82 84  84  Resp: 15 19  18   Temp:  (!) 96.5 F (35.8 C) (!) 97 F (36.1 C)   TempSrc:   Oral   SpO2: 92% 96%  96%  Weight:      Height:        Intake/Output Summary (Last 24 hours) at 11/19/2018 1144 Last data filed at 11/18/2018 1008 Gross per 24 hour  Intake 1420.52 ml  Output 950 ml  Net 470.52 ml   Filed Weights   12/09/18 0549 12/10/18 0500 12/10/2018 0644  Weight: 62.2 kg 62.8 kg 66 kg    Examination:  General exam: Appears calm and comfortable  Respiratory system: Clear to auscultation. Respiratory effort normal. 3-4L [place]. Cardiovascular system: S1 & S2 heard, RRR. No JVD, murmurs, rubs, gallops or clicks. No pedal edema. Gastrointestinal system: Abdomen is nondistended, soft and nontender. No organomegaly or masses felt. Normal bowel sounds heard. PEG C/D/I. Central nervous system: Alert and oriented. No focal neurological deficits. Extremities: Symmetric 5 x 5 power. Skin: No rashes, lesions or ulcers Psychiatry: Difficult to assess. Foley catheter with clear, yellow urine output.    Data Reviewed: I have personally reviewed following labs and imaging studies  CBC: Recent Labs  Lab 12/08/2018 0830 12/07/18 0510 12/09/18 0452 12/10/18 0500 11/30/2018 0440  WBC 14.2* 9.5 9.8 9.7 11.1*  HGB 7.5* 7.2* 7.0* 7.2* 7.4*  HCT 24.1* 23.1* 23.4* 23.5* 24.4*  MCV 93.1 93.1 94.0 92.5 93.1  PLT 187 151 167 181 601   Basic Metabolic Panel: Recent Labs  Lab 12/07/18 0510 12/08/18 0641 12/09/18 0452 12/10/18 0500 12/07/2018 0440  NA 138 139 141 141 139  K 4.0 4.4 4.5 4.4 4.7  CL 111 111 114* 115* 115*  CO2 17* 17* 17* 14* 14*  GLUCOSE 138* 111* 104* 169* 166*  BUN 87* 85* 83* 85* 108*  CREATININE 4.93*  5.14* 5.15* 5.26* 5.34*  CALCIUM 7.9* 8.0* 8.2* 8.4* 8.4*  PHOS  --   --  7.3* 7.7*  --    GFR: Estimated Creatinine Clearance: 10.3 mL/min (A) (by C-G formula based on SCr of 5.34 mg/dL (H)). Liver Function Tests: Recent Labs  Lab 12/09/18 0452 12/10/18 0500 12/09/2018 0440  AST 47*  --  38  ALT 27  --  34  ALKPHOS 74  --  85  BILITOT 0.5  --  0.4  PROT 6.1*  --  6.2*  ALBUMIN 2.5*  2.5* 2.4* 2.4*   Recent Labs  Lab 12/09/18 0452  LIPASE 32   No results for input(s): AMMONIA in the last 168 hours. Coagulation Profile: No results for input(s): INR, PROTIME in the last 168 hours. Cardiac Enzymes: No results for input(s): CKTOTAL, CKMB, CKMBINDEX, TROPONINI in the last 168 hours. BNP (last 3 results) No results for input(s): PROBNP in the last 8760 hours. HbA1C: No results for input(s): HGBA1C in the last 72 hours. CBG: No results for input(s): GLUCAP in the last 168 hours. Lipid Profile: No results for input(s): CHOL, HDL, LDLCALC, TRIG, CHOLHDL, LDLDIRECT in the last 72 hours.  Thyroid Function Tests: No results for input(s): TSH, T4TOTAL, FREET4, T3FREE, THYROIDAB in the last 72 hours. Anemia Panel: No results for input(s): VITAMINB12, FOLATE, FERRITIN, TIBC, IRON, RETICCTPCT in the last 72 hours. Sepsis Labs: No results for input(s): PROCALCITON, LATICACIDVEN in the last 168 hours.  No results found for this or any previous visit (from the past 240 hour(s)).       Radiology Studies: Dg Shoulder Right  Result Date: 12/09/2018 CLINICAL DATA:  Right shoulder pain. No known injury. EXAM: RIGHT SHOULDER - 2+ VIEW COMPARISON:  None. FINDINGS: No fracture or dislocation. Mild acromioclavicular degenerative change. No bony destruction or suspicious bone lesion. No periarticular soft tissue calcifications. IMPRESSION: Mild acromioclavicular degenerative change. No acute bony abnormality. Electronically Signed   By: Keith Rake M.D.   On: 12/09/2018 19:17   Dg Chest  Port 1 View  Result Date: 12/09/2018 CLINICAL DATA:  Nasogastric tube placement. EXAM: PORTABLE CHEST 1 VIEW COMPARISON:  Chest radiograph 12/07/2018 FINDINGS: Enteric tube in place with tip below the diaphragm not included in the field of view, the side port is just beyond the gastroesophageal junction. Implanted loop recorder again projects over the left chest wall. Multifocal bilateral airspace opacities, slight worsening over the last 2 days. Unchanged heart size and mediastinal contours. Possible small right pleural effusion. No pneumothorax. IMPRESSION: 1. Enteric tube in place with tip below the diaphragm not included in the field of view, the side port is just beyond the gastroesophageal junction in the stomach. 2. Multifocal bilateral airspace opacities suspicious for pneumonia, slight worsening over the last 2 days. Electronically Signed   By: Keith Rake M.D.   On: 12/09/2018 19:16   Dg Swallowing Func-speech Pathology  Result Date: 12/09/2018 Objective Swallowing Evaluation: Type of Study: MBS-Modified Barium Swallow Study  Patient Details Name: SHLOIMA CLINCH MRN: [id number] Date of Birth: January 06, 1936 Today's Date: 12/09/2018 Time: SLP Start Time (ACUTE ONLY): 1435 -SLP Stop Time (ACUTE ONLY): 1512 SLP Time Calculation (min) (ACUTE ONLY): 37 min Past Medical History: Past Medical History: Diagnosis Date . Arthritis   left knee . Stroke [facility])   07/18/18 Past Surgical History: Past Surgical History: Procedure Laterality Date . APPENDECTOMY   . left knee surgery   . LOOP RECORDER INSERTION N/A 07/22/2018  Procedure: LOOP RECORDER INSERTION;  Surgeon: Evans Lance, MD;  Location: [facility];  Service: Cardiovascular;  Laterality: N/A; . TEE WITHOUT CARDIOVERSION N/A 07/22/2018  Procedure: TRANSESOPHAGEAL ECHOCARDIOGRAM (TEE);  Surgeon: Dorothy Spark, MD;  Location: [facility] ENDOSCOPY;  Service: Cardiovascular;  Laterality: N/A; . TONSILLECTOMY   HPI: 83 year old male presenting for  symptomaticnormocyticanemia. Hospitalized back in November with cryptogenic stroke, initially on dual therapy with Plavix and aspirin and subsequently on Plavix only. At time of discharge on November 4 thhis hemoglobin was 10.4. Presented yesterday with a hemoglobin of 6.7. Had noted progressive weakness, lower extremity edema but no overt melena, rectal bleeding. Stool heme-negative here. Received 1 unit of packed red blood cells, 1 dose of Feraheme. Remains on Plavix. Hemoglobin up to 7.9 yesterday and drift to 7.5 this morning.Suspected occult GI bleeding in the setting of Plavix. Planned colonoscopy/EGD today, however overnight the patient began having significant coughing with GoLytely prep. Chest XRay showed patchy bilateral airspace worsening since prior study concerning for pneumonia. Concerning for aspiration pna and patient unable to complete bowel prep so TCS/EGD was cancelled. BSE requested. Pt is known to this SLP from [place] (09/26/18) and outpatient dysphagia therapy for mod/severe sensorimotor based pharyngeal dysphagia.  Pt was discharged from outpatient therapy in February with recommendation to continue HEP indefinitely (Pt with reduced awareness of severity of dysphagia).  Subjective: "Soda pop goes down ok." Special Tests: MBSS Assessment / Plan / Recommendation CHL IP CLINICAL IMPRESSIONS 12/09/2018 Clinical Impression Pt presents with worsening pharyngoesophageal phase dysphagia from 09/26/2018 characterized by reduced tongue base retraction, reduced epiglottic deflection, reduced pharyngeal pressure, and reduced UES relaxation resulting in moderate vallecular residue and mod/severe pharyngeal residue across consistencies presented, however worse with thicker textures/consistencies. Although Pt only aspirated trace amounts (during and post swallow from residuals), he is unlikely to meet nutritional needs given severity of and inefficiency of pharyngoesophageal swallow. Several postures  were trialed and head turn to the Left found to be inconsistently effective (also to the RIGHT) resulting in occasional relaxation of UES and passage of bolus. This was similar to his MBSS in January 2020, however with increased severity at this time. Pt is also deconditioned, which further negatively impacts his swallow efficiency and safety. Pt was seen for dysphagia therapy in February to address dysphagia and reported tolerating some regular textures and thin liquids at home, however continued to present with wet vocal quality. Recommend alternative means of nutrition until he can be seen by ENT who could determine if Pt would be a candidate for UES dilation or Botox and try dysphagia therapy again following. SLP discussed with Dr. Roxan Hockey who will discuss with Pt, surgery, and ENT. Consider NPO with alternative means of nutrition vs NTL only now; ice chips PRN after oral care, and continue ORAL care. SLP will follow per goals of care.  SLP Visit Diagnosis Dysphagia, pharyngoesophageal phase (R13.14) Attention and concentration deficit following -- Frontal lobe and executive function deficit following -- Impact on safety and function Moderate aspiration risk;Risk for inadequate nutrition/hydration   CHL IP TREATMENT RECOMMENDATION 12/09/2018 Treatment Recommendations Therapy as outlined in treatment plan below   Prognosis 12/09/2018 Prognosis for Safe Diet Advancement Guarded Barriers to Reach Goals Severity of deficits Barriers/Prognosis Comment -- CHL IP DIET RECOMMENDATION 12/09/2018 SLP Diet Recommendations Alternative means - long-term;Ice chips PRN after oral care Liquid Administration via -- Medication Administration Via alternative means Compensations -- Postural Changes --   CHL IP OTHER RECOMMENDATIONS 12/09/2018 Recommended Consults Consider ENT evaluation Oral Care Recommendations Staff/trained caregiver to provide oral care;Oral care QID Other Recommendations --   CHL IP FOLLOW UP  RECOMMENDATIONS 12/09/2018 Follow up Recommendations Skilled Nursing facility   [facility] IP FREQUENCY AND DURATION 12/09/2018 Speech Therapy Frequency (ACUTE ONLY) min 2x/week Treatment Duration 1 week      CHL IP ORAL PHASE 12/09/2018 Oral Phase WFL Oral - Pudding Teaspoon -- Oral - Pudding Cup -- Oral - Honey Teaspoon -- Oral - Honey Cup -- Oral - Nectar Teaspoon -- Oral - Nectar Cup -- Oral - Nectar Straw -- Oral - Thin Teaspoon -- Oral - Thin Cup -- Oral - Thin Straw -- Oral - Puree -- Oral - Mech Soft -- Oral - Regular -- Oral - Multi-Consistency -- Oral - Pill -- Oral Phase - Comment --  CHL IP PHARYNGEAL PHASE 12/09/2018 Pharyngeal Phase Impaired Pharyngeal- Pudding Teaspoon -- Pharyngeal -- Pharyngeal- Pudding Cup -- Pharyngeal -- Pharyngeal- Honey Teaspoon Delayed swallow initiation-vallecula;Delayed swallow initiation-pyriform sinuses;Reduced pharyngeal peristalsis;Reduced epiglottic inversion;Reduced tongue base retraction;Penetration/Aspiration during swallow;Penetration/Apiration after swallow;Pharyngeal residue - valleculae;Pharyngeal residue - pyriform;Pharyngeal residue - posterior pharnyx Pharyngeal Material enters airway, CONTACTS cords and not ejected out Pharyngeal- Honey Cup -- Pharyngeal -- Pharyngeal- Nectar Teaspoon Delayed swallow initiation-vallecula;Delayed swallow initiation-pyriform  sinuses;Reduced pharyngeal peristalsis;Reduced epiglottic inversion;Reduced tongue base retraction;Penetration/Aspiration during swallow;Penetration/Apiration after swallow;Pharyngeal residue - valleculae;Pharyngeal residue - pyriform;Pharyngeal residue - posterior pharnyx Pharyngeal Material enters airway, CONTACTS cords and not ejected out Pharyngeal- Nectar Cup -- Pharyngeal -- Pharyngeal- Nectar Straw -- Pharyngeal -- Pharyngeal- Thin Teaspoon Delayed swallow initiation-vallecula;Delayed swallow initiation-pyriform sinuses;Reduced pharyngeal peristalsis;Reduced epiglottic inversion;Reduced tongue base  retraction;Penetration/Aspiration during swallow;Penetration/Apiration after swallow;Pharyngeal residue - valleculae;Pharyngeal residue - pyriform;Pharyngeal residue - posterior pharnyx Pharyngeal Material enters airway, passes BELOW cords without attempt by patient to eject out (silent aspiration);Material enters airway, passes BELOW cords and not ejected out despite cough attempt by patient Pharyngeal- Thin Cup -- Pharyngeal -- Pharyngeal- Thin Straw -- Pharyngeal -- Pharyngeal- Puree Delayed swallow initiation-vallecula;Reduced pharyngeal peristalsis;Reduced epiglottic inversion;Reduced tongue base retraction;Penetration/Apiration after swallow;Pharyngeal residue - valleculae;Pharyngeal residue - pyriform;Pharyngeal residue - posterior pharnyx Pharyngeal Material enters airway, CONTACTS cords and then ejected out;Material does not enter airway Pharyngeal- Mechanical Soft -- Pharyngeal -- Pharyngeal- Regular -- Pharyngeal -- Pharyngeal- Multi-consistency -- Pharyngeal -- Pharyngeal- Pill -- Pharyngeal -- Pharyngeal Comment --  CHL IP CERVICAL ESOPHAGEAL PHASE 12/09/2018 Cervical Esophageal Phase Impaired Pudding Teaspoon -- Pudding Cup -- Honey Teaspoon -- Honey Cup -- Nectar Teaspoon -- Nectar Cup -- Nectar Straw -- Thin Teaspoon -- Thin Cup -- Thin Straw -- Puree Reduced cricopharyngeal relaxation;Prominent cricopharyngeal segment Mechanical Soft -- Regular -- Multi-consistency -- Pill -- Cervical Esophageal Comment -- Thank you, [name] Churn, CCC-SLP [phone] [place] 12/09/2018, 4:33 PM                   Scheduled Meds: . albuterol  2.5 mg Nebulization TID  . amLODipine  10 mg Oral Daily  . aspirin  81 mg Oral Daily  . atorvastatin  20 mg Oral q1800  . feeding supplement (PRO-STAT SUGAR FREE 64)  30 mL Per Tube Daily  . free water  120 mL Per Tube Q4H  . guaiFENesin  600 mg Oral BID  . hydrALAZINE  25 mg Oral Q8H  . pantoprazole  40 mg Oral BID AC  . predniSONE  40 mg Oral Q breakfast   . sodium chloride flush  3 mL Intravenous Q12H   Continuous Infusions: . sodium chloride    . sodium chloride Stopped (12/02/2018 2229)  . ampicillin-sulbactam (UNASYN) IV 3 g (12/10/18 1721)  . dextrose 5 % and 0.45% NaCl 75 mL/hr at 12/01/2018 1108  . feeding supplement (OSMOLITE 1.5 CAL)       LOS: 7 days    Time spent: 30 minutes    Viyaan Champine Darleen Crocker, DO [facility] Pager [phone]  If 7PM-7AM, please contact night-coverage [email or web address] Password [facility] 12/08/2018, 11:44 AM

## 2018-12-11 NOTE — Op Note (Signed)
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  He is a having a hard time controlling his secretions.  The patient now comes for an EGD with PEG.  The risks and benefits of the procedure including bleeding, infection, and organ injury were fully explained to the patient, who gave informed consent.  Procedure note: The patient was placed in supine position.  A timeout was performed.  After monitored anesthesia care was given, an endoscope with lidocaine gel was advanced into the second portion of the duodenum without difficulty.  The first and second portions of the duodenum were within normal limits.  The pylorus was noted be widely patent.  No ulcerations were seen in the stomach.  With the assistance of Dr. [name], the abdomen was prepped with ChloraPrep.  1% Xylocaine was used for local anesthesia.  A needle catheter was advanced into the antrum under direct visualization without difficulty.  A guidewire was then advanced into the stomach and this was grasped with the snare.  The endoscope was then removed.  A 20 French gastrostomy tube was then attached and using the pull technique, the gastrostomy tube was placed into the stomach and through the skin at the epigastric region.  It was at the 2-1/2 cm [name] at the skin level.  The endoscope was advanced back down into the stomach and adequate positioning was confirmed.  The esophagus appeared to be within normal limits.  I could not appreciate a stricture in the oropharynx region.  All air was evacuated from the stomach prior to removal of the endoscope.  Betadine ointment and a dry sterile dressing were  applied around the PEG site.  All tape and needle counts were correct at the end of the procedure.  The patient was transferred to PACU in stable condition.  Complications: None  EBL: Minimal  Specimen: None

## 2018-12-12 ENCOUNTER — Encounter (HOSPITAL_COMMUNITY): Payer: Self-pay | Admitting: General Surgery

## 2018-12-12 ENCOUNTER — Inpatient Hospital Stay (HOSPITAL_COMMUNITY): Payer: Medicare Other

## 2018-12-12 DIAGNOSIS — R1310 Dysphagia, unspecified: Secondary | ICD-10-CM

## 2018-12-12 DIAGNOSIS — Z515 Encounter for palliative care: Secondary | ICD-10-CM

## 2018-12-12 DIAGNOSIS — Z7189 Other specified counseling: Secondary | ICD-10-CM

## 2018-12-12 DIAGNOSIS — E43 Unspecified severe protein-calorie malnutrition: Secondary | ICD-10-CM

## 2018-12-12 DIAGNOSIS — Z931 Gastrostomy status: Secondary | ICD-10-CM

## 2018-12-12 LAB — BASIC METABOLIC PANEL
Anion gap: 14 (ref 5–15)
BUN: 120 mg/dL — ABNORMAL HIGH (ref 8–23)
CO2: 16 mmol/L — ABNORMAL LOW (ref 22–32)
Calcium: 8.6 mg/dL — ABNORMAL LOW (ref 8.9–10.3)
Chloride: 113 mmol/L — ABNORMAL HIGH (ref 98–111)
Creatinine, Ser: 5.33 mg/dL — ABNORMAL HIGH (ref 0.61–1.24)
GFR calc Af Amer: 11 mL/min — ABNORMAL LOW (ref >60)
GFR calc non Af Amer: 9 mL/min — ABNORMAL LOW (ref >60)
Glucose, Bld: 142 mg/dL — ABNORMAL HIGH (ref 70–99)
POTASSIUM: 4.6 mmol/L (ref 3.5–5.1)
Sodium: 143 mmol/L (ref 135–145)

## 2018-12-12 LAB — CBC
HEMATOCRIT: 24.8 % — AB (ref 39.0–52.0)
Hemoglobin: 7.5 g/dL — ABNORMAL LOW (ref 13.0–17.0)
MCH: 28.2 pg (ref 26.0–34.0)
MCHC: 30.2 g/dL (ref 30.0–36.0)
MCV: 93.2 fL (ref 80.0–100.0)
Platelets: 229 10*3/uL (ref 150–400)
RBC: 2.66 MIL/uL — AB (ref 4.22–5.81)
RDW: 14.1 % (ref 11.5–15.5)
WBC: 12.7 10*3/uL — ABNORMAL HIGH (ref 4.0–10.5)
nRBC: 0 % (ref 0.0–0.2)

## 2018-12-12 LAB — PROTEIN ELECTROPHORESIS, SERUM
A/G Ratio: 0.8 (ref 0.7–1.7)
ALBUMIN ELP: 2.4 g/dL — AB (ref 2.9–4.4)
Alpha-1-Globulin: 0.6 g/dL — ABNORMAL HIGH (ref 0.0–0.4)
Alpha-2-Globulin: 1.1 g/dL — ABNORMAL HIGH (ref 0.4–1.0)
Beta Globulin: 0.7 g/dL (ref 0.7–1.3)
GAMMA GLOBULIN: 0.8 g/dL (ref 0.4–1.8)
Globulin, Total: 3.2 g/dL (ref 2.2–3.9)
Total Protein ELP: 5.6 g/dL — ABNORMAL LOW (ref 6.0–8.5)

## 2018-12-12 LAB — KAPPA/LAMBDA LIGHT CHAINS
Kappa free light chain: 121.2 mg/L — ABNORMAL HIGH (ref 3.3–19.4)
Kappa, lambda light chain ratio: 2.12 — ABNORMAL HIGH (ref 0.26–1.65)
Lambda free light chains: 57.2 mg/L — ABNORMAL HIGH (ref 5.7–26.3)

## 2018-12-12 MED ORDER — FUROSEMIDE 10 MG/ML IJ SOLN
20.0000 mg | Freq: Once | INTRAMUSCULAR | Status: AC
  Administered 2018-12-12: 20 mg via INTRAVENOUS
  Filled 2018-12-12: qty 2

## 2018-12-12 MED ORDER — OSMOLITE 1.5 CAL PO LIQD
300.0000 mL | Freq: Four times a day (QID) | ORAL | Status: DC
  Administered 2018-12-12 – 2018-12-15 (×12): 300 mL
  Filled 2018-12-12 (×17): qty 1000

## 2018-12-12 MED ORDER — AMOXICILLIN-POT CLAVULANATE 500-125 MG PO TABS
1.0000 | ORAL_TABLET | Freq: Every morning | ORAL | Status: DC
  Filled 2018-12-12: qty 1

## 2018-12-12 MED ORDER — AMOXICILLIN-POT CLAVULANATE 500-125 MG PO TABS
1.0000 | ORAL_TABLET | Freq: Two times a day (BID) | ORAL | Status: DC
  Administered 2018-12-12: 500 mg via ORAL
  Filled 2018-12-12: qty 1

## 2018-12-12 NOTE — Progress Notes (Signed)
Nutrition Follow-up  DOCUMENTATION CODES:  Severe malnutrition in context of acute illness/injury  INTERVENTION:   Transition to Bolus TF: 300 cc Osmolite 1.5 (~1.25 cans) QID- flush with only 30 cc before/after- as needed to maintain tube patency. Continue to provide Prostat 30 ml Q24 hrs.   Regimen will continue to provide 1900 kcals, 90 gm protein, 1154 ml free water daily.  After discussing with MD, will not increase free water flushes at this time. Will also d/c existing order for flushes.   If volume overload continues to be a concern- could consider switching to Nepro.   NUTRITION DIAGNOSIS:  Severe Malnutrition related to acute illness(Acute HF exacerbation/acute renal failure-symptoms 2 months in duration) as evidenced by moderate muscle/fat wasting   Ongoing  GOAL:  Patient will meet greater than or equal to 90% of their needs   Met w/ initiation of TF  MONITOR:  PO intake, Labs, I & O's, Weight trends, Skin, Tf Tolerance  ASSESSMENT:  83 y/o male PMHx CHF, CVA w/ residual dysphagia, CKD4, HLD, HTN. Presented to ED w/ 1-2 month history of DOE. Labs significant for hgb of 6.7 + elevated creatinine. Pt also having s/s consistent w/ fluid overload. Admitted for transfusion and management of AoC CHF & AoC renal failure.   Pt seen in person today. He is confused and can offer minimal information regarding his TF. He does say he feels good right now. He denies any nausea. He does not vocalize any issues relating to TF.   On physical assessment, his abdomen does not feel distended. He demonstrate moderate-severe muscle/fat wasting.   Nursing also denies pt having any issues with TF thus far. To help facilitate transition to snf/therapy, will change to bolus at this time. Also noted that pt is no longer receiving any IVF. After  Discussing concerns of fluid overload/increasing wt w/ MD- Will hold off increasing fluid boluses.   Per discussion w/ ST, pt is to remain npo, with  exception of ice chips following mouth care to help prevent atrophy r/t muscle disuse.   Pt continues to gain weight; pt was documented as being 123 lbs while hospitalized last November for his CVA. This admission, he presented at ~135 lbs, however he also was noted as edematous. He was able to diurese down to 130 lbs, but d/t worsening renal function,  lasix was held 3/21 and his wt  quickly re accumulated:  3/18 (Admit) ~135 lbs 3/20:135.3 lbs 3/21:130.8 3/22:132.7 3/23:137.1 3/24:138.4 3/25: 145.5 3/26: 147.9 lbs  Will use wt of 130 lbs for kcal/pro dosing purposes.    Labs: BG:169, Creat: 4.93>5.14>5.15>5.26>5.34, Albumin:2.4, Hgb: 7.2. K: 4.4-> 4.6 Meds: Ensure Enlive, Free water 120 cc Q 4, prednisone, PPI, po abx. IVF: off  Recent Labs  Lab 12/09/18 0452 12/10/18 0500 12/05/2018 0440 12/12/18 0447  NA 141 141 139 143  K 4.5 4.4 4.7 4.6  CL 114* 115* 115* 113*  CO2 17* 14* 14* 16*  BUN 83* 85* 108* 120*  CREATININE 5.15* 5.26* 5.34* 5.33*  CALCIUM 8.2* 8.4* 8.4* 8.6*  PHOS 7.3* 7.7*  --   --   GLUCOSE 104* 169* 166* 142*   NUTRITION - FOCUSED PHYSICAL EXAM:   Most Recent Value  Orbital Region  Moderate depletion  Upper Arm Region  Moderate depletion  Thoracic and Lumbar Region  Moderate depletion  Buccal Region  Mild depletion  Temple Region  Severe depletion  Clavicle Bone Region  Moderate depletion  Clavicle and Acromion Bone Region  Moderate depletion  Scapular Bone Region  Unable to assess  Dorsal Hand  Mild depletion  Patellar Region  Mild depletion  Anterior Thigh Region  Mild depletion  Posterior Calf Region  Mild depletion  Edema (RD Assessment)  Mild  Hair  Reviewed  Eyes  Reviewed  Mouth  Reviewed  Skin  Reviewed  Nails  Reviewed       Diet Order:   Diet Order    None     EDUCATION NEEDS:  Not appropriate for education at this time  Skin:  Skin Assessment: Reviewed RN Assessment  Last BM: Skin Tear to buttocks, surgical incision to  abdomen  Height:  Ht Readings from Last 1 Encounters:  11/23/2018 '5\' 10"'  (1.778 m)   Weight:  Wt Readings from Last 1 Encounters:  12/12/18 67.1 kg   Wt Readings from Last 10 Encounters:  12/12/18 67.1 kg  08/20/18 63.5 kg  07/22/18 55.8 kg  11/02/12 60.3 kg  07/09/11 59.9 kg  07/02/11 59 kg   Ideal Body Weight:  75.45 kg  BMI:  Body mass index at dosing weight is 18.8 kg/m.  Dosing (59.45 kg) Estimated Nutritional Needs:  Kcal:  1800-1950 (30-33 kcal/kg bw) Protein:  83-95g Pro (1.4-1.6g/kg bw) Fluid:  1.8-2 Liters (1 ml/kcal)  Burtis Junes RD, LDN, CNSC Clinical Nutrition Available Tues-Sat via Pager: [phone] 12/12/2018 1:45 PM

## 2018-12-12 NOTE — Progress Notes (Signed)
Residual amounts from PEG tube for this shift 3/25 2100- 200cc 3/26 0100- 200cc 3/26 0500- 120cc

## 2018-12-12 NOTE — Plan of Care (Signed)

## 2018-12-12 NOTE — Consult Note (Signed)
Consultation Note Date: 12/12/2018   Patient Name: Cameron Boyle  DOB: 01-Jul-1936  MRN: [id number]  Age / Sex: 83 y.o., male  PCP: [name], MD (Inactive) Referring Physician: Rodena Goldmann, DO  Reason for Consultation: Establishing goals of care  HPI/Patient Profile: 83 y.o. 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 Admitted with tachycardia, volume overload, and hemoglobin 6.7. Hospitalization complicated by development of pneumonia, likely aspiration, while undergoing bowel prep and worsening kidney function. GI and nephrology following. Hemoglobin stable. Ongoing dysphagia failed MBS on 12/09/2018. Possibly a candidate for UES dilatation or Botox but due to Covid 19 restrictions, [facility] declined routine/elective transfer. PEG tube placed 12/03/2018. Tolerating tube feeds. ENT outpatient referral. Palliative medicine consultation for goals of care.   Clinical Assessment and Goals of Care:  I have reviewed medical records, discussed with care team, and assessed the patient and then met at the bedside. He is awake, alert, and answers questions appropriately. He does have some forgetfulness and periods of confusion per RN. He denies pain or discomfort. He speaks of not having anything to eat in over a week.   No family at bedside due to Covid 19 visitor restrictions. Shortly after, spoke with wife Cameron Boyle) via telephone.  I introduced Palliative Medicine as specialized medical care for people living with serious illness. It focuses on providing relief from the symptoms and stress of a serious illness. The goal is to improve quality of life for both the patient and the family.  We discussed a brief life review of the patient.  Worked in [employer] fund[employer]. Married for over 55 years. Two sons, one with down-syndrome. Suffered a CVA on October 31st, 2019 and has required ongoing therapy since. Wife speaks of difficulty swallowing and coughing with swallowing since stroke. He was able to return home.   Discussed course of hospitalization including diagnoses and interventions. Updated on recommendations from consulting physicians. Wife speaks of understanding of why PEG tube was placed and plan for f/u with ENT outpatient. She shares that heart and kidney conditions have worsened since the stroke. Explained hope that kidney functions will show improvement after resolution of acute hospitalization but concern that further [place] discussions will need to occur if kidney functions worsen, in regards to dialysis.   I attempted to elicit values and goals of care important to the patient. Advanced directives, concepts specific to code status, artifical feeding and hydration, and rehospitalization were considered and discussed. Educated on medical recommendation against heroic interventions with underlying frailty, deconditioning, chronic conditions, and poor outcomes of CPR/chance of meaningful recovery.   Cameron Boyle shares that her husband has a living will and has spoken of his wishes against prolonged, heroic interventions "if not going to get better." She is documented HCPOA with son Cameron Boyle) as secondary HCPOA. Cameron Boyle asked him earlier about "life support" and he he told her "no, I would  not want to be on life support." Introduced and discussed MOST form in detail. Cameron Boyle feels comfortable completing MOST form with me via telephone.  Patient wishes include: DNR/DNI, limited additional interventions including BiPAP/CPAP and rehospitalization if indicated, IVF and ABX if indicated, and feeding tube for time trial. Explained durable DNR form and completed. Copies made for chart and Hard Choices/MOST copies placed in patient belonging bag for wife.    Reviewed MOST form with patient after completing with wife and he agrees with decisions that she made and confirms he would NOT want to be on a life support machine. Reassured patient and wife that this does not change current plan of care and continuing to treat the treatable.   Wife hopeful he will regain strength at SNF,. She requests [facility] and speaks highly of the caregivers in this facility. She plans to f/u with outpatient ENT when possible.  Answered all questions and concerns. PMT contact information left in Hard Choices booklet.    SUMMARY OF RECOMMENDATIONS    Good initial [place] with patient and wife via telephone.   Patient has a documented living will and HCPOA. Patient/wife share that he would not want to be placed on life support machine. After further discussion, MOST form completed. Patient/wife wishes include DNR/DNI, limited additional interventions including rehospitalization/BiPAP/CPAP if indicated, IVF/ABX if indicated, and feeding tube for time trial. Durable DNR completed. Copies made for chart and wife. Hard choices copy placed in patient belonging bag.  Continue current plan of care and medical management.   Wife hopeful for improvement in his functional status with rehab.   Wife plans to f/u outpatient with ENT in regards to options and next steps.   May benefit from outpatient palliative referral but unfortunately limited in [facility].   Code Status/Advance Care Planning:  DNR  Symptom Management:   Per attending  Palliative Prophylaxis:   Aspiration, Delirium Protocol, Oral Care and Turn Reposition  Additional Recommendations (Limitations, Scope, Preferences):  DNR/DNI, otherwise continue full scope treatment  Psycho-social/Spiritual:   Desire for further Chaplaincy support:yes  Additional Recommendations: Caregiving  Support/Resources and Compassionate Wean Education  Prognosis:   Unable to determine  Discharge Planning:  SNF for rehab     Primary Diagnoses: Present on Admission: . Acute blood loss anemia   I have reviewed the medical record, interviewed the patient and family, and examined the patient. The following aspects are pertinent.  Past Medical History:  Diagnosis Date  . Arthritis    left knee  . Stroke [facility])    07/18/18   Social History   Socioeconomic History  . Marital status: Married    Spouse name: Not on file  . Number of children: Not on file  . Years of education: Not on file  . Highest education level: Not on file  Occupational History  . Not on file  Social Needs  . Financial resource strain: Not on file  . Food insecurity:    Worry: Not on file    Inability: Not on file  . Transportation needs:    Medical: Not on file    Non-medical: Not on file  Tobacco Use  . Smoking status: Former Research scientist (life sciences)  . Smokeless tobacco: Never Used  Substance and Sexual Activity  . Alcohol use: No  . Drug use: No  . Sexual activity: Not on file  Lifestyle  . Physical activity:    Days per week: Not on file    Minutes per session: Not on file  .  Stress: Not on file  Relationships  . Social connections:    Talks on phone: Not on file    Gets together: Not on file    Attends religious service: Not on file    Active member of club or organization: Not on file    Attends meetings of clubs or organizations: Not on file    Relationship status: Not on file  Other Topics Concern  . Not on file  Social History Narrative  . Not on file   History reviewed. No pertinent family history. Scheduled Meds: . albuterol  2.5 mg Nebulization TID  . amLODipine  10 mg Oral Daily  . amoxicillin-clavulanate  1 tablet Oral BID  . aspirin  81 mg Oral Daily  . atorvastatin  20 mg Oral q1800  . feeding supplement (OSMOLITE 1.5 CAL)  300 mL Per Tube QID  . feeding supplement (PRO-STAT SUGAR FREE 64)  30 mL Per Tube Daily  . guaiFENesin  600 mg Oral BID  . hydrALAZINE  25 mg Oral Q8H  .  pantoprazole  40 mg Oral BID AC  . predniSONE  40 mg Oral Q breakfast  . sodium bicarbonate  1,300 mg Oral BID  . sodium chloride flush  3 mL Intravenous Q12H   Continuous Infusions: . sodium chloride    . sodium chloride 20 mL/hr at 12/07/2018 2038   PRN Meds:.sodium chloride, sodium chloride, acetaminophen, guaiFENesin-dextromethorphan, haloperidol lactate, hydrALAZINE, HYDROcodone-acetaminophen, morphine injection, ondansetron (ZOFRAN) IV, Resource ThickenUp Clear, sodium chloride flush Medications Prior to Admission:  Prior to Admission medications   Medication Sig Start Date End Date Taking? Authorizing Provider  amLODipine (NORVASC) 10 MG tablet Take 1 tablet (10 mg total) by mouth daily. 07/23/18  Yes Donzetta Starch, NP  atorvastatin (LIPITOR) 20 MG tablet Take 1 tablet (20 mg total) by mouth daily at 6 PM. 07/22/18  Yes Donzetta Starch, NP  hydrALAZINE (APRESOLINE) 25 MG tablet Take 1 tablet (25 mg total) by mouth every 8 (eight) hours. Patient taking differently: Take 50 mg by mouth every 8 (eight) hours.  07/22/18  Yes Donzetta Starch, NP  Multiple Vitamins-Minerals (MENS MULTIPLUS PO) Take 1 tablet by mouth daily.     Yes [provider]  clopidogrel (PLAVIX) 75 MG tablet Take 1 tablet (75 mg total) by mouth daily. Patient not taking: Reported on 11/27/2018 07/23/18   Donzetta Starch, NP  HYDROcodone-acetaminophen [facility]) 5-325 MG per tablet 1/2 to 1 tablet by mouth every 4 hours prn pain Patient not taking: Reported on 12/10/2018 07/02/11   Evalee Jefferson, PA-C   No Known Allergies Review of Systems  Constitutional: Positive for activity change and appetite change.  HENT:       Difficulty swallowing/coughing with swallowing  Neurological: Positive for weakness.   Physical Exam Vitals signs and nursing note reviewed.  Constitutional:      General: He is awake.  HENT:     Head: Normocephalic and atraumatic.  Cardiovascular:     Rate and Rhythm: Normal rate.  Pulmonary:      Effort: No tachypnea, accessory muscle usage or respiratory distress.  Abdominal:     Comments: PEG tube  Skin:    General: Skin is warm and dry.  Neurological:     Mental Status: He is alert and oriented to person, place, and time.     Comments: Forgetful, periods of confusion  Psychiatric:        Mood and Affect: Mood normal.  Speech: Speech normal.        Behavior: Behavior normal.    Vital Signs: BP (!) 145/64 (BP Location: Left Arm)   Pulse 84   Temp 98.3 F (36.8 C)   Resp (!) 21   Ht _0  (1.778 m)   Wt 67.1 kg   SpO2 90%   BMI 21.23 kg/m  Pain Scale: 0-10 POSS *See Group Information*: 1-Acceptable,Awake and alert Pain Score: 3    SpO2: SpO2: 90 % O2 Device:SpO2: 90 % O2 Flow Rate: .O2 Flow Rate (L/min): 6 L/min  IO: Intake/output summary:   Intake/Output Summary (Last 24 hours) at 12/12/2018 1444 Last data filed at 12/12/2018 1300 Gross per 24 hour  Intake 814.56 ml  Output 1450 ml  Net -635.44 ml    LBM: Last BM Date: 12/14/2018 Baseline Weight: Weight: 62.1 kg Most recent weight: Weight: 67.1 kg     Palliative Assessment/Data: PPS 40%   Flowsheet Rows     Most Recent Value  Intake Tab  Referral Department  Hospitalist  Unit at Time of Referral  Med/Surg Unit  Palliative Care Primary Diagnosis  Sepsis/Infectious Disease  Palliative Care Type  New Palliative care  Reason for referral  Clarify Goals of Care  Date first seen by Palliative Care  12/12/18  Clinical Assessment  Palliative Performance Scale Score  40%  Psychosocial & Spiritual Assessment  Palliative Care Outcomes  Patient/Family meeting held?  Yes  Who was at the meeting?  patient and wife  Palliative Care Outcomes  Clarified goals of care, Provided end of life care assistance, Provided psychosocial or spiritual support, ACP counseling assistance, Changed CPR status, Provided advance care planning, Completed durable DNR, Linked to palliative care logitudinal support      Time  In: 1300 Time Out: 1425 Time Total: 30mn Greater than 50%  of this time was spent counseling and coordinating care related to the above assessment and plan.  Signed by:  MIhor Dow FNP-C Palliative Medicine Team  Phone: [phone]: [phone]  Please contact Palliative Medicine Team phone at [phone] questions and concerns.  For individual provider: See AShea Evans

## 2018-12-12 NOTE — Progress Notes (Signed)
Pharmacy Antibiotic Note  Cameron Boyle is a 83 y.o. male admitted on 11/28/2018 with aspiration pneumonia.  Pharmacy has been consulted for Unasyn dosing.  Plan: Unasyn 3gm IV q24h F/U cxs and clinical progress Monitor V/S and labs  Height: 5\' 10"  (177.8 cm) Weight: 147 lb 14.9 oz (67.1 kg) IBW/kg (Calculated) : 73  Temp (24hrs), Avg:97.3 F (36.3 C), Min:96.5 F (35.8 C), Max:97.6 F (36.4 C)  Recent Labs  Lab 12/07/18 0510 12/08/18 0641 12/09/18 0452 12/10/18 0500 12/13/2018 0440 12/12/18 0447  WBC 9.5  --  9.8 9.7 11.1* 12.7*  CREATININE 4.93* 5.14* 5.15* 5.26* 5.34* 5.33*    Estimated Creatinine Clearance: 10.5 mL/min (A) (by C-G formula based on SCr of 5.33 mg/dL (H)).    No Known Allergies  Antimicrobials this admission: Unasyn 3/23>>  Ceftriaxone 3/20>> 3/23 Azithromycin 3/20>> 3/23  Dose adjustments this admission: n/a  Microbiology results: MRSA PCR:   Thank you for allowing pharmacy to be a part of this patient's care.  Thomasenia Sales, PharmD, MBA, BCGP Clinical Pharmacist  12/12/2018 8:10 AM

## 2018-12-12 NOTE — Progress Notes (Signed)
PROGRESS NOTE    Cameron Boyle  PZW:[id number] DOB: 05/28/1936 DOA: 11/25/2018 PCP: [name], MD (Inactive)   Brief Narrative:  Per HPI: 83 y.o.malewith medical history ofdiastolic dysfunction, right MCA stroke, hypertension, hyperlipidemia, CKD stage IV presenting with 1 to 64-month history of dyspnea on exertion.Admitted on 11/28/2018 with tachycardia, volume overload symptoms as well as hemoglobin dropping down to 6.7, improved posttransfusion, developed pneumonia while undergoing bowel prep, query aspiration--- Failed MBS on 12/09/18--please see full report... Pt would be a candidate for UES dilation or Botox and try dysphagia therapy again following. In the meantime would likely need PEG tube to maintain nutritional/hydration needs.  Patient has received a PEG tube on 3/25 and appears to be feeling quite weak, but maintains adequate urine output.  Nephrology consulted with plans for gentle diuresis today.  Assessment & Plan:   Active Problems:   Acute blood loss anemia   Acute on chronic diastolic CHF (congestive heart failure) (HCC)   Acute renal failure superimposed on stage 4 chronic kidney disease (HCC)   Transfusion-dependent anemia   Cough   Symptomatic anemia   Aspiration pneumonia due to regurgitated food ([place])   Dysphagia  1. Acute on chronic blood loss anemia secondary to acute GI bleed and CKD.  Patient continues to maintain stable hemoglobin levels and will recheck CBC in a.m.  He has received Feraheme due to iron deficiency and appreciate GI consultation.  Will transfuse for any further drops in hemoglobin.  Appreciate Nephrology with [facility] and Aranesp on 3/25 .  No overt bleeding identified. 2. Bilateral pneumonia likely secondary to aspiration.  Transition to Augmentin today. 3. Dysphagia.  Patient has had chronic issues with this and may be related to prior stroke.  He is now status post PEG tube placement with tube feeds well tolerated and moderate  residuals. Will elevate head of bed and binder removed by GS today. Discussed withDr. [name], ENT at [facility] [phone] and [phone]) to see if patient is a candidate for UES dilatation, [name] states that due to COVID 19 transfer restrictions they cannot accept routine/elective transfers at this time ... Patient may see ENT as outpatient down the road in the meantime patient will most likely need PEG tube in order to meet his nutritional/hydration needs...... N.p.o. for now... May have medications crushed in applesauce until PEG tube is placed-- Per [name] --possible PEG tube placement on 12/17/2018 4. Acute on chronic diastolic CHF.  Lasix 20mg  IV Lasix ordered by Nephrology today due to concerns of hypoxemia that is worsening. Will check CXR repeat today as well. 5. AKI on CKD stage IV. Patient is otherwise nonoliguric. Patient started on bicarb and renal US without and acute findings.  Appreciate nephrology consultation. 6. Essential hypertension-stable.  Continue amlodipine and hydralazine. 7. Dyslipidemia.  Continue statin. 8. History of right MCA stroke.  Continue on aspirin 81 mg for now and plan to restart Plavix in a.m. 3/26. 9. Right rotator cuff injury.  X-rays with no significant findings continue prednisone for 2 more days. 10. Acute hypoxemic respiratory failure likely secondary to aspiration pneumonia with underlying diastolic congestive heart failure.  Wean oxygen as tolerated; to try Lasix today and recheck CXR.   DVT prophylaxis: SCDs Code Status: Full Family Communication: Discussed with wife on phone Disposition Plan: Nephrology evaluation today.  Plan to start tube feeds and will transition to oral Augmentin once tube feeds are tolerated by a.m.  Anticipate discharge to SNF when bed available.  Consultants:   General surgery  Nephrology  Procedures:   PEG tube placement 3/25  Antimicrobials:   Unasyn  3/23->3/26  Augmentin 3/26->  Subjective: Patient seen and evaluated today with no new acute complaints or concerns. No acute concerns or events noted overnight. Abdominal binder removed with some relief to chest and abdomen noted. He did have some periods of agitation yesterday. Continues to have good urine output and moderate residuals with tube feeding.  Objective: Vitals:   12/12/18 0500 12/12/18 0603 12/12/18 0608 12/12/18 0815  BP:  (!) 142/60    Pulse:  84 77   Resp:  20    Temp:  97.6 F (36.4 C)    TempSrc:  Oral    SpO2:  (!) 84% 91% 90%  Weight: 67.1 kg     Height:        Intake/Output Summary (Last 24 hours) at 12/12/2018 1013 Last data filed at 12/12/2018 0900 Gross per 24 hour  Intake 814.56 ml  Output 1450 ml  Net -635.44 ml   Filed Weights   12/10/18 0500 12/10/2018 0644 12/12/18 0500  Weight: 62.8 kg 66 kg 67.1 kg    Examination:  General exam: Appears calm and comfortable  Respiratory system: Clear to auscultation. Respiratory effort normal. Currently on 3L [place]. Cardiovascular system: S1 & S2 heard, RRR. No JVD, murmurs, rubs, gallops or clicks. No pedal edema. Gastrointestinal system: Abdomen is nondistended, soft and nontender. No organomegaly or masses felt. Normal bowel sounds heard. PEG c/d/i. Central nervous system: Alert and oriented. No focal neurological deficits. Extremities: Symmetric 5 x 5 power. Skin: No rashes, lesions or ulcers Psychiatry: Judgement and insight appear normal. Mood & affect appropriate.     Data Reviewed: I have personally reviewed following labs and imaging studies  CBC: Recent Labs  Lab 12/07/18 0510 12/09/18 0452 12/10/18 0500 12/05/2018 0440 12/12/18 0447  WBC 9.5 9.8 9.7 11.1* 12.7*  HGB 7.2* 7.0* 7.2* 7.4* 7.5*  HCT 23.1* 23.4* 23.5* 24.4* 24.8*  MCV 93.1 94.0 92.5 93.1 93.2  PLT 151 167 181 226 683   Basic Metabolic Panel: Recent Labs  Lab 12/08/18 0641 12/09/18 0452 12/10/18 0500 12/09/2018 0440  12/12/18 0447  NA 139 141 141 139 143  K 4.4 4.5 4.4 4.7 4.6  CL 111 114* 115* 115* 113*  CO2 17* 17* 14* 14* 16*  GLUCOSE 111* 104* 169* 166* 142*  BUN 85* 83* 85* 108* 120*  CREATININE 5.14* 5.15* 5.26* 5.34* 5.33*  CALCIUM 8.0* 8.2* 8.4* 8.4* 8.6*  PHOS  --  7.3* 7.7*  --   --    GFR: Estimated Creatinine Clearance: 10.5 mL/min (A) (by C-G formula based on SCr of 5.33 mg/dL (H)). Liver Function Tests: Recent Labs  Lab 12/09/18 0452 12/10/18 0500 11/17/2018 0440  AST 47*  --  38  ALT 27  --  34  ALKPHOS 74  --  85  BILITOT 0.5  --  0.4  PROT 6.1*  --  6.2*  ALBUMIN 2.5*   2.5* 2.4* 2.4*   Recent Labs  Lab 12/09/18 0452  LIPASE 32   No results for input(s): AMMONIA in the last 168 hours. Coagulation Profile: No results for input(s): INR, PROTIME in the last 168 hours. Cardiac Enzymes: No results for input(s): CKTOTAL, CKMB, CKMBINDEX, TROPONINI in the last 168 hours. BNP (last 3 results) No results for input(s): PROBNP in the last 8760 hours. HbA1C: No results for input(s): HGBA1C in the last 72 hours. CBG: No results for input(s):  GLUCAP in the last 168 hours. Lipid Profile: No results for input(s): CHOL, HDL, LDLCALC, TRIG, CHOLHDL, LDLDIRECT in the last 72 hours. Thyroid Function Tests: No results for input(s): TSH, T4TOTAL, FREET4, T3FREE, THYROIDAB in the last 72 hours. Anemia Panel: No results for input(s): VITAMINB12, FOLATE, FERRITIN, TIBC, IRON, RETICCTPCT in the last 72 hours. Sepsis Labs: No results for input(s): PROCALCITON, LATICACIDVEN in the last 168 hours.  No results found for this or any previous visit (from the past 240 hour(s)).       Radiology Studies: US Renal  Result Date: 11/29/2018 CLINICAL DATA:  Chronic renal disease. EXAM: RENAL / URINARY TRACT ULTRASOUND COMPLETE COMPARISON:  None. FINDINGS: Right Kidney: Renal measurements: 10.3 x 4.5 x 4.3 cm = volume: 102.0 mL. Cortical echogenicity is increased. No solid mass or  hydronephrosis visualized. Cysts off the upper pole measuring 2.3 cm in diameter noted. Right pleural effusion is identified. Left Kidney: Renal measurements: 9.9 x 7.4 x 4.0 cm = volume: 154 mL. Echogenicity within normal limits. Cortical echogenicity is increased. No solid mass or hydronephrosis visualized. 5.8 cm cyst off the mid and upper pole noted. Bladder: Appears normal for degree of bladder distention. IMPRESSION: No acute abnormality.  Negative for hydronephrosis. Increased cortical echogenicity bilaterally consistent with medical renal disease. Single bilateral renal cysts. Right pleural effusion. Electronically Signed   By: Inge Rise M.D.   On: 11/21/2018 14:20        Scheduled Meds:  albuterol  2.5 mg Nebulization TID   amLODipine  10 mg Oral Daily   amoxicillin-clavulanate  1 tablet Oral q morning - 10a   aspirin  81 mg Oral Daily   atorvastatin  20 mg Oral q1800   feeding supplement (PRO-STAT SUGAR FREE 64)  30 mL Per Tube Daily   free water  120 mL Per Tube Q4H   furosemide  20 mg Intravenous Once   guaiFENesin  600 mg Oral BID   hydrALAZINE  25 mg Oral Q8H   pantoprazole  40 mg Oral BID AC   predniSONE  40 mg Oral Q breakfast   sodium bicarbonate  1,300 mg Oral BID   sodium chloride flush  3 mL Intravenous Q12H   Continuous Infusions:  sodium chloride     sodium chloride 20 mL/hr at 12/05/2018 2038   feeding supplement (OSMOLITE 1.5 CAL) 50 mL/hr at 12/12/18 0030     LOS: 8 days    Time spent: 30 minutes    Draven Natter Darleen Crocker, DO [facility] Pager [phone]  If 7PM-7AM, please contact night-coverage [email or web address] Password [facility] 12/12/2018, 10:13 AM

## 2018-12-12 NOTE — Progress Notes (Addendum)
PT Cancellation Note  Patient Details Name: Cameron Boyle MRN: [id number] DOB: 1936/04/25   Cancelled Treatment:    Reason Eval/Treat Not Completed: Patient declined, no reason specified  Attempted PT session.  Pt supine in bed and stated he had the roughest night last night, was limited by pain following surgery last night.  No reports of current pain, is tired and wishes to stay in bed.  Will attempted therapy later today if available.  Pt remained in bed with call bell within reach.  At 10:07:  Multiple attempts complete to complete therapy, pt continues to refuse therapy due to rough night following surgery yesterday.  Pt left in bed with call bell within reach.  [address] LPTA; [place]  Aldona Lento 12/12/2018, 8:45 AM

## 2018-12-12 NOTE — Progress Notes (Signed)
1 Day Post-Op  Subjective: No significant abdominal pain.  No nausea or vomiting noted.  Objective: Vital signs in last 24 hours: Temp:  [96.5 F (35.8 C)-97.6 F (36.4 C)] 97.6 F (36.4 C) (03/26 0603) Pulse Rate:  [77-87] 77 (03/26 0608) Resp:  [15-20] 20 (03/26 0603) BP: (114-143)/(60-94) 142/60 (03/26 0603) SpO2:  [84 %-100 %] 90 % (03/26 0815) Weight:  [67.1 kg] 67.1 kg (03/26 0500) Last BM Date: 11/21/2018  Intake/Output from previous day: 03/25 0701 - 03/26 0700 In: 1464.6 [I.V.:679.6; NG/GT:485; IV Piggyback:300] Out: 1450 [Urine:1450] Intake/Output this shift: No intake/output data recorded.  General appearance: alert, cooperative and no distress GI: soft, non-tender; bowel sounds normal; no masses,  no organomegaly and PEG site clean and dry.  Lab Results:  Recent Labs    12/06/2018 0440 12/12/18 0447  WBC 11.1* 12.7*  HGB 7.4* 7.5*  HCT 24.4* 24.8*  PLT 226 229   BMET Recent Labs    12/03/2018 0440 12/12/18 0447  NA 139 143  K 4.7 4.6  CL 115* 113*  CO2 14* 16*  GLUCOSE 166* 142*  BUN 108* 120*  CREATININE 5.34* 5.33*  CALCIUM 8.4* 8.6*   PT/INR No results for input(s): LABPROT, INR in the last 72 hours.  Studies/Results: US Renal  Result Date: 11/29/2018 CLINICAL DATA:  Chronic renal disease. EXAM: RENAL / URINARY TRACT ULTRASOUND COMPLETE COMPARISON:  None. FINDINGS: Right Kidney: Renal measurements: 10.3 x 4.5 x 4.3 cm = volume: 102.0 mL. Cortical echogenicity is increased. No solid mass or hydronephrosis visualized. Cysts off the upper pole measuring 2.3 cm in diameter noted. Right pleural effusion is identified. Left Kidney: Renal measurements: 9.9 x 7.4 x 4.0 cm = volume: 154 mL. Echogenicity within normal limits. Cortical echogenicity is increased. No solid mass or hydronephrosis visualized. 5.8 cm cyst off the mid and upper pole noted. Bladder: Appears normal for degree of bladder distention. IMPRESSION: No acute abnormality.  Negative for  hydronephrosis. Increased cortical echogenicity bilaterally consistent with medical renal disease. Single bilateral renal cysts. Right pleural effusion. Electronically Signed   By: Inge Rise M.D.   On: 12/14/2018 14:20    Anti-infectives: Anti-infectives (From admission, onward)   Start     Dose/Rate Route Frequency Ordered Stop   12/09/18 1800  Ampicillin-Sulbactam (UNASYN) 3 g in sodium chloride 0.9 % 100 mL IVPB     3 g 200 mL/hr over 30 Minutes Intravenous Every 24 hours 12/09/18 1727     12/09/2018 0930  azithromycin (ZITHROMAX) 500 mg in sodium chloride 0.9 % 250 mL IVPB  Status:  Discontinued     500 mg 250 mL/hr over 60 Minutes Intravenous Every 24 hours 12/09/2018 0752 12/09/18 1648   12/14/2018 0900  cefTRIAXone (ROCEPHIN) 1 g in sodium chloride 0.9 % 100 mL IVPB  Status:  Discontinued     1 g 200 mL/hr over 30 Minutes Intravenous Every 24 hours 11/22/2018 0752 12/09/18 1648      Assessment/Plan: s/p Procedure(s): ESOPHAGOGASTRODUODENOSCOPY (EGD) WITH PROPOFOL (scope in 0919; scope out 0925) PERCUTANEOUS ENDOSCOPIC GASTROSTOMY (PEG) PLACEMENT Impression: Stable from surgical standpoint.  Residuals may be slightly higher due to his azotemia.  Would suggest elevating the head of the bed greater than 30 degrees.  Will sign off.  Please call me if I can be of further assistance.  LOS: 8 days    Aviva Signs 12/12/2018

## 2018-12-12 NOTE — Progress Notes (Signed)
  Speech Language Pathology Treatment: Dysphagia  Patient Details Name: Cameron Boyle MRN: [id number] DOB: Jul 23, 1936 Today's Date: 12/12/2018 Time: 9201-0071 SLP Time Calculation (min) (ACUTE ONLY): 27 min  Assessment / Plan / Recommendation Clinical Impression  Pt seen at bedside for ongoing dysphagia intervention. He had PEG placed yesterday. Pt is well known to this SLP from previous outpatient dysphagia therapy (February 2020) and he is slightly disoriented this AM. He stated that he was at home and that his wife was "around here somewhere with her sister". Pt reoriented to place, time, and situation. Oral care completed and Pt presented with single ice chips over 6 trials each yielding strong, congested coughing. Pt was able to expectorate thick, blood tinged secretions into a cup and vocal quality improved somewhat. Pt also recalled vocal adduction exercises previously taught in outpatient therapy and initiated on his own. We attempted to call his wife today x5 over 25 minutes, however busy signal only. Recommend HH SLP for ice chip trial after oral care and f/u with ENT for UES dysfunction.    HPI HPI: 83 year old male presenting for symptomaticnormocyticanemia. Hospitalized back in November with cryptogenic stroke, initially on dual therapy with Plavix and aspirin and subsequently on Plavix only. At time of discharge on November 4 thhis hemoglobin was 10.4. Presented yesterday with a hemoglobin of 6.7. Had noted progressive weakness, lower extremity edema but no overt melena, rectal bleeding. Stool heme-negative here. Received 1 unit of packed red blood cells, 1 dose of Feraheme. Remains on Plavix. Hemoglobin up to 7.9 yesterday and drift to 7.5 this morning.Suspected occult GI bleeding in the setting of Plavix. Planned colonoscopy/EGD today, however overnight the patient began having significant coughing with GoLytely prep. Chest XRay showed patchy bilateral airspace worsening since  prior study concerning for pneumonia. Concerning for aspiration pna and patient unable to complete bowel prep so TCS/EGD was cancelled. BSE requested. Pt is known to this SLP from [place] (09/26/18) and outpatient dysphagia therapy for mod/severe sensorimotor based pharyngeal dysphagia. Pt was discharged from outpatient therapy in February with recommendation to continue HEP indefinitely (Pt with reduced awareness of severity of dysphagia).      SLP Plan  Continue with current plan of care       Recommendations  Diet recommendations: NPO Medication Administration: Via alternative means                Oral Care Recommendations: Oral care prior to ice chip/H20;Staff/trained caregiver to provide oral care Follow up Recommendations: Home health SLP SLP Visit Diagnosis: Dysphagia, pharyngoesophageal phase (R13.14) Plan: Continue with current plan of care       Thank you,  [name] Churn, [place]              [place] 12/12/2018, 12:11 PM

## 2018-12-12 NOTE — Care Management (Addendum)
Patient notified of bed offers, he selects [facility]. Not ready for DC today.

## 2018-12-12 NOTE — Progress Notes (Signed)
[facility] Progress Note    Assessment/ Plan:   83 year old male with progressive CKD 4, likely an acute component related to his anemia and aspiration pneumonia.  No indications immediately for RRT however GFR is very low.  1. CKD4 with progression, likely AKI- likely in setting of aspiration pneumonia and anemia.  Renal US without hydronephrosis, UA bland, and SPEP/serum free light chains pending.  If nothing is reversible he is a suboptimal candidate for RRT and we will need to continue discussions. 2. Anemia, with IDA TSAT 5% and Ferritin 242, also related to #1; rec 510mg  IV Madilyn Hook 3/18; also got feraheme and Aranesp yesterday as well 3. NAG metabolic Acidosis- improving with oral bicarb 4. Aspiration PNA switched from Unasyn --> augmentin 5. Progressive debility 6. Hypervolemia / LEE--> given increased O2 requirement I think we have to give at least a small dose of Lasix today.  Will be as judicious as possible to minimize impact on hemodyamics and azotemia. 7. PCM, hypoalbuminemia 8. HTN on CCB and hydralazine 9. R rotator cuff pain, on prednisone 10. Dysphagia s/p PEG 12/14/2018--> now on TF with FWF 11. Dispo: SNF, pending improvement in renal function    Subjective:    S/p PEG yesterday, up and down last night and couldn't sleep well d/t pain.  O2 requirement increased overnight from 3 --> 6L.  UOP more robust yesterday.  Weights up.   Objective:   BP (!) 142/60 (BP Location: Left Arm)   Pulse 77   Temp 97.6 F (36.4 C) (Oral)   Resp 20   Ht 5\' 10"  (1.778 m)   Wt 67.1 kg   SpO2 90%   BMI 21.23 kg/m   Intake/Output Summary (Last 24 hours) at 12/12/2018 0944 Last data filed at 12/12/2018 0900 Gross per 24 hour  Intake 864.56 ml  Output 1450 ml  Net -585.44 ml   Weight change: 1.1 kg  Physical Exam: Gen: older man, NAD, sleeping and easily arousable NECK: + JVD to angle of mandible CVS: RRR II/VI systolic murmur Resp: bilateral diffuse  inspiratory rhonchi and crackles Abd: soft, sl tender, PEG in place without erythema/ drainage Ext: 1+ LE edema MSK: sarcopenic NEURO: L-sided deficits  Imaging: US Renal  Result Date: 12/10/2018 CLINICAL DATA:  Chronic renal disease. EXAM: RENAL / URINARY TRACT ULTRASOUND COMPLETE COMPARISON:  None. FINDINGS: Right Kidney: Renal measurements: 10.3 x 4.5 x 4.3 cm = volume: 102.0 mL. Cortical echogenicity is increased. No solid mass or hydronephrosis visualized. Cysts off the upper pole measuring 2.3 cm in diameter noted. Right pleural effusion is identified. Left Kidney: Renal measurements: 9.9 x 7.4 x 4.0 cm = volume: 154 mL. Echogenicity within normal limits. Cortical echogenicity is increased. No solid mass or hydronephrosis visualized. 5.8 cm cyst off the mid and upper pole noted. Bladder: Appears normal for degree of bladder distention. IMPRESSION: No acute abnormality.  Negative for hydronephrosis. Increased cortical echogenicity bilaterally consistent with medical renal disease. Single bilateral renal cysts. Right pleural effusion. Electronically Signed   By: Inge Rise M.D.   On: 11/24/2018 14:20    Labs: BMET Recent Labs  Lab 11/28/2018 0426 12/07/18 0510 12/08/18 0641 12/09/18 0452 12/10/18 0500 12/03/2018 0440 12/12/18 0447  NA 142 138 139 141 141 139 143  K 4.6 4.0 4.4 4.5 4.4 4.7 4.6  CL 114* 111 111 114* 115* 115* 113*  CO2 17* 17* 17* 17* 14* 14* 16*  GLUCOSE 98 138* 111* 104* 169* 166* 142*  BUN 90* 87* 85* 83*  85* 108* 120*  CREATININE 4.66* 4.93* 5.14* 5.15* 5.26* 5.34* 5.33*  CALCIUM 8.3* 7.9* 8.0* 8.2* 8.4* 8.4* 8.6*  PHOS  --   --   --  7.3* 7.7*  --   --    CBC Recent Labs  Lab 12/09/18 0452 12/10/18 0500 11/18/2018 0440 12/12/18 0447  WBC 9.8 9.7 11.1* 12.7*  HGB 7.0* 7.2* 7.4* 7.5*  HCT 23.4* 23.5* 24.4* 24.8*  MCV 94.0 92.5 93.1 93.2  PLT 167 181 226 229    Medications:    . albuterol  2.5 mg Nebulization TID  . amLODipine  10 mg Oral Daily   . amoxicillin-clavulanate  1 tablet Oral q morning - 10a  . aspirin  81 mg Oral Daily  . atorvastatin  20 mg Oral q1800  . feeding supplement (PRO-STAT SUGAR FREE 64)  30 mL Per Tube Daily  . free water  120 mL Per Tube Q4H  . guaiFENesin  600 mg Oral BID  . hydrALAZINE  25 mg Oral Q8H  . pantoprazole  40 mg Oral BID AC  . predniSONE  40 mg Oral Q breakfast  . sodium bicarbonate  1,300 mg Oral BID  . sodium chloride flush  3 mL Intravenous Q12H      Madelon Lips MD [facility] pgr [phone] 12/12/2018, 9:44 AM

## 2018-12-13 LAB — BASIC METABOLIC PANEL
Anion gap: 13 (ref 5–15)
BUN: 134 mg/dL — ABNORMAL HIGH (ref 8–23)
CHLORIDE: 113 mmol/L — AB (ref 98–111)
CO2: 18 mmol/L — ABNORMAL LOW (ref 22–32)
Calcium: 8.6 mg/dL — ABNORMAL LOW (ref 8.9–10.3)
Creatinine, Ser: 5.16 mg/dL — ABNORMAL HIGH (ref 0.61–1.24)
GFR calc Af Amer: 11 mL/min — ABNORMAL LOW (ref >60)
GFR calc non Af Amer: 10 mL/min — ABNORMAL LOW (ref >60)
Glucose, Bld: 90 mg/dL (ref 70–99)
Potassium: 4.8 mmol/L (ref 3.5–5.1)
Sodium: 144 mmol/L (ref 135–145)

## 2018-12-13 LAB — CBC
HCT: 27.9 % — ABNORMAL LOW (ref 39.0–52.0)
Hemoglobin: 8.3 g/dL — ABNORMAL LOW (ref 13.0–17.0)
MCH: 27.8 pg (ref 26.0–34.0)
MCHC: 29.7 g/dL — ABNORMAL LOW (ref 30.0–36.0)
MCV: 93.3 fL (ref 80.0–100.0)
Platelets: 266 10*3/uL (ref 150–400)
RBC: 2.99 MIL/uL — ABNORMAL LOW (ref 4.22–5.81)
RDW: 14.4 % (ref 11.5–15.5)
WBC: 17.3 10*3/uL — ABNORMAL HIGH (ref 4.0–10.5)
nRBC: 0.2 % (ref 0.0–0.2)

## 2018-12-13 MED ORDER — ACETYLCYSTEINE 20 % IN SOLN
3.0000 mL | Freq: Two times a day (BID) | RESPIRATORY_TRACT | Status: DC
  Administered 2018-12-13 – 2018-12-15 (×5): 3 mL via RESPIRATORY_TRACT
  Filled 2018-12-13 (×4): qty 4

## 2018-12-13 MED ORDER — ACETYLCYSTEINE 10 % IN SOLN
4.0000 mL | Freq: Two times a day (BID) | RESPIRATORY_TRACT | Status: DC
  Filled 2018-12-13 (×7): qty 4

## 2018-12-13 MED ORDER — ACETYLCYSTEINE 20 % IN SOLN
RESPIRATORY_TRACT | Status: AC
  Administered 2018-12-13: 3 mL via RESPIRATORY_TRACT
  Filled 2018-12-13: qty 4

## 2018-12-13 MED ORDER — SODIUM CHLORIDE 0.9 % IV SOLN
3.0000 g | INTRAVENOUS | Status: DC
  Administered 2018-12-13 – 2018-12-15 (×3): 3 g via INTRAVENOUS
  Filled 2018-12-13 (×4): qty 3

## 2018-12-13 MED ORDER — PANTOPRAZOLE SODIUM 40 MG PO PACK
40.0000 mg | PACK | Freq: Two times a day (BID) | ORAL | Status: DC
  Administered 2018-12-13 – 2018-12-15 (×5): 40 mg
  Filled 2018-12-13 (×5): qty 20

## 2018-12-13 MED ORDER — FUROSEMIDE 10 MG/ML IJ SOLN
20.0000 mg | Freq: Once | INTRAMUSCULAR | Status: AC
  Administered 2018-12-13: 20 mg via INTRAVENOUS
  Filled 2018-12-13: qty 2

## 2018-12-13 NOTE — Progress Notes (Signed)
Pharmacy Antibiotic Note  Cameron Boyle is a 83 y.o. male admitted on 12/03/2018 with aspiration pneumonia.  Pharmacy has been consulted for Unasyn dosing.  Plan: Unasyn 3gm IV q24h F/U cxs and clinical progress Monitor V/S and labs  Height: 5\' 10"  (177.8 cm) Weight: 147 lb 14.9 oz (67.1 kg) IBW/kg (Calculated) : 73  Temp (24hrs), Avg:98.1 F (36.7 C), Min:97.7 F (36.5 C), Max:98.3 F (36.8 C)  Recent Labs  Lab 12/09/18 0452 12/10/18 0500 11/18/2018 0440 12/12/18 0447 12/13/18 0428  WBC 9.8 9.7 11.1* 12.7* 17.3*  CREATININE 5.15* 5.26* 5.34* 5.33* 5.16*    Estimated Creatinine Clearance: 10.8 mL/min (A) (by C-G formula based on SCr of 5.16 mg/dL (H)).    No Known Allergies  Antimicrobials this admission: Unasyn 3/23>> 3/26 changed to augmentin, 3/27 restarted Unasyn  Ceftriaxone 3/20>> 3/23 Azithromycin 3/20>> 3/23  Dose adjustments this admission: n/a  Microbiology results: no cultures   Thank you for allowing pharmacy to be a part of this patient's care.  Thomasenia Sales, PharmD, MBA, BCGP Clinical Pharmacist  12/13/2018 8:17 AM

## 2018-12-13 NOTE — Progress Notes (Signed)
Patient very shortness of breath, and anxious. Patient given PRN haldool and morphine.

## 2018-12-13 NOTE — Progress Notes (Signed)
PROGRESS NOTE    Cameron Boyle  NKN:[id number] DOB: 09-Mar-1936 DOA: 12/13/2018 PCP: [name], MD (Inactive)   Brief Narrative:  Per HPI: 83 y.o.malewith medical history ofdiastolic dysfunction, right MCA stroke, hypertension, hyperlipidemia, CKD stage IV presenting with 1 to 30-month history of dyspnea on exertion.Admitted on 11/28/2018 with tachycardia, volume overload symptoms as well as hemoglobin dropping down to 6.7, improved posttransfusion, developed pneumonia while undergoing bowel prep, query aspiration--- Failed MBS on 12/09/18--please see full report... Pt would be a candidate for UES dilation or Botox and try dysphagia therapy again following. In the meantime would likely need PEG tube to maintain nutritional/hydration needs.  Patient has received a PEG tube on 3/25 and appears to be feeling quite weak, but maintains adequate urine output. Nephrology consulted and following for advanced CKD.  Assessment & Plan:   Active Problems:   Acute blood loss anemia   Acute on chronic diastolic CHF (congestive heart failure) (HCC)   Acute renal failure superimposed on stage 4 chronic kidney disease (HCC)   Transfusion-dependent anemia   Cough   Symptomatic anemia   Aspiration pneumonia due to regurgitated food ([place])   Dysphagia   Protein-calorie malnutrition, severe   Palliative care by specialist   Goals of care, counseling/discussion   DNR (do not resuscitate) discussion   PEG (percutaneous endoscopic gastrostomy) status ([place])  1. Acute on chronic blood loss anemia secondary to acute GI bleed and CKD. Patient continues to maintain stable hemoglobin levels and will recheck CBC in a.m. He has received Feraheme due to iron deficiency and appreciate GI consultation. Will transfuse for any further drops in hemoglobin. Appreciate Nephrology with [facility] and Aranesp on 3/25 . No overt bleeding identified.  Continue to monitor repeat CBC. 2. Bilateral pneumonia likely  secondary to aspiration.  Chest x-ray on 3/26 with stable multifocal pneumonia noted.  Given increasing O2 requirements and phlegm production, will transition back to Unasyn today to complete course of treatment and discontinue Augmentin for now.  Start Mucomyst and flutter valve. 3. Dysphagia. Patient has had chronic issues with this and may be related to prior stroke. He is now status post PEG tube placement with tube feeds well tolerated and minimal residuals.  Keep head of bed elevated. Discussed withDr. [name], ENT at [facility] [phone]) to see if patient is a candidate for UES dilatation, [name] states that due to COVID 19 transfer restrictions they cannot accept routine/elective transfers at this time ... Patient may see ENT as outpatient down the road in the meantime patient will most likely need PEG tube in order to meet his nutritional/hydration needs...... N.p.o. for now... May have medications crushed in applesauce until PEG tube is placed-- Per [name] --possible PEG tube placement on 12/08/2018 4. Acute on chronic diastolic CHF.  Minimal negative net balance noted overnight with IV Lasix.  Appreciate nephrology for volume management especially in the setting of increasing O2 requirements. 5. AKI on CKD stage IV- stabilizing. Patient is otherwise nonoliguric. Patient started on bicarb and renal US without and acute findings. Appreciate nephrology consultation. 6. Essential hypertension-stable. Continue amlodipine and hydralazine. 7. Dyslipidemia. Continue statin. 8. History of right MCA stroke. Continue on aspirin 81 mg for now and plan to restart Plavix in a.m. 3/26. 9. Right rotator cuff injury. No further complaints in this region.  Prednisone discontinued. 10. Acute hypoxemic respiratory failure likely secondary to aspiration pneumonia with underlying diastolic congestive heart failure. Wean oxygen as tolerated.  See above  recommendations.  Not ready for transfer.   DVT prophylaxis:SCDs Code Status:DNR/DNI Family Communication:Discussed with wife on phone Disposition Plan:Nephrology recommendations appreciated.  Transition back to Unasyn today and add Mucomyst and flutter valve.  Will consider steroids as needed.  Wean oxygen as tolerated.  Plan to transfer to SNF once stable with decreased O2 requirements.   Consultants:  General surgery  Nephrology  Procedures:  PEG tube placement 3/25  Antimicrobials:  Unasyn 3/23->3/26  Augmentin 3/26->3/27  Unasyn 3/27->   Subjective: Patient seen and evaluated today with no new acute complaints or concerns. No acute concerns or events noted overnight.  He continues to remain on increasing amounts of nasal cannula up to 8-9 L this morning.  He is having a more productive cough as well.  Less tube feed residuals noted.  Objective: Vitals:   12/12/18 2116 12/13/18 0537 12/13/18 0805 12/13/18 0836  BP: 129/62 134/64    Pulse: 88 84    Resp: 18 20    Temp: 97.7 F (36.5 C) 98.2 F (36.8 C)    TempSrc: Oral Oral    SpO2: 95% 94% (!) 85% 90%  Weight:      Height:        Intake/Output Summary (Last 24 hours) at 12/13/2018 0852 Last data filed at 12/13/2018 1660 Gross per 24 hour  Intake 800 ml  Output 900 ml  Net -100 ml   Filed Weights   12/10/18 0500 11/23/2018 0644 12/12/18 0500  Weight: 62.8 kg 66 kg 67.1 kg    Examination:  General exam: Appears calm and comfortable  Respiratory system: Clear to auscultation. Respiratory effort normal.  Crackles noted bilaterally.  Nasal cannula 8-9 L. Cardiovascular system: S1 & S2 heard, RRR. No JVD, murmurs, rubs, gallops or clicks. No pedal edema. Gastrointestinal system: Abdomen is nondistended, soft and nontender. No organomegaly or masses felt. Normal bowel sounds heard.  PEG tube clean dry and intact. Central nervous system: Alert and oriented. No focal neurological deficits.  Extremities: Symmetric 5 x 5 power. Skin: No rashes, lesions or ulcers Psychiatry: Judgement and insight appear normal. Mood & affect appropriate.     Data Reviewed: I have personally reviewed following labs and imaging studies  CBC: Recent Labs  Lab 12/09/18 0452 12/10/18 0500 11/26/2018 0440 12/12/18 0447 12/13/18 0428  WBC 9.8 9.7 11.1* 12.7* 17.3*  HGB 7.0* 7.2* 7.4* 7.5* 8.3*  HCT 23.4* 23.5* 24.4* 24.8* 27.9*  MCV 94.0 92.5 93.1 93.2 93.3  PLT 167 181 226 229 630   Basic Metabolic Panel: Recent Labs  Lab 12/09/18 0452 12/10/18 0500 11/30/2018 0440 12/12/18 0447 12/13/18 0428  NA 141 141 139 143 144  K 4.5 4.4 4.7 4.6 4.8  CL 114* 115* 115* 113* 113*  CO2 17* 14* 14* 16* 18*  GLUCOSE 104* 169* 166* 142* 90  BUN 83* 85* 108* 120* 134*  CREATININE 5.15* 5.26* 5.34* 5.33* 5.16*  CALCIUM 8.2* 8.4* 8.4* 8.6* 8.6*  PHOS 7.3* 7.7*  --   --   --    GFR: Estimated Creatinine Clearance: 10.8 mL/min (A) (by C-G formula based on SCr of 5.16 mg/dL (H)). Liver Function Tests: Recent Labs  Lab 12/09/18 0452 12/10/18 0500 11/26/2018 0440  AST 47*  --  38  ALT 27  --  34  ALKPHOS 74  --  85  BILITOT 0.5  --  0.4  PROT 6.1*  --  6.2*  ALBUMIN 2.5*  2.5* 2.4* 2.4*   Recent Labs  Lab 12/09/18 0452  LIPASE  32   No results for input(s): AMMONIA in the last 168 hours. Coagulation Profile: No results for input(s): INR, PROTIME in the last 168 hours. Cardiac Enzymes: No results for input(s): CKTOTAL, CKMB, CKMBINDEX, TROPONINI in the last 168 hours. BNP (last 3 results) No results for input(s): PROBNP in the last 8760 hours. HbA1C: No results for input(s): HGBA1C in the last 72 hours. CBG: No results for input(s): GLUCAP in the last 168 hours. Lipid Profile: No results for input(s): CHOL, HDL, LDLCALC, TRIG, CHOLHDL, LDLDIRECT in the last 72 hours. Thyroid Function Tests: No results for input(s): TSH, T4TOTAL, FREET4, T3FREE, THYROIDAB in the last 72 hours. Anemia  Panel: No results for input(s): VITAMINB12, FOLATE, FERRITIN, TIBC, IRON, RETICCTPCT in the last 72 hours. Sepsis Labs: No results for input(s): PROCALCITON, LATICACIDVEN in the last 168 hours.  No results found for this or any previous visit (from the past 240 hour(s)).       Radiology Studies: US Renal  Result Date: 12/06/2018 CLINICAL DATA:  Chronic renal disease. EXAM: RENAL / URINARY TRACT ULTRASOUND COMPLETE COMPARISON:  None. FINDINGS: Right Kidney: Renal measurements: 10.3 x 4.5 x 4.3 cm = volume: 102.0 mL. Cortical echogenicity is increased. No solid mass or hydronephrosis visualized. Cysts off the upper pole measuring 2.3 cm in diameter noted. Right pleural effusion is identified. Left Kidney: Renal measurements: 9.9 x 7.4 x 4.0 cm = volume: 154 mL. Echogenicity within normal limits. Cortical echogenicity is increased. No solid mass or hydronephrosis visualized. 5.8 cm cyst off the mid and upper pole noted. Bladder: Appears normal for degree of bladder distention. IMPRESSION: No acute abnormality.  Negative for hydronephrosis. Increased cortical echogenicity bilaterally consistent with medical renal disease. Single bilateral renal cysts. Right pleural effusion. Electronically Signed   By: Inge Rise M.D.   On: 12/07/2018 14:20   Dg Chest Port 1 View  Result Date: 12/12/2018 CLINICAL DATA:  Hypoxemia, shortness of breath. EXAM: PORTABLE CHEST 1 VIEW COMPARISON:  Radiograph of December 09, 2018. FINDINGS: Stable cardiomediastinal silhouette. Grossly stable bilateral lung opacities are noted most consistent with pneumonia. No pneumothorax or significant pleural effusion is noted. Bony thorax is unremarkable. Nasogastric tube has been removed. IMPRESSION: Grossly stable bilateral lung opacities are noted most consistent with multifocal pneumonia. Electronically Signed   By: Marijo Conception, M.D.   On: 12/12/2018 10:41        Scheduled Meds: . acetylcysteine  3 mL Nebulization BID   . albuterol  2.5 mg Nebulization TID  . amLODipine  10 mg Oral Daily  . aspirin  81 mg Oral Daily  . atorvastatin  20 mg Oral q1800  . feeding supplement (OSMOLITE 1.5 CAL)  300 mL Per Tube QID  . feeding supplement (PRO-STAT SUGAR FREE 64)  30 mL Per Tube Daily  . hydrALAZINE  25 mg Oral Q8H  . pantoprazole  40 mg Oral BID AC  . sodium bicarbonate  1,300 mg Oral BID  . sodium chloride flush  3 mL Intravenous Q12H   Continuous Infusions: . sodium chloride    . sodium chloride 20 mL/hr at 12/09/2018 2038  . ampicillin-sulbactam (UNASYN) IV       LOS: 9 days    Time spent: 30 minutes    Pegah Segel Darleen Crocker, DO [facility] Pager [phone]  If 7PM-7AM, please contact night-coverage [email or web address] Password [facility] 12/13/2018, 8:52 AM

## 2018-12-13 NOTE — Progress Notes (Signed)
[facility] Progress Note    Assessment/ Plan:   83 year old male with progressive CKD 4, likely an acute component related to his anemia and aspiration pneumonia.  No indications immediately for RRT however GFR is very low.  1. CKD4 with progression, likely AKI- likely in setting of aspiration pneumonia and anemia.  Baseline creatinine appears to be around 3, now with AKI peaking at 5.3--> 5.1 today.  Renal US without hydronephrosis, UA bland, and SPEP/serum free light chains unremarkable.  It appears that AKI has plateaued and hopeful for downward trajectory in the future.  He is azotemic but does not report symptoms of uremia- no metallic taste, increased lethargy, n/v, no asterixis.  He is NPO based on aspiration so cannot assess dysgeusia. He is a suboptimal dialysis candidate (short or long term) and I discussed this with him today.  I greatly appreciate palliative care c/s yesterday.  I asked him to continue dialogue with wife regarding limits and goals of care especially in regard to dialysis. 2. Anemia, with IDA TSAT 5% and Ferritin 242, also related to #1; rec 510mg  IV Madilyn Hook 3/18; also got feraheme and Aranesp 3/25 as well with improvement in Hgb 3. NAG metabolic Acidosis- improving with oral bicarb 4. Aspiration PNA switched from Unasyn --> augmentin 3/26 but with increased O2 and WBC ct have switched back to Unasyn 3/27.   5. Progressive debility since stroke 07/18/2018 6. Hypervolemia and LE edema--> small dose of Lasix yesterday 3/26, will order another 20 IV today 3/27. 7. PCM, hypoalbuminemia- on prostat and TF (Osmolite) 8. HTN on CCB and hydralazine 9. R rotator cuff pain, previously on prednisone, now stopped- should help azotemia 10. Dysphagia s/p PEG 12/10/2018--> Botox for UES dilatation being deferred d/t COVID-19 pandemic 11. Dispo: SNF, pending improvement in renal function.  Palliative care is following and we appreciate their care.     Subjective:     WBC ct up, switched back to Unasyn from Augmentin.  O2 req up as well.  CXR with bilateral multifocal opacities, largely unchanged to my eye.     Objective:   BP 134/64 (BP Location: Left Arm)   Pulse 84   Temp 98.2 F (36.8 C) (Oral)   Resp 20   Ht 5\' 10"  (1.778 m)   Wt 67.1 kg   SpO2 90%   BMI 21.23 kg/m   Intake/Output Summary (Last 24 hours) at 12/13/2018 0957 Last data filed at 12/13/2018 7412 Gross per 24 hour  Intake 800 ml  Output 900 ml  Net -100 ml   Weight change:   Physical Exam: Gen: older man, NAD, sitting in chair NECK: + JVD still present CVS: RRR II/VI systolic murmur Resp: bilateral diffuse inspiratory rhonchi and crackles Abd: soft, sl tender, PEG in place without erythema/ drainage Ext: 1+ LE edema, unchanged MSK: sarcopenic NEURO: L-sided deficits, no asterixis  Imaging: US Renal  Result Date: 11/29/2018 CLINICAL DATA:  Chronic renal disease. EXAM: RENAL / URINARY TRACT ULTRASOUND COMPLETE COMPARISON:  None. FINDINGS: Right Kidney: Renal measurements: 10.3 x 4.5 x 4.3 cm = volume: 102.0 mL. Cortical echogenicity is increased. No solid mass or hydronephrosis visualized. Cysts off the upper pole measuring 2.3 cm in diameter noted. Right pleural effusion is identified. Left Kidney: Renal measurements: 9.9 x 7.4 x 4.0 cm = volume: 154 mL. Echogenicity within normal limits. Cortical echogenicity is increased. No solid mass or hydronephrosis visualized. 5.8 cm cyst off the mid and upper pole noted. Bladder: Appears normal for degree of bladder  distention. IMPRESSION: No acute abnormality.  Negative for hydronephrosis. Increased cortical echogenicity bilaterally consistent with medical renal disease. Single bilateral renal cysts. Right pleural effusion. Electronically Signed   By: Inge Rise M.D.   On: 12/17/2018 14:20   Dg Chest Port 1 View  Result Date: 12/12/2018 CLINICAL DATA:  Hypoxemia, shortness of breath. EXAM: PORTABLE CHEST 1 VIEW COMPARISON:   Radiograph of December 09, 2018. FINDINGS: Stable cardiomediastinal silhouette. Grossly stable bilateral lung opacities are noted most consistent with pneumonia. No pneumothorax or significant pleural effusion is noted. Bony thorax is unremarkable. Nasogastric tube has been removed. IMPRESSION: Grossly stable bilateral lung opacities are noted most consistent with multifocal pneumonia. Electronically Signed   By: Marijo Conception, M.D.   On: 12/12/2018 10:41    Labs: BMET Recent Labs  Lab 12/07/18 0510 12/08/18 0641 12/09/18 0452 12/10/18 0500 12/10/2018 0440 12/12/18 0447 12/13/18 0428  NA 138 139 141 141 139 143 144  K 4.0 4.4 4.5 4.4 4.7 4.6 4.8  CL 111 111 114* 115* 115* 113* 113*  CO2 17* 17* 17* 14* 14* 16* 18*  GLUCOSE 138* 111* 104* 169* 166* 142* 90  BUN 87* 85* 83* 85* 108* 120* 134*  CREATININE 4.93* 5.14* 5.15* 5.26* 5.34* 5.33* 5.16*  CALCIUM 7.9* 8.0* 8.2* 8.4* 8.4* 8.6* 8.6*  PHOS  --   --  7.3* 7.7*  --   --   --    CBC Recent Labs  Lab 12/10/18 0500 11/29/2018 0440 12/12/18 0447 12/13/18 0428  WBC 9.7 11.1* 12.7* 17.3*  HGB 7.2* 7.4* 7.5* 8.3*  HCT 23.5* 24.4* 24.8* 27.9*  MCV 92.5 93.1 93.2 93.3  PLT 181 226 229 266    Medications:    . acetylcysteine  3 mL Nebulization BID  . albuterol  2.5 mg Nebulization TID  . amLODipine  10 mg Oral Daily  . aspirin  81 mg Oral Daily  . atorvastatin  20 mg Oral q1800  . feeding supplement (OSMOLITE 1.5 CAL)  300 mL Per Tube QID  . feeding supplement (PRO-STAT SUGAR FREE 64)  30 mL Per Tube Daily  . hydrALAZINE  25 mg Oral Q8H  . pantoprazole sodium  40 mg Per Tube BID  . sodium bicarbonate  1,300 mg Oral BID  . sodium chloride flush  3 mL Intravenous Q12H      Madelon Lips MD [facility] pgr [phone] 12/13/2018, 9:57 AM

## 2018-12-13 NOTE — Progress Notes (Signed)
Physical Therapy Treatment Patient Details Name: Cameron Boyle MRN: [id number] DOB: Jan 30, 1936 Today's Date: 12/13/2018    History of Present Illness Cameron Boyle is a 83 y.o. male with medical history of diastolic dysfunction, right MCA stroke, hypertension, hyperlipidemia, CKD stage IV presenting with 1 to 72-month history of dyspnea on exertion.  Patient had routine blood work obtained by his PCP on 11/28/2018.  His hemoglobin was 6.3.  However further history reveals that the patient has had increasing generalized weakness, fatigue, and dyspnea on exertion for nearly 2 months.  He also complains of increasing lower extremity edema.  He denies any frank PND orthopnea.  He denies any chest pain, fevers, chills, coughing, hemoptysis.  There is been no hematochezia, melena, hematemesis, hematuria.    PT Comments    Pt supine in bed and willing to participate.  Pt has just received breathing treatment and reports increased ease.  Pt with min A for bed mobility and transfer training, required verbal cueing for hand placement to assist with movements and increased time to complete task due to weakness. Upon sitting up pt reports dizziness that lasted ~ 2 minutes.  Seated therex complete on EOB for strengthening.  Transfer training to chair with small labored movements.  Noted decreased in O2 saturation following transfer to chair.  Increased O2 A to 9L, instructed diaphragmatic breathing to increase saturation and RN aware. Pt was slighty confused today with ability to state name and aware he is in [place], stated his DOB year for 1986.  EOS pt left in chair with call bell within reach and chair alarm set.  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    Follow Up Recommendations  SNF     Equipment Recommendations  None recommended by PT    Recommendations for Other Services       Precautions / Restrictions Precautions Precautions: Fall Restrictions Weight Bearing Restrictions: No    Mobility  Bed Mobility Overal  bed mobility: Modified Independent             General bed mobility comments: increased time, cueing for hand placement to assist with supine to sit  Transfers Overall transfer level: Modified independent Equipment used: Rolling walker (2 wheeled) Transfers: Sit to/from [facility] Sit to Stand: Mod assist Stand pivot transfers: Min assist       General transfer comment: Pt c/o dizziness following supine to sit x 2 minutes.  BP WNL.  Mod A with STS and small steps to chair min A.  Noted decreased O2 saturation to 84% and pulse at 103 BPM, increased to 9L O2 saturation and RN aware of status.  Diaphramgatic breathing instructed and able to increase to 94% with 9L O2 A.  Pt required increased time to complete and limited by fatigue  Ambulation/Gait Ambulation/Gait assistance: Min assist Gait Distance (Feet): 5 Feet Assistive device: Rolling walker (2 wheeled) Gait Pattern/deviations: Step-to pattern;Decreased stride length     General Gait Details: slow labored movement wiht RW   Stairs             Wheelchair Mobility    Modified Rankin (Stroke Patients Only)       Balance                                            Cognition Arousal/Alertness: Lethargic Behavior During Therapy: WFL for tasks assessed/performed Overall Cognitive Status:  Impaired/Different from baseline                                 General Comments: Pt confused, able to stated name, stated 1986 for year of DOB, aware he is in [place]       Exercises Total Joint Exercises Ankle Circles/Pumps: Strengthening;Both;10 reps;Seated Long Arc Quad: AROM;Both;10 reps    General Comments        Pertinent Vitals/Pain Faces Pain Scale: Hurts a little bit Pain Location: LBP Pain Descriptors / Indicators: Aching Pain Intervention(s): Limited activity within patient's tolerance;Monitored during session;Repositioned    Home Living                       Prior Function            PT Goals (current goals can now be found in the care plan section)      Frequency    Min 3X/week      PT Plan Current plan remains appropriate    Co-evaluation              AM-PAC PT "6 Clicks" Mobility   Outcome Measure  Help needed turning from your back to your side while in a flat bed without using bedrails?: None Help needed moving from lying on your back to sitting on the side of a flat bed without using bedrails?: A Little Help needed moving to and from a bed to a chair (including a wheelchair)?: A Lot Help needed standing up from a chair using your arms (e.g., wheelchair or bedside chair)?: A Lot Help needed to walk in hospital room?: A Lot Help needed climbing 3-5 steps with a railing? : Total 6 Click Score: 14    End of Session Equipment Utilized During Treatment: Gait belt;Oxygen Activity Tolerance: Patient limited by fatigue Patient left: in chair;with call bell/phone within reach;with nursing/sitter in room;with chair alarm set Nurse Communication: Mobility status(RN aware of status and vital signs, in room at EOS) PT Visit Diagnosis: Unsteadiness on feet (R26.81);Muscle weakness (generalized) (M62.81);Difficulty in walking, not elsewhere classified (R26.2)     Time: 5686-1683 PT Time Calculation (min) (ACUTE ONLY): 30 min  Charges:  $Therapeutic Exercise: 8-22 mins $Therapeutic Activity: 8-22 mins                     [address], LPTA; CBIS [phone]  Aldona Lento 12/13/2018, 10:28 AM

## 2018-12-14 LAB — BASIC METABOLIC PANEL
Anion gap: 13 (ref 5–15)
BUN: 142 mg/dL — ABNORMAL HIGH (ref 8–23)
CHLORIDE: 116 mmol/L — AB (ref 98–111)
CO2: 19 mmol/L — ABNORMAL LOW (ref 22–32)
Calcium: 8.5 mg/dL — ABNORMAL LOW (ref 8.9–10.3)
Creatinine, Ser: 5.44 mg/dL — ABNORMAL HIGH (ref 0.61–1.24)
GFR calc Af Amer: 11 mL/min — ABNORMAL LOW (ref >60)
GFR calc non Af Amer: 9 mL/min — ABNORMAL LOW (ref >60)
Glucose, Bld: 117 mg/dL — ABNORMAL HIGH (ref 70–99)
Potassium: 5 mmol/L (ref 3.5–5.1)
Sodium: 148 mmol/L — ABNORMAL HIGH (ref 135–145)

## 2018-12-14 LAB — CBC
HCT: 24.4 % — ABNORMAL LOW (ref 39.0–52.0)
HEMOGLOBIN: 7.4 g/dL — AB (ref 13.0–17.0)
MCH: 28.5 pg (ref 26.0–34.0)
MCHC: 30.3 g/dL (ref 30.0–36.0)
MCV: 93.8 fL (ref 80.0–100.0)
Platelets: 202 10*3/uL (ref 150–400)
RBC: 2.6 MIL/uL — AB (ref 4.22–5.81)
RDW: 14.7 % (ref 11.5–15.5)
WBC: 17.8 10*3/uL — ABNORMAL HIGH (ref 4.0–10.5)
nRBC: 0.7 % — ABNORMAL HIGH (ref 0.0–0.2)

## 2018-12-14 MED ORDER — MORPHINE SULFATE (PF) 2 MG/ML IV SOLN
2.0000 mg | INTRAVENOUS | Status: DC | PRN
  Administered 2018-12-14 – 2018-12-15 (×4): 2 mg via INTRAVENOUS
  Filled 2018-12-14 (×4): qty 1

## 2018-12-14 MED ORDER — LORAZEPAM 2 MG/ML IJ SOLN
1.0000 mg | INTRAMUSCULAR | Status: DC | PRN
  Administered 2018-12-15 (×2): 1 mg via INTRAVENOUS
  Filled 2018-12-14 (×3): qty 1

## 2018-12-14 MED ORDER — METHYLPREDNISOLONE SODIUM SUCC 40 MG IJ SOLR
40.0000 mg | Freq: Two times a day (BID) | INTRAMUSCULAR | Status: DC
  Administered 2018-12-14 – 2018-12-15 (×3): 40 mg via INTRAVENOUS
  Filled 2018-12-14 (×3): qty 1

## 2018-12-14 MED ORDER — FREE WATER
200.0000 mL | Freq: Three times a day (TID) | Status: DC
  Administered 2018-12-14 – 2018-12-15 (×4): 200 mL

## 2018-12-14 MED ORDER — FUROSEMIDE 10 MG/ML IJ SOLN
80.0000 mg | Freq: Once | INTRAMUSCULAR | Status: AC
  Administered 2018-12-14: 80 mg via INTRAVENOUS
  Filled 2018-12-14: qty 8

## 2018-12-14 MED ORDER — BUDESONIDE 0.25 MG/2ML IN SUSP
0.2500 mg | Freq: Two times a day (BID) | RESPIRATORY_TRACT | Status: DC
  Administered 2018-12-14 – 2018-12-15 (×3): 0.25 mg via RESPIRATORY_TRACT
  Filled 2018-12-14 (×3): qty 2

## 2018-12-14 NOTE — Progress Notes (Signed)
Patient was sedate enough to be NTSX with assistance. Saturation 97 on 11 liter high flow. A large amount of clear lt yellow thick secretions obtained about 10cc . Extra albuterol give after mouth care and cleaning of teeth. Patient sounds decreased with no upper airway noise. Saturation 97 upon completion. Hopefully he will rest well. No CPT performed.

## 2018-12-14 NOTE — Progress Notes (Signed)
PROGRESS NOTE    Cameron Boyle  UEA:[id number] DOB: June 11, 1936 DOA: 12/07/2018 PCP: [name], MD (Inactive)   Brief Narrative:  Per HPI: 83 y.o.malewith medical history ofdiastolic dysfunction, right MCA stroke, hypertension, hyperlipidemia, CKD stage IV presenting with 1 to 37-month history of dyspnea on exertion.Admitted on 11/28/2018 with tachycardia, volume overload symptoms as well as hemoglobin dropping down to 6.7, improved posttransfusion, developed pneumonia while undergoing bowel prep, query aspiration--- Failed MBS on 12/09/18--please see full report... Pt would be a candidate for UES dilation or Botox and try dysphagia therapy again following. In the meantime would likely need PEG tube to maintain nutritional/hydration needs.  Patient has received a PEG tube on 3/25 and appears to be feeling quite weak, but maintains adequate urine output. Nephrology consulted and following for advanced CKD.  Assessment & Plan:   Active Problems:   Acute blood loss anemia   Acute on chronic diastolic CHF (congestive heart failure) (HCC)   Acute renal failure superimposed on stage 4 chronic kidney disease (HCC)   Transfusion-dependent anemia   Cough   Symptomatic anemia   Aspiration pneumonia due to regurgitated food ([place])   Dysphagia   Protein-calorie malnutrition, severe   Palliative care by specialist   Goals of care, counseling/discussion   DNR (do not resuscitate) discussion   PEG (percutaneous endoscopic gastrostomy) status ([place])   1. Acute on chronic blood loss anemia secondary to acute GI bleedand CKD. Patient continues to maintain fluctuating hemoglobin levels and will recheck CBC in a.m. He has received Feraheme due to iron deficiency and appreciate GI consultation. Will transfuse for any further drops in hemoglobin.Appreciate Nephrology with [facility] and Aranesp on 3/25. No overt bleeding identified.  Continue to monitor repeat CBC. 2. Bilateral pneumonia  likely secondary to aspiration. Chest x-ray on 3/26 with stable multifocal pneumonia noted.    Continue on IV Unasyn for now as well as Mucomyst and flutter valve.  Pulmicort and IV Solu-Medrol added today due to increasing oxygen requirements. 3. Dysphagia. Patient has had chronic issues with this and may be related to prior stroke. He is now status post PEG tube placement with tube feedswell tolerated and minimal residuals.  Keep head of bed elevated. Discussed withDr. [name], ENT at [facility] [phone] and [phone]) to see if patient is a candidate for UES dilatation, [name] states that due to COVID 19 transfer restrictions they cannot accept routine/elective transfers at this time ... Patient may see ENT as outpatient down the road in the meantime patient will most likely need PEG tube in order to meet his nutritional/hydration needs...... N.p.o. for now... May have medications crushed in applesauce until PEG tube is placed-- Per [name] --possible PEG tube placement on 11/29/2018 4. Acute on chronic diastolic CHF. Minimal negative net balance noted overnight with IV Lasix.  Appreciate nephrology for volume management especially in the setting of increasing O2 requirements.  Will add back some free water flushes today and monitor BMP. 5. AKI on CKD stage IV- stabilizing. Patient is otherwise nonoliguric, but with decreasing urine output overnight at 500 cc.Patient started on bicarb and renal US without and acute findings. Appreciate nephrology consultation. 6. Essential hypertension-stable. Continue amlodipine and hydralazine. 7. Dyslipidemia. Continue statin. 8. History of right MCA stroke. Continue on aspirin 81 mg for now and plan to restart Plavix in a.m. 3/26. 9. Right rotator cuff injury. No further complaints in this region.  Prednisone discontinued. 10. Acute hypoxemic respiratory failure likely secondary to aspiration pneumonia with  underlying diastolic congestive heart failure. Wean oxygen as tolerated.  See above recommendations.  Not ready for transfer.   DVT prophylaxis:SCDs Code Status:DNR/DNI Family Communication:Discussed with wife on phone Disposition Plan:Nephrology recommendations appreciated.    Continue on Unasyn for now and add IV steroids and Pulmicort due to increasing oxygen requirements. Plan to transfer to SNF once stable with decreased O2 requirements.   Consultants:  General surgery  Nephrology  Procedures:  PEG tube placement 3/25  Antimicrobials:  Unasyn 3/23->3/26  Augmentin 3/26->3/27  Unasyn 3/27->(anticipate 2 more days)  Subjective: Patient seen and evaluated today with no new acute complaints or concerns.  He continues to have what appears to be productive cough and remains on high O2 requirements at approximately 10 L this morning.  He was noted to have some mild anxiety and agitation this morning.  Objective: Vitals:   12/13/18 1942 12/13/18 2140 12/14/18 0538 12/14/18 0727  BP:  (!) 154/63 (!) 152/68   Pulse:  (!) 102 98   Resp:  20 20   Temp:  98.4 F (36.9 C) 98.5 F (36.9 C)   TempSrc:  Oral Oral   SpO2: 91% 94% 96% 96%  Weight:   65.7 kg   Height:        Intake/Output Summary (Last 24 hours) at 12/14/2018 0928 Last data filed at 12/14/2018 0600 Gross per 24 hour  Intake 264.56 ml  Output 500 ml  Net -235.44 ml   Filed Weights   11/30/2018 0644 12/12/18 0500 12/14/18 0538  Weight: 66 kg 67.1 kg 65.7 kg    Examination:  General exam: Appears calm and comfortable  Respiratory system: Clear to auscultation. Respiratory effort normal.  Currently on 10 L nasal cannula. Cardiovascular system: S1 & S2 heard, RRR. No JVD, murmurs, rubs, gallops or clicks. No pedal edema. Gastrointestinal system: Abdomen is nondistended, soft and nontender. No organomegaly or masses felt. Normal bowel sounds heard.  PEG tube is clean dry and intact. Central  nervous system: Alert and oriented. No focal neurological deficits. Extremities: Symmetric 5 x 5 power. Skin: No rashes, lesions or ulcers Psychiatry: Judgement and insight appear normal. Mood & affect appropriate.     Data Reviewed: I have personally reviewed following labs and imaging studies  CBC: Recent Labs  Lab 12/10/18 0500 11/29/2018 0440 12/12/18 0447 12/13/18 0428 12/14/18 0623  WBC 9.7 11.1* 12.7* 17.3* 17.8*  HGB 7.2* 7.4* 7.5* 8.3* 7.4*  HCT 23.5* 24.4* 24.8* 27.9* 24.4*  MCV 92.5 93.1 93.2 93.3 93.8  PLT 181 226 229 266 161   Basic Metabolic Panel: Recent Labs  Lab 12/09/18 0452 12/10/18 0500 12/02/2018 0440 12/12/18 0447 12/13/18 0428 12/14/18 0623  NA 141 141 139 143 144 148*  K 4.5 4.4 4.7 4.6 4.8 5.0  CL 114* 115* 115* 113* 113* 116*  CO2 17* 14* 14* 16* 18* 19*  GLUCOSE 104* 169* 166* 142* 90 117*  BUN 83* 85* 108* 120* 134* 142*  CREATININE 5.15* 5.26* 5.34* 5.33* 5.16* 5.44*  CALCIUM 8.2* 8.4* 8.4* 8.6* 8.6* 8.5*  PHOS 7.3* 7.7*  --   --   --   --    GFR: Estimated Creatinine Clearance: 10.1 mL/min (A) (by C-G formula based on SCr of 5.44 mg/dL (H)). Liver Function Tests: Recent Labs  Lab 12/09/18 0452 12/10/18 0500 11/29/2018 0440  AST 47*  --  38  ALT 27  --  34  ALKPHOS 74  --  85  BILITOT 0.5  --  0.4  PROT 6.1*  --  6.2*  ALBUMIN 2.5*  2.5* 2.4* 2.4*   Recent Labs  Lab 12/09/18 0452  LIPASE 32   No results for input(s): AMMONIA in the last 168 hours. Coagulation Profile: No results for input(s): INR, PROTIME in the last 168 hours. Cardiac Enzymes: No results for input(s): CKTOTAL, CKMB, CKMBINDEX, TROPONINI in the last 168 hours. BNP (last 3 results) No results for input(s): PROBNP in the last 8760 hours. HbA1C: No results for input(s): HGBA1C in the last 72 hours. CBG: No results for input(s): GLUCAP in the last 168 hours. Lipid Profile: No results for input(s): CHOL, HDL, LDLCALC, TRIG, CHOLHDL, LDLDIRECT in the last 72  hours. Thyroid Function Tests: No results for input(s): TSH, T4TOTAL, FREET4, T3FREE, THYROIDAB in the last 72 hours. Anemia Panel: No results for input(s): VITAMINB12, FOLATE, FERRITIN, TIBC, IRON, RETICCTPCT in the last 72 hours. Sepsis Labs: No results for input(s): PROCALCITON, LATICACIDVEN in the last 168 hours.  No results found for this or any previous visit (from the past 240 hour(s)).       Radiology Studies: Dg Chest Port 1 View  Result Date: 12/12/2018 CLINICAL DATA:  Hypoxemia, shortness of breath. EXAM: PORTABLE CHEST 1 VIEW COMPARISON:  Radiograph of December 09, 2018. FINDINGS: Stable cardiomediastinal silhouette. Grossly stable bilateral lung opacities are noted most consistent with pneumonia. No pneumothorax or significant pleural effusion is noted. Bony thorax is unremarkable. Nasogastric tube has been removed. IMPRESSION: Grossly stable bilateral lung opacities are noted most consistent with multifocal pneumonia. Electronically Signed   By: Marijo Conception, M.D.   On: 12/12/2018 10:41        Scheduled Meds: . acetylcysteine  3 mL Nebulization BID  . albuterol  2.5 mg Nebulization TID  . amLODipine  10 mg Oral Daily  . aspirin  81 mg Oral Daily  . atorvastatin  20 mg Oral q1800  . budesonide (PULMICORT) nebulizer solution  0.25 mg Nebulization BID  . feeding supplement (OSMOLITE 1.5 CAL)  300 mL Per Tube QID  . feeding supplement (PRO-STAT SUGAR FREE 64)  30 mL Per Tube Daily  . free water  200 mL Per Tube Q8H  . hydrALAZINE  25 mg Oral Q8H  . methylPREDNISolone (SOLU-MEDROL) injection  40 mg Intravenous Q12H  . pantoprazole sodium  40 mg Per Tube BID  . sodium bicarbonate  1,300 mg Oral BID  . sodium chloride flush  3 mL Intravenous Q12H   Continuous Infusions: . sodium chloride    . sodium chloride 20 mL/hr at 11/22/2018 2038  . ampicillin-sulbactam (UNASYN) IV 3 g (12/13/18 1033)     LOS: 10 days    Time spent: 30 minutes     Darleen Crocker, DO  [facility] Pager [phone]  If 7PM-7AM, please contact night-coverage [email or web address] Password [facility] 12/14/2018, 9:28 AM

## 2018-12-14 NOTE — Progress Notes (Signed)
Pt restless, agitated, yelling out, "I'm unable to breath, O2 @ 10L,SpO2 96%, Resp 18, Morphine 2mg  IV given. Will continue to monitor.

## 2018-12-14 NOTE — Progress Notes (Signed)
Patient ID: Cameron Boyle, male   DOB: 12/14/1935, 83 y.o.   MRN: [id number] S:Mr. Bova is very lethargic this am and having some audible course breath sounds.  His wife is at the bedside. O:BP (!) 152/68 (BP Location: Right Arm)   Pulse 98   Temp 98.5 F (36.9 C) (Oral)   Resp 20   Ht '5\' 10"'  (1.778 m)   Wt 65.7 kg   SpO2 96%   BMI 20.78 kg/m   Intake/Output Summary (Last 24 hours) at 12/14/2018 1111 Last data filed at 12/14/2018 0600 Gross per 24 hour  Intake 264.56 ml  Output 500 ml  Net -235.44 ml   Intake/Output: I/O last 3 completed shifts: In: 764.6 [I.V.:16; NG/GT:660; IV Piggyback:88.6] Out: 600 [Urine:600]  Intake/Output this shift:  No intake/output data recorded. Weight change:  Gen: frail, cachectic, WM who is lethargic but arousable CVS:RRR, heart sounds difficult to auscultate due to pulmonary friction rub Resp: pulmonary friction rub, bilateral rhonchi and occ crackles Abd: +BS, PEG in place, mildly tender Ext:1+ edema  Recent Labs  Lab 12/08/18 0641 12/09/18 0452 12/10/18 0500 11/23/2018 0440 12/12/18 0447 12/13/18 0428 12/14/18 0623  NA 139 141 141 139 143 144 148*  K 4.4 4.5 4.4 4.7 4.6 4.8 5.0  CL 111 114* 115* 115* 113* 113* 116*  CO2 17* 17* 14* 14* 16* 18* 19*  GLUCOSE 111* 104* 169* 166* 142* 90 117*  BUN 85* 83* 85* 108* 120* 134* 142*  CREATININE 5.14* 5.15* 5.26* 5.34* 5.33* 5.16* 5.44*  ALBUMIN  --  2.5*  2.5* 2.4* 2.4*  --   --   --   CALCIUM 8.0* 8.2* 8.4* 8.4* 8.6* 8.6* 8.5*  PHOS  --  7.3* 7.7*  --   --   --   --   AST  --  47*  --  38  --   --   --   ALT  --  27  --  34  --   --   --    Liver Function Tests: Recent Labs  Lab 12/09/18 0452 12/10/18 0500 12/03/2018 0440  AST 47*  --  38  ALT 27  --  34  ALKPHOS 74  --  85  BILITOT 0.5  --  0.4  PROT 6.1*  --  6.2*  ALBUMIN 2.5*  2.5* 2.4* 2.4*   Recent Labs  Lab 12/09/18 0452  LIPASE 32   No results for input(s): AMMONIA in the last 168 hours. CBC: Recent Labs  Lab  12/10/18 0500 11/29/2018 0440 12/12/18 0447 12/13/18 0428 12/14/18 0623  WBC 9.7 11.1* 12.7* 17.3* 17.8*  HGB 7.2* 7.4* 7.5* 8.3* 7.4*  HCT 23.5* 24.4* 24.8* 27.9* 24.4*  MCV 92.5 93.1 93.2 93.3 93.8  PLT 181 226 229 266 202   Cardiac Enzymes: No results for input(s): CKTOTAL, CKMB, CKMBINDEX, TROPONINI in the last 168 hours. CBG: No results for input(s): GLUCAP in the last 168 hours.  Iron Studies: No results for input(s): IRON, TIBC, TRANSFERRIN, FERRITIN in the last 72 hours. Studies/Results: No results found. [name] acetylcysteine  3 mL Nebulization BID  . albuterol  2.5 mg Nebulization TID  . amLODipine  10 mg Oral Daily  . aspirin  81 mg Oral Daily  . atorvastatin  20 mg Oral q1800  . budesonide (PULMICORT) nebulizer solution  0.25 mg Nebulization BID  . feeding supplement (OSMOLITE 1.5 CAL)  300 mL Per Tube QID  . feeding supplement (PRO-STAT SUGAR FREE 64)  30 mL  Per Tube Daily  . free water  200 mL Per Tube Q8H  . hydrALAZINE  25 mg Oral Q8H  . methylPREDNISolone (SOLU-MEDROL) injection  40 mg Intravenous Q12H  . pantoprazole sodium  40 mg Per Tube BID  . sodium bicarbonate  1,300 mg Oral BID  . sodium chloride flush  3 mL Intravenous Q12H    BMET    Component Value Date/Time   NA 148 (H) 12/14/2018 0623   K 5.0 12/14/2018 0623   CL 116 (H) 12/14/2018 0623   CO2 19 (L) 12/14/2018 0623   GLUCOSE 117 (H) 12/14/2018 0623   BUN 142 (H) 12/14/2018 0623   CREATININE 5.44 (H) 12/14/2018 0623   CALCIUM 8.5 (L) 12/14/2018 0623   GFRNONAA 9 (L) 12/14/2018 0623   GFRAA 11 (L) 12/14/2018 0623   CBC    Component Value Date/Time   WBC 17.8 (H) 12/14/2018 0623   RBC 2.60 (L) 12/14/2018 0623   HGB 7.4 (L) 12/14/2018 0623   HCT 24.4 (L) 12/14/2018 0623   PLT 202 12/14/2018 0623   MCV 93.8 12/14/2018 0623   MCH 28.5 12/14/2018 0623   MCHC 30.3 12/14/2018 0623   RDW 14.7 12/14/2018 0623   LYMPHSABS 0.9 12/03/2018 1110   MONOABS 0.7 11/18/2018 1110   EOSABS 0.5  12/08/2018 1110   BASOSABS 0.1 11/17/2018 1110     Assessment/Plan:  1. AKI/CKD stage 4 vs progression- in setting of aspiration pneumonia and ABLA.  He is a poor candidate for longterm dialysis.  Unfortunately his renal function has continued to decline as his clinical condition as well.  I discussed HD with his wife as Mr. Mossa is not awake enough to participate in the conversation.  I was honest that dialysis would not reverse his underlying problems; asp pneumonia, or his inability to eat, nor improve his strength/functional status.  Palliative care has already met with Palliative care team on 12/12/18 and he is now DNR/DNI.  We discussed the decreased quality of life he is currently experiencing and that dialysis would only lessen it further.  Mrs. Wendorff understands and agrees that he would not benefit from dialysis and would like to transition to comfort care at this time.  I will contact Dr. Manuella Ghazi and relay this information.  Will sign off.  Please call with any questions or concerns.  Will give dose of lasix to see if that can relieve some his respiratory symptoms.  Donetta Potts, MD [employer] [phone]

## 2018-12-14 NOTE — Progress Notes (Signed)
Resting quietly, respir 16, eyes closed. HOB elevated for comfort. No distress noted.

## 2018-12-14 NOTE — Progress Notes (Signed)
Pt continues w/ restlessness, pulled off all clothing, O2, sitting on side of bed, yelling. Ativan 1 mg given as ordered.

## 2018-12-15 MED ORDER — GLYCOPYRROLATE 0.2 MG/ML IJ SOLN
0.1000 mg | Freq: Three times a day (TID) | INTRAMUSCULAR | Status: DC
  Administered 2018-12-15: 0.1 mg via INTRAVENOUS
  Filled 2018-12-15: qty 1

## 2018-12-18 NOTE — Progress Notes (Signed)
Notified Dr. Manuella Ghazi that 275 ml residual was noted from PEG tube prior to 2 pm tube feeding, 2 pm tube feeding and free water was held and discontinued by Dr. Manuella Ghazi.  Patient also noted to be coughing up blood tinged sputum which was also reported to Dr. Manuella Ghazi. No further orders received, will continue to monitor.

## 2018-12-18 NOTE — Discharge Summary (Signed)
Physician Discharge Summary  Cameron Boyle GHW:[id number] DOB: 1936/09/03 DOA: December 25, 2018  PCP: [name], MD (Inactive)  Admit date: 12-25-18  Death date: Jan 05, 2019 12-29-2014  Admitted From:Home  Disposition:  Expired  Brief/Interim Summary: Per HPI: 83 y.o.malewith medical history ofdiastolic dysfunction, right MCA stroke, hypertension, hyperlipidemia, CKD stage IV presenting with 1 to 31-month history of dyspnea on exertion.Admitted on 11/28/2018 with tachycardia, volume overload symptoms as well as hemoglobin dropping down to 6.7, improved posttransfusion, developed pneumonia while undergoing bowel prep, query aspiration--- Failed MBS on 12/09/18--please see full report... Pt would be a candidate for UES dilation or Botox and try dysphagia therapy again following. In the meantime would likely need PEG tube to maintain nutritional/hydration needs.  Patient had received a PEG tube on 3/25 and appears to be feeling quite weak, but maintains adequate urine output. Nephrology was consulted and following for advanced CKD with no plans for hemodialysis given patient's condition.  The specialty ultimately signed off as there was nothing further that they could offer given patient's worsening condition.  He ultimately finished a full 7-day course of IV Unasyn, but continued to have signs of recurrent aspiration and significant congestion and overall clinical deterioration.  It was determined that patient would benefit from comfort measures only to prevent any further suffering, and therefore patient was started on aggressive morphine and Ativan supplementation for any anxiety, air hunger, or pain.  He continued to have worsening lethargy and periods of agitation with high amounts of secretion.  Tube feeds were discontinued on 05-Jan-2023 and patient was given Robinul for secretions and was placed on deep suctioning by RT.    Wife was notified about the clinical deterioration and overall grave prognosis and  was allowed to be at bedside over the last several days prior to his death on Jan 05, 2023 at 2014/12/29.  Discharge Diagnoses:  Active Problems:   Acute blood loss anemia   Acute on chronic diastolic CHF (congestive heart failure) (HCC)   Acute renal failure superimposed on stage 4 chronic kidney disease (HCC)   Transfusion-dependent anemia   Cough   Symptomatic anemia   Aspiration pneumonia due to regurgitated food ([place])   Dysphagia   Protein-calorie malnutrition, severe   Palliative care by specialist   Goals of care, counseling/discussion   DNR (do not resuscitate) discussion   PEG (percutaneous endoscopic gastrostomy) status [facility])   Consultations:  Nephrology  General Surgery   Procedures/Studies: Dg Chest 2 View  Result Date: 12-25-2018 CLINICAL DATA:  Cough EXAM: CHEST - 2 VIEW COMPARISON:  09/02/2013 FINDINGS: There are patchy areas of interstitial and alveolar airspace disease in the upper and lower lobes bilaterally concerning for interstitial pneumonitis secondary to an infectious or inflammatory etiology versus mild pulmonary edema. There is no pleural effusion or pneumothorax. The heart and mediastinal contours are unremarkable. The osseous structures are unremarkable. IMPRESSION: Patchy areas of interstitial and alveolar airspace disease in the upper and lower lobes bilaterally concerning for interstitial pneumonitis secondary to an infectious or inflammatory etiology versus mild pulmonary edema. Electronically Signed   By: Kathreen Devoid   On: 12-25-2018 11:46   Dg Shoulder Right  Result Date: 12/09/2018 CLINICAL DATA:  Right shoulder pain. No known injury. EXAM: RIGHT SHOULDER - 2+ VIEW COMPARISON:  None. FINDINGS: No fracture or dislocation. Mild acromioclavicular degenerative change. No bony destruction or suspicious bone lesion. No periarticular soft tissue calcifications. IMPRESSION: Mild acromioclavicular degenerative change. No acute bony abnormality. Electronically Signed    By: Aurther Loft.D.  On: 12/09/2018 19:17   US Renal  Result Date: 11/20/2018 CLINICAL DATA:  Chronic renal disease. EXAM: RENAL / URINARY TRACT ULTRASOUND COMPLETE COMPARISON:  None. FINDINGS: Right Kidney: Renal measurements: 10.3 x 4.5 x 4.3 cm = volume: 102.0 mL. Cortical echogenicity is increased. No solid mass or hydronephrosis visualized. Cysts off the upper pole measuring 2.3 cm in diameter noted. Right pleural effusion is identified. Left Kidney: Renal measurements: 9.9 x 7.4 x 4.0 cm = volume: 154 mL. Echogenicity within normal limits. Cortical echogenicity is increased. No solid mass or hydronephrosis visualized. 5.8 cm cyst off the mid and upper pole noted. Bladder: Appears normal for degree of bladder distention. IMPRESSION: No acute abnormality.  Negative for hydronephrosis. Increased cortical echogenicity bilaterally consistent with medical renal disease. Single bilateral renal cysts. Right pleural effusion. Electronically Signed   By: Inge Rise M.D.   On: 12/13/2018 14:20   Dg Chest Port 1 View  Result Date: 12/12/2018 CLINICAL DATA:  Hypoxemia, shortness of breath. EXAM: PORTABLE CHEST 1 VIEW COMPARISON:  Radiograph of December 09, 2018. FINDINGS: Stable cardiomediastinal silhouette. Grossly stable bilateral lung opacities are noted most consistent with pneumonia. No pneumothorax or significant pleural effusion is noted. Bony thorax is unremarkable. Nasogastric tube has been removed. IMPRESSION: Grossly stable bilateral lung opacities are noted most consistent with multifocal pneumonia. Electronically Signed   By: Marijo Conception, M.D.   On: 12/12/2018 10:41   Dg Chest Port 1 View  Result Date: 12/09/2018 CLINICAL DATA:  Nasogastric tube placement. EXAM: PORTABLE CHEST 1 VIEW COMPARISON:  Chest radiograph 12/07/2018 FINDINGS: Enteric tube in place with tip below the diaphragm not included in the field of view, the side port is just beyond the gastroesophageal junction.  Implanted loop recorder again projects over the left chest wall. Multifocal bilateral airspace opacities, slight worsening over the last 2 days. Unchanged heart size and mediastinal contours. Possible small right pleural effusion. No pneumothorax. IMPRESSION: 1. Enteric tube in place with tip below the diaphragm not included in the field of view, the side port is just beyond the gastroesophageal junction in the stomach. 2. Multifocal bilateral airspace opacities suspicious for pneumonia, slight worsening over the last 2 days. Electronically Signed   By: Keith Rake M.D.   On: 12/09/2018 19:16   Dg Chest Port 1 View  Result Date: 12/07/2018 CLINICAL DATA:  Dyspnea. EXAM: PORTABLE CHEST 1 VIEW COMPARISON:  12/05/2018 FINDINGS: Exam demonstrates persistent bilateral multifocal airspace opacification with possible slight worsening over the right mid to lower lung. No effusion. Cardiomediastinal silhouette and remainder of the exam is unchanged. IMPRESSION: Bilateral multifocal airspace process with slight worsening over the right mid to lower lung likely multifocal pneumonia. Electronically Signed   By: Marin Olp M.D.   On: 12/07/2018 09:30   Dg Chest Port 1 View  Result Date: 12/05/2018 CLINICAL DATA:  Shortness of breath, cough EXAM: PORTABLE CHEST 1 VIEW COMPARISON:  11/23/2018 FINDINGS: Patchy airspace disease in both lungs have worsened since prior study concerning for multifocal pneumonia. Heart is borderline in size. No visible significant effusions or acute bony abnormality. Loop recorder device again noted, unchanged. IMPRESSION: Patchy bilateral airspace opacities worsening since prior study concerning for pneumonia. Electronically Signed   By: Rolm Baptise M.D.   On: 12/05/2018 20:29   Dg Swallowing Func-speech Pathology  Result Date: 12/09/2018 Objective Swallowing Evaluation: Type of Study: MBS-Modified Barium Swallow Study  Patient Details Name: PRAYAN ULIN MRN: [id number] Date of  Birth: September 10, 1936 Today's Date: 12/09/2018 Time: SLP  Start Time (ACUTE ONLY): 1435 -SLP Stop Time (ACUTE ONLY): 1512 SLP Time Calculation (min) (ACUTE ONLY): 37 min Past Medical History: Past Medical History: Diagnosis Date . Arthritis   left knee . Stroke [facility])   07/18/18 Past Surgical History: Past Surgical History: Procedure Laterality Date . APPENDECTOMY   . left knee surgery   . LOOP RECORDER INSERTION N/A 07/22/2018  Procedure: LOOP RECORDER INSERTION;  Surgeon: Evans Lance, MD;  Location: [facility];  Service: Cardiovascular;  Laterality: N/A; . TEE WITHOUT CARDIOVERSION N/A 07/22/2018  Procedure: TRANSESOPHAGEAL ECHOCARDIOGRAM (TEE);  Surgeon: Dorothy Spark, MD;  Location[facility]OS[facility];  Service: Cardiovascular;  Laterality: N/A; . TONSILLECTOMY   HPI: 83 year old male presenting for symptomaticnormocyticanemia. Hospitalized back in November with cryptogenic stroke, initially on dual therapy with Plavix and aspirin and subsequently on Plavix only. At time of discharge on November 4 thhis hemoglobin was 10.4. Presented yesterday with a hemoglobin of 6.7. Had noted progressive weakness, lower extremity edema but no overt melena, rectal bleeding. Stool heme-negative here. Received 1 unit of packed red blood cells, 1 dose of Feraheme. Remains on Plavix. Hemoglobin up to 7.9 yesterday and drift to 7.5 this morning.Suspected occult GI bleeding in the setting of Plavix. Planned colonoscopy/EGD today, however overnight the patient began having significant coughing with GoLytely prep. Chest XRay showed patchy bilateral airspace worsening since prior study concerning for pneumonia. Concerning for aspiration pna and patient unable to complete bowel prep so TCS/EGD was cancelled. BSE requested. Pt is known to this SLP from [place] (09/26/18) and outpatient dysphagia therapy for mod/severe sensorimotor based pharyngeal dysphagia. Pt was discharged from outpatient therapy in February with  recommendation to continue HEP indefinitely (Pt with reduced awareness of severity of dysphagia).  Subjective: "Soda pop goes down ok." Special Tests: MBSS Assessment / Plan / Recommendation CHL IP CLINICAL IMPRESSIONS 12/09/2018 Clinical Impression Pt presents with worsening pharyngoesophageal phase dysphagia from 09/26/2018 characterized by reduced tongue base retraction, reduced epiglottic deflection, reduced pharyngeal pressure, and reduced UES relaxation resulting in moderate vallecular residue and mod/severe pharyngeal residue across consistencies presented, however worse with thicker textures/consistencies. Although Pt only aspirated trace amounts (during and post swallow from residuals), he is unlikely to meet nutritional needs given severity of and inefficiency of pharyngoesophageal swallow. Several postures were trialed and head turn to the Left found to be inconsistently effective (also to the RIGHT) resulting in occasional relaxation of UES and passage of bolus. This was similar to his MBSS in January 2020, however with increased severity at this time. Pt is also deconditioned, which further negatively impacts his swallow efficiency and safety. Pt was seen for dysphagia therapy in February to address dysphagia and reported tolerating some regular textures and thin liquids at home, however continued to present with wet vocal quality. Recommend alternative means of nutrition until he can be seen by ENT who could determine if Pt would be a candidate for UES dilation or Botox and try dysphagia therapy again following. SLP discussed with Dr. Roxan Hockey who will discuss with Pt, surgery, and ENT. Consider NPO with alternative means of nutrition vs NTL only now; ice chips PRN after oral care, and continue ORAL care. SLP will follow per goals of care.  SLP Visit Diagnosis Dysphagia, pharyngoesophageal phase (R13.14) Attention and concentration deficit following -- Frontal lobe and executive function deficit  following -- Impact on safety and function Moderate aspiration risk;Risk for inadequate nutrition/hydration   CHL IP TREATMENT RECOMMENDATION 12/09/2018 Treatment Recommendations Therapy as outlined in treatment plan  below   Prognosis 12/09/2018 Prognosis for Safe Diet Advancement Guarded Barriers to Reach Goals Severity of deficits Barriers/Prognosis Comment -- CHL IP DIET RECOMMENDATION 12/09/2018 SLP Diet Recommendations Alternative means - long-term;Ice chips PRN after oral care Liquid Administration via -- Medication Administration Via alternative means Compensations -- Postural Changes --   CHL IP OTHER RECOMMENDATIONS 12/09/2018 Recommended Consults Consider ENT evaluation Oral Care Recommendations Staff/trained caregiver to provide oral care;Oral care QID Other Recommendations --   CHL IP FOLLOW UP RECOMMENDATIONS 12/09/2018 Follow up Recommendations Skilled Nursing facility   [facility] IP FREQUENCY AND DURATION 12/09/2018 Speech Therapy Frequency (ACUTE ONLY) min 2x/week Treatment Duration 1 week      CHL IP ORAL PHASE 12/09/2018 Oral Phase WFL Oral - Pudding Teaspoon -- Oral - Pudding Cup -- Oral - Honey Teaspoon -- Oral - Honey Cup -- Oral - Nectar Teaspoon -- Oral - Nectar Cup -- Oral - Nectar Straw -- Oral - Thin Teaspoon -- Oral - Thin Cup -- Oral - Thin Straw -- Oral - Puree -- Oral - Mech Soft -- Oral - Regular -- Oral - Multi-Consistency -- Oral - Pill -- Oral Phase - Comment --  CHL IP PHARYNGEAL PHASE 12/09/2018 Pharyngeal Phase Impaired Pharyngeal- Pudding Teaspoon -- Pharyngeal -- Pharyngeal- Pudding Cup -- Pharyngeal -- Pharyngeal- Honey Teaspoon Delayed swallow initiation-vallecula;Delayed swallow initiation-pyriform sinuses;Reduced pharyngeal peristalsis;Reduced epiglottic inversion;Reduced tongue base retraction;Penetration/Aspiration during swallow;Penetration/Apiration after swallow;Pharyngeal residue - valleculae;Pharyngeal residue - pyriform;Pharyngeal residue - posterior pharnyx Pharyngeal Material  enters airway, CONTACTS cords and not ejected out Pharyngeal- Honey Cup -- Pharyngeal -- Pharyngeal- Nectar Teaspoon Delayed swallow initiation-vallecula;Delayed swallow initiation-pyriform sinuses;Reduced pharyngeal peristalsis;Reduced epiglottic inversion;Reduced tongue base retraction;Penetration/Aspiration during swallow;Penetration/Apiration after swallow;Pharyngeal residue - valleculae;Pharyngeal residue - pyriform;Pharyngeal residue - posterior pharnyx Pharyngeal Material enters airway, CONTACTS cords and not ejected out Pharyngeal- Nectar Cup -- Pharyngeal -- Pharyngeal- Nectar Straw -- Pharyngeal -- Pharyngeal- Thin Teaspoon Delayed swallow initiation-vallecula;Delayed swallow initiation-pyriform sinuses;Reduced pharyngeal peristalsis;Reduced epiglottic inversion;Reduced tongue base retraction;Penetration/Aspiration during swallow;Penetration/Apiration after swallow;Pharyngeal residue - valleculae;Pharyngeal residue - pyriform;Pharyngeal residue - posterior pharnyx Pharyngeal Material enters airway, passes BELOW cords without attempt by patient to eject out (silent aspiration);Material enters airway, passes BELOW cords and not ejected out despite cough attempt by patient Pharyngeal- Thin Cup -- Pharyngeal -- Pharyngeal- Thin Straw -- Pharyngeal -- Pharyngeal- Puree Delayed swallow initiation-vallecula;Reduced pharyngeal peristalsis;Reduced epiglottic inversion;Reduced tongue base retraction;Penetration/Apiration after swallow;Pharyngeal residue - valleculae;Pharyngeal residue - pyriform;Pharyngeal residue - posterior pharnyx Pharyngeal Material enters airway, CONTACTS cords and then ejected out;Material does not enter airway Pharyngeal- Mechanical Soft -- Pharyngeal -- Pharyngeal- Regular -- Pharyngeal -- Pharyngeal- Multi-consistency -- Pharyngeal -- Pharyngeal- Pill -- Pharyngeal -- Pharyngeal Comment --  CHL IP CERVICAL ESOPHAGEAL PHASE 12/09/2018 Cervical Esophageal Phase Impaired Pudding Teaspoon --  Pudding Cup -- Honey Teaspoon -- Honey Cup -- Nectar Teaspoon -- Nectar Cup -- Nectar Straw -- Thin Teaspoon -- Thin Cup -- Thin Straw -- Puree Reduced cricopharyngeal relaxation;Prominent cricopharyngeal segment Mechanical Soft -- Regular -- Multi-consistency -- Pill -- Cervical Esophageal Comment -- Thank you, [name] Churn, [place] PORTER,DABNEY 12/09/2018, 4:33 PM                The results of significant diagnostics from this hospitalization (including imaging, microbiology, ancillary and laboratory) are listed below for reference.     Microbiology: No results found for this or any previous visit (from the past 240 hour(s)).   Labs: BNP (last 3 results) Recent Labs    12/14/2018 1110 12/07/18 0511  BNP 494.0* 239.0*  Basic Metabolic Panel: Recent Labs  Lab 12/10/18 0500 12/10/2018 0440 12/12/18 0447 12/13/18 0428 12/14/18 0623  NA 141 139 143 144 148*  K 4.4 4.7 4.6 4.8 5.0  CL 115* 115* 113* 113* 116*  CO2 14* 14* 16* 18* 19*  GLUCOSE 169* 166* 142* 90 117*  BUN 85* 108* 120* 134* 142*  CREATININE 5.26* 5.34* 5.33* 5.16* 5.44*  CALCIUM 8.4* 8.4* 8.6* 8.6* 8.5*  PHOS 7.7*  --   --   --   --    Liver Function Tests: Recent Labs  Lab 12/10/18 0500 12/04/2018 0440  AST  --  38  ALT  --  34  ALKPHOS  --  85  BILITOT  --  0.4  PROT  --  6.2*  ALBUMIN 2.4* 2.4*   No results for input(s): LIPASE, AMYLASE in the last 168 hours. No results for input(s): AMMONIA in the last 168 hours. CBC: Recent Labs  Lab 12/10/18 0500 12/02/2018 0440 12/12/18 0447 12/13/18 0428 12/14/18 0623  WBC 9.7 11.1* 12.7* 17.3* 17.8*  HGB 7.2* 7.4* 7.5* 8.3* 7.4*  HCT 23.5* 24.4* 24.8* 27.9* 24.4*  MCV 92.5 93.1 93.2 93.3 93.8  PLT 181 226 229 266 202   Cardiac Enzymes: No results for input(s): CKTOTAL, CKMB, CKMBINDEX, TROPONINI in the last 168 hours. BNP: Invalid input(s): POCBNP CBG: No results for input(s): GLUCAP in the last 168 hours. D-Dimer No results for  input(s): DDIMER in the last 72 hours. Hgb A1c No results for input(s): HGBA1C in the last 72 hours. Lipid Profile No results for input(s): CHOL, HDL, LDLCALC, TRIG, CHOLHDL, LDLDIRECT in the last 72 hours. Thyroid function studies No results for input(s): TSH, T4TOTAL, T3FREE, THYROIDAB in the last 72 hours.  Invalid input(s): FREET3 Anemia work up No results for input(s): VITAMINB12, FOLATE, FERRITIN, TIBC, IRON, RETICCTPCT in the last 72 hours. Urinalysis    Component Value Date/Time   COLORURINE YELLOW 11/30/2018 1255   APPEARANCEUR CLEAR 12/14/2018 1255   LABSPEC 1.013 12/04/2018 1255   PHURINE 5.0 11/25/2018 1255   GLUCOSEU NEGATIVE 12/10/2018 1255   [place] 12/07/2018 1255   [place] 11/25/2018 [place] 11/23/2018 1255   PROTEINUR 30 (A) 12/16/2018 1255   NITRITE NEGATIVE 12/10/2018 1255   LEUKOCYTESUR NEGATIVE 11/29/2018 1255   Sepsis Labs Invalid input(s): PROCALCITONIN,  WBC,  LACTICIDVEN Microbiology No results found for this or any previous visit (from the past 240 hour(s)).   Time coordinating discharge: 35 minutes  SIGNED:   Rodena Goldmann, DO [facility] 12/16/2018, 6:59 AM  If 7PM-7AM, please contact night-coverage [email or web address] Password [email or web address]

## 2018-12-18 NOTE — Progress Notes (Signed)
PROGRESS NOTE    Cameron Boyle  [name]:[id number] DOB: 1935/12/31 DOA: 11/24/2018 PCP: [name], MD (Inactive)   Brief Narrative: Per HPI: 83 y.o.malewith medical history ofdiastolic dysfunction, right MCA stroke, hypertension, hyperlipidemia, CKD stage IV presenting with 1 to 35-month history of dyspnea on exertion.Admitted on 11/28/2018 with tachycardia, volume overload symptoms as well as hemoglobin dropping down to 6.7, improved posttransfusion, developed pneumonia while undergoing bowel prep, query aspiration--- Failed MBS on 12/09/18--please see full report... Pt would be a candidate for UES dilation or Botox and try dysphagia therapy again following. In the meantime would likely need PEG tube to maintain nutritional/hydration needs.  Patient has received a PEG tube on 3/25 and appears to be feeling quite weak, but maintains adequate urine output. Nephrology consulted and following for advanced CKD with no plans for hemodialysis given patient's condition.  He has been transitioned to comfort measures at this time.  Anticipating discharge to hospice facility soon.  Assessment & Plan:   Active Problems:   Acute blood loss anemia   Acute on chronic diastolic CHF (congestive heart failure) (HCC)   Acute renal failure superimposed on stage 4 chronic kidney disease (HCC)   Transfusion-dependent anemia   Cough   Symptomatic anemia   Aspiration pneumonia due to regurgitated food ([place])   Dysphagia   Protein-calorie malnutrition, severe   Palliative care by specialist   Goals of care, counseling/discussion   DNR (do not resuscitate) discussion   PEG (percutaneous endoscopic gastrostomy) status ([place])  1. Acute on chronic blood loss anemia secondary to acute GI bleedand CKD.  No further overt bleeding currently identified.  Maintain on comfort measures for now.  No repeat CBC ordered. 2. Bilateral pneumonia likely secondary to aspiration.Completed course of IV Unasyn 7 days.   Discontinued today.  Continues to have recurrent aspiration. 3. Dysphagia. Patient has had chronic issues with this and may be related to prior stroke. He is now status post PEG tube placement with tube feedswell tolerated andminimal residuals. Keep head of bed elevated. Discussed withDr. [name], ENT at [facility] [phone] and [phone]) to see if patient is a candidate for UES dilatation, [name] states that due to COVID 19 transfer restrictions they cannot accept routine/elective transfers at this time ... Patient may see ENT as outpatient down the road in the meantime patient will most likely need PEG tube in order to meet his nutritional/hydration needs...... N.p.o. for now... May have medications crushed in applesauce until PEG tube is placed-- Per [name] --possible PEG tube placement on 12/14/2018 4. Acute on chronic diastolic CHF.Received Lasix 80 mg IV yesterday with 900 urine output noted. 5. AKI on CKD stage IV.  No plans for further hemodialysis per nephrology.  Comfort measures. 6. Essential hypertension-stable.  Blood pressures are beginning to get soft.  Will discontinue amlodipine and hydralazine. 7. Dyslipidemia.  Discontinue statin. 8. History of right MCA stroke. Discontinue aspirin. 9. Right rotator cuff injury. No further complaints in this region. Prednisone discontinued. 10. Acute hypoxemic respiratory failure likely secondary to aspiration pneumonia with underlying diastolic congestive heart failure.  Unfortunately worsening and will require transition to hospice facility soon.   DVT prophylaxis:SCDs Code Status:DNR/DNI-> comfort care Family Communication:Discussed with wife  Disposition Plan:Nephrology recommendations appreciated with no plans for initiation of hemodialysis noted.    Discontinue medications that are nonessential to comfort measures at this time.  Unasyn discontinued after 7-day course.  Maintain on  steroids for now.  Plan for transfer to hospice facility  in the near future and social work consulted.   Consultants:  General surgery  Nephrology  Procedures:  PEG tube placement 3/25  Antimicrobials:  Unasyn 3/23->3/26  Augmentin 3/26->3/27  Unasyn 3/27->3/29  Subjective: Patient seen and evaluated today with noted increasing lethargy and confusion this morning.  He was noted to have some agitation and shortness of breath overnight with extensive mucus production noted as well.  Objective: Vitals:   12/14/18 2132 21-Dec-2018 0452 2018/12/21 0630 12-21-2018 0722  BP:   118/60   Pulse: 62  97   Resp:   (!) 21   Temp:      TempSrc:   Oral   SpO2: 98%  100% 91%  Weight:  65.9 kg    Height:        Intake/Output Summary (Last 24 hours) at December 21, 2018 0902 Last data filed at 12/14/2018 1722 Gross per 24 hour  Intake -  Output 900 ml  Net -900 ml   Filed Weights   12/12/18 0500 12/14/18 0538 12/21/2018 0452  Weight: 67.1 kg 65.7 kg 65.9 kg    Examination:  General exam: Appears lethargic Respiratory system: Clear to auscultation. Respiratory effort normal.  Crackles noted bilaterally and currently on 11 L nasal cannula. Cardiovascular system: S1 & S2 heard, RRR. No JVD, murmurs, rubs, gallops or clicks. No pedal edema. Gastrointestinal system: Abdomen is nondistended, soft and nontender. No organomegaly or masses felt. Normal bowel sounds heard. Central nervous system: Cannot be evaluated. Extremities: Symmetric 5 x 5 power. Skin: No rashes, lesions or ulcers Psychiatry: Cannot be evaluated.    Data Reviewed: I have personally reviewed following labs and imaging studies  CBC: Recent Labs  Lab 12/10/18 0500 12/08/2018 0440 12/12/18 0447 12/13/18 0428 12/14/18 0623  WBC 9.7 11.1* 12.7* 17.3* 17.8*  HGB 7.2* 7.4* 7.5* 8.3* 7.4*  HCT 23.5* 24.4* 24.8* 27.9* 24.4*  MCV 92.5 93.1 93.2 93.3 93.8  PLT 181 226 229 266 564   Basic Metabolic Panel: Recent Labs   Lab 12/09/18 0452 12/10/18 0500 12/14/2018 0440 12/12/18 0447 12/13/18 0428 12/14/18 0623  NA 141 141 139 143 144 148*  K 4.5 4.4 4.7 4.6 4.8 5.0  CL 114* 115* 115* 113* 113* 116*  CO2 17* 14* 14* 16* 18* 19*  GLUCOSE 104* 169* 166* 142* 90 117*  BUN 83* 85* 108* 120* 134* 142*  CREATININE 5.15* 5.26* 5.34* 5.33* 5.16* 5.44*  CALCIUM 8.2* 8.4* 8.4* 8.6* 8.6* 8.5*  PHOS 7.3* 7.7*  --   --   --   --    GFR: Estimated Creatinine Clearance: 10.1 mL/min (A) (by C-G formula based on SCr of 5.44 mg/dL (H)). Liver Function Tests: Recent Labs  Lab 12/09/18 0452 12/10/18 0500 12/10/2018 0440  AST 47*  --  38  ALT 27  --  34  ALKPHOS 74  --  85  BILITOT 0.5  --  0.4  PROT 6.1*  --  6.2*  ALBUMIN 2.5*  2.5* 2.4* 2.4*   Recent Labs  Lab 12/09/18 0452  LIPASE 32   No results for input(s): AMMONIA in the last 168 hours. Coagulation Profile: No results for input(s): INR, PROTIME in the last 168 hours. Cardiac Enzymes: No results for input(s): CKTOTAL, CKMB, CKMBINDEX, TROPONINI in the last 168 hours. BNP (last 3 results) No results for input(s): PROBNP in the last 8760 hours. HbA1C: No results for input(s): HGBA1C in the last 72 hours. CBG: No results for input(s): GLUCAP in the last 168 hours. Lipid Profile: No results  for input(s): CHOL, HDL, LDLCALC, TRIG, CHOLHDL, LDLDIRECT in the last 72 hours. Thyroid Function Tests: No results for input(s): TSH, T4TOTAL, FREET4, T3FREE, THYROIDAB in the last 72 hours. Anemia Panel: No results for input(s): VITAMINB12, FOLATE, FERRITIN, TIBC, IRON, RETICCTPCT in the last 72 hours. Sepsis Labs: No results for input(s): PROCALCITON, LATICACIDVEN in the last 168 hours.  No results found for this or any previous visit (from the past 240 hour(s)).       Radiology Studies: No results found.      Scheduled Meds: . acetylcysteine  3 mL Nebulization BID  . albuterol  2.5 mg Nebulization TID  . budesonide (PULMICORT) nebulizer  solution  0.25 mg Nebulization BID  . feeding supplement (OSMOLITE 1.5 CAL)  300 mL Per Tube QID  . feeding supplement (PRO-STAT SUGAR FREE 64)  30 mL Per Tube Daily  . free water  200 mL Per Tube Q8H  . methylPREDNISolone (SOLU-MEDROL) injection  40 mg Intravenous Q12H  . pantoprazole sodium  40 mg Per Tube BID  . sodium bicarbonate  1,300 mg Oral BID  . sodium chloride flush  3 mL Intravenous Q12H   Continuous Infusions: . sodium chloride    . sodium chloride 20 mL/hr at 12/07/2018 2038     LOS: 11 days    Time spent: 30 minutes    Kim Lauver Darleen Crocker, DO [facility] Pager [phone]  If 7PM-7AM, please contact night-coverage [email or web address] Password [email or web address] 2018/12/16, 9:02 AM

## 2018-12-18 NOTE — Progress Notes (Signed)
Pharmacy Antibiotic Note  Today is day #7 of Unasyn therapy for this 13 yom admitted for aspiration pneumonia.  WBC count was still elevated yesterday, but patient is currently afebrile.   Renal function remains unstable.  Plan: Unasyn 3gm IV q24h F/U cxs and clinical progress Monitor V/S and labs  Height: 5\' 10"  (177.8 cm) Weight: 145 lb 4.5 oz (65.9 kg) IBW/kg (Calculated) : 73  Temp (24hrs), Avg:97.7 F (36.5 C), Min:97.7 F (36.5 C), Max:97.7 F (36.5 C)  Recent Labs  Lab 12/10/18 0500 11/24/2018 0440 12/12/18 0447 12/13/18 0428 12/14/18 0623  WBC 9.7 11.1* 12.7* 17.3* 17.8*  CREATININE 5.26* 5.34* 5.33* 5.16* 5.44*    Estimated Creatinine Clearance: 10.1 mL/min (A) (by C-G formula based on SCr of 5.44 mg/dL (H)).    No Known Allergies  Antimicrobials this admission: Unasyn 3/23>>  Ceftriaxone 3/20>> 3/23 Azithromycin 3/20>> 3/23   Microbiology results: n/a   Thank you for allowing pharmacy to be a part of this patient's care.  Despina Pole, Pharm. D. Clinical Pharmacist 12-24-18 8:26 AM

## 2018-12-18 NOTE — Progress Notes (Signed)
Physical Therapy Treatment Patient Details Name: Cameron Boyle MRN: [id number] DOB: 1936/06/22 Today's Date: December 18, 2018    History of Present Illness Cameron Boyle is a 83 y.o. male with medical history of diastolic dysfunction, right MCA stroke, hypertension, hyperlipidemia, CKD stage IV presenting with 1 to 52-month history of dyspnea on exertion.  Patient had routine blood work obtained by his PCP on 11/28/2018.  His hemoglobin was 6.3.  However further history reveals that the patient has had increasing generalized weakness, fatigue, and dyspnea on exertion for nearly 2 months.  He also complains of increasing lower extremity edema.  He denies any frank PND orthopnea.  He denies any chest pain, fevers, chills, coughing, hemoptysis.  There is been no hematochezia, melena, hematemesis, hematuria.    PT Comments    Patient demonstrates slow labored movement for sitting up at bedside, able to complete some exercises with verbal cueing and demonstration, required active assistance to complete LAQ's and marching in place, limited to a few steps to transfer to chair due to BLE weakness/poor standing balance.  Patient tolerated sitting up in chair after therapy - nursing staff notified.  Patient will benefit from continued physical therapy in hospital and recommended venue below to increase strength, balance, endurance for safe ADLs and gait.    Follow Up Recommendations  SNF     Equipment Recommendations  None recommended by PT    Recommendations for Other Services       Precautions / Restrictions Precautions Precautions: Fall Restrictions Weight Bearing Restrictions: No    Mobility  Bed Mobility Overal bed mobility: Needs Assistance Bed Mobility: Supine to Sit     Supine to sit: Min assist     General bed mobility comments: increased time, labored movement  Transfers Overall transfer level: Needs assistance Equipment used: Rolling walker (2 wheeled) Transfers: Sit to/from  [facility] Sit to Stand: Min assist Stand pivot transfers: Min assist;Mod assist       General transfer comment: slow labored unsteady movement, limited secondary to fatigue/poor standing balance  Ambulation/Gait   Gait Distance (Feet): 5 Feet Assistive device: Rolling walker (2 wheeled) Gait Pattern/deviations: Decreased step length - right;Decreased step length - left;Decreased stride length Gait velocity: decreased   General Gait Details: limited to 6-7 slow unsteady short steps at bedside due to poor standing balance/weakness   Stairs             Wheelchair Mobility    Modified Rankin (Stroke Patients Only)       Balance Overall balance assessment: Needs assistance Sitting-balance support: Feet supported;No upper extremity supported Sitting balance-Leahy Scale: Good     Standing balance support: Bilateral upper extremity supported;During functional activity Standing balance-Leahy Scale: Poor Standing balance comment: fair/poor using RW                            Cognition Arousal/Alertness: Awake/alert;Lethargic Behavior During Therapy: WFL for tasks assessed/performed Overall Cognitive Status: Within Functional Limits for tasks assessed Area of Impairment: Following commands                       Following Commands: Follows one step commands inconsistently;Follows one step commands with increased time              Exercises General Exercises - Lower Extremity Long Arc Quad: Seated;Strengthening;AROM;Both;10 reps Hip Flexion/Marching: Seated;Strengthening;AAROM;Both;10 reps Toe Raises: Seated;Strengthening;AROM;Both;5 reps Heel Raises: Seated;AROM;Strengthening;Both;10 reps    General Comments  Pertinent Vitals/Pain Pain Assessment: No/denies pain    Home Living                      Prior Function            PT Goals (current goals can now be found in the care plan section) Acute  Rehab PT Goals Patient Stated Goal: to go home PT Goal Formulation: With patient Time For Goal Achievement: 12/22/18 Potential to Achieve Goals: Fair Progress towards PT goals: Progressing toward goals    Frequency    Min 3X/week      PT Plan Current plan remains appropriate    Co-evaluation              AM-PAC PT "6 Clicks" Mobility   Outcome Measure  Help needed turning from your back to your side while in a flat bed without using bedrails?: None Help needed moving from lying on your back to sitting on the side of a flat bed without using bedrails?: A Little Help needed moving to and from a bed to a chair (including a wheelchair)?: A Lot Help needed standing up from a chair using your arms (e.g., wheelchair or bedside chair)?: A Lot Help needed to walk in hospital room?: A Lot Help needed climbing 3-5 steps with a railing? : Total 6 Click Score: 14    End of Session Equipment Utilized During Treatment: Gait belt Activity Tolerance: Patient tolerated treatment well;Patient limited by fatigue Patient left: in chair;with call bell/phone within reach;with chair alarm set Nurse Communication: Mobility status PT Visit Diagnosis: Unsteadiness on feet (R26.81);Muscle weakness (generalized) (M62.81);Difficulty in walking, not elsewhere classified (R26.2)     Time: 7116-5790 PT Time Calculation (min) (ACUTE ONLY): 26 min  Charges:  $Therapeutic Exercise: 8-22 mins $Therapeutic Activity: 8-22 mins                     10:42 AM, 01-14-19 Lonell Grandchild, MPT Physical Therapist with [facility] [phone] office [phone] mobile phone

## 2018-12-18 NOTE — Progress Notes (Signed)
Patient is in Pulmonary edema and actively dying, suctioned out pink frothy secretions, copious

## 2018-12-18 NOTE — Progress Notes (Addendum)
This RN to check on patient. Patient lying in bed. No respirations or pulse. Two RN's to verify death. Time of death December 11, 2014. Dr. Shanon Brow notified. Order for two RN's to pronounce death ordered.  Wife, Kipp Shank, notified.

## 2018-12-18 DEATH — deceased

## 2018-12-24 ENCOUNTER — Ambulatory Visit: Payer: Medicare PPO | Admitting: Adult Health

## 2019-02-27 IMAGING — RF DG SWALLOWING FUNCTION
1 series · 1 of 1 positions shown · non-contrast
Comparison: None

CLINICAL DATA: Stroke in June 2018, dysphagia

EXAM:
MODIFIED BARIUM SWALLOW
TECHNIQUE: Different consistencies of barium were administered orally to the
patient by the Speech Pathologist. Imaging of the pharynx was
performed in the lateral projection.
FLUOROSCOPY TIME:  Fluoroscopy Time:  4 minutes 36 seconds
Radiation Exposure Index (if provided by the fluoroscopic device):
59.6 mGy
Number of Acquired Spot Images: multiple fluoroscopic screen
captures

[Series 1: cp_standard · 0.25mm/px · 1 of 1 slices shown]
[im 1/1]
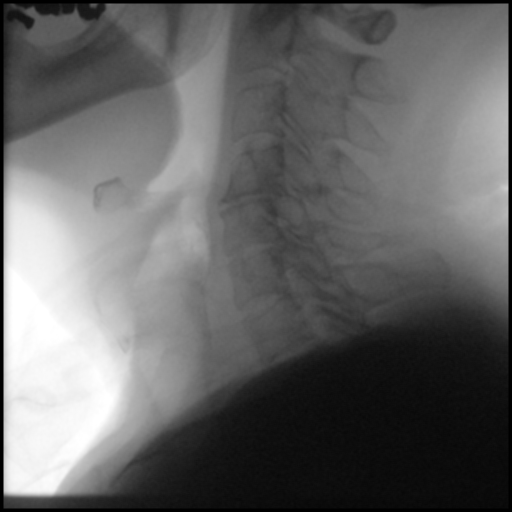

[1 of 1 positions shown; findings below may reference images not displayed]

FINDINGS: Thin liquid-failure of epiglottic inversion. Premature spillover
with delayed initiation. Laryngeal penetration to the level of the
vocal cords with occasional aspiration. Significant piriform sinus
residuals bilaterally. Piriform sinus residuals decreased with head
turns. Laryngeal penetration however stool occurred. Spontaneous
cough reflex was demonstrated.

Nectar thick liquid-failure of epiglottic inversion. Laryngeal
penetration. Significant piriform sinus residuals bilaterally. No
witnessed aspiration with nectar thick consistency though patient
had an episode of coughing following a swallow, not during live
imaging, suspicious for aspiration. Piriform sinus residuals
decreased with head turns.

Honey-failure of epiglottic inversion. No laryngeal penetration or
aspiration. BILATERAL piriform sinus residuals.

Pure-no epiglottic inversion. No laryngeal penetration or
aspiration. Significant vallecular and piriform sinus residuals.
Clearance of residuals was achieved by multiple follow-up swallows
with head turns.

Cracker-not evaluated

Pure with cracker-not evaluated

Barium tablet-swallow with applesauce. No epiglottic inversion.
Tablet lodged in LEFT piriform sinus, cleared by a second swallow of
applesauce..
IMPRESSION: Swallowing dysfunction as above.

Please refer to the Speech Pathologists report for complete details
and recommendations.

## 2019-05-07 IMAGING — DX CHEST - 2 VIEW
2 series · 2 of 2 positions shown · non-contrast
Comparison: 09/02/2013

CLINICAL DATA: Cough

EXAM:
CHEST - 2 VIEW

[chest pa]
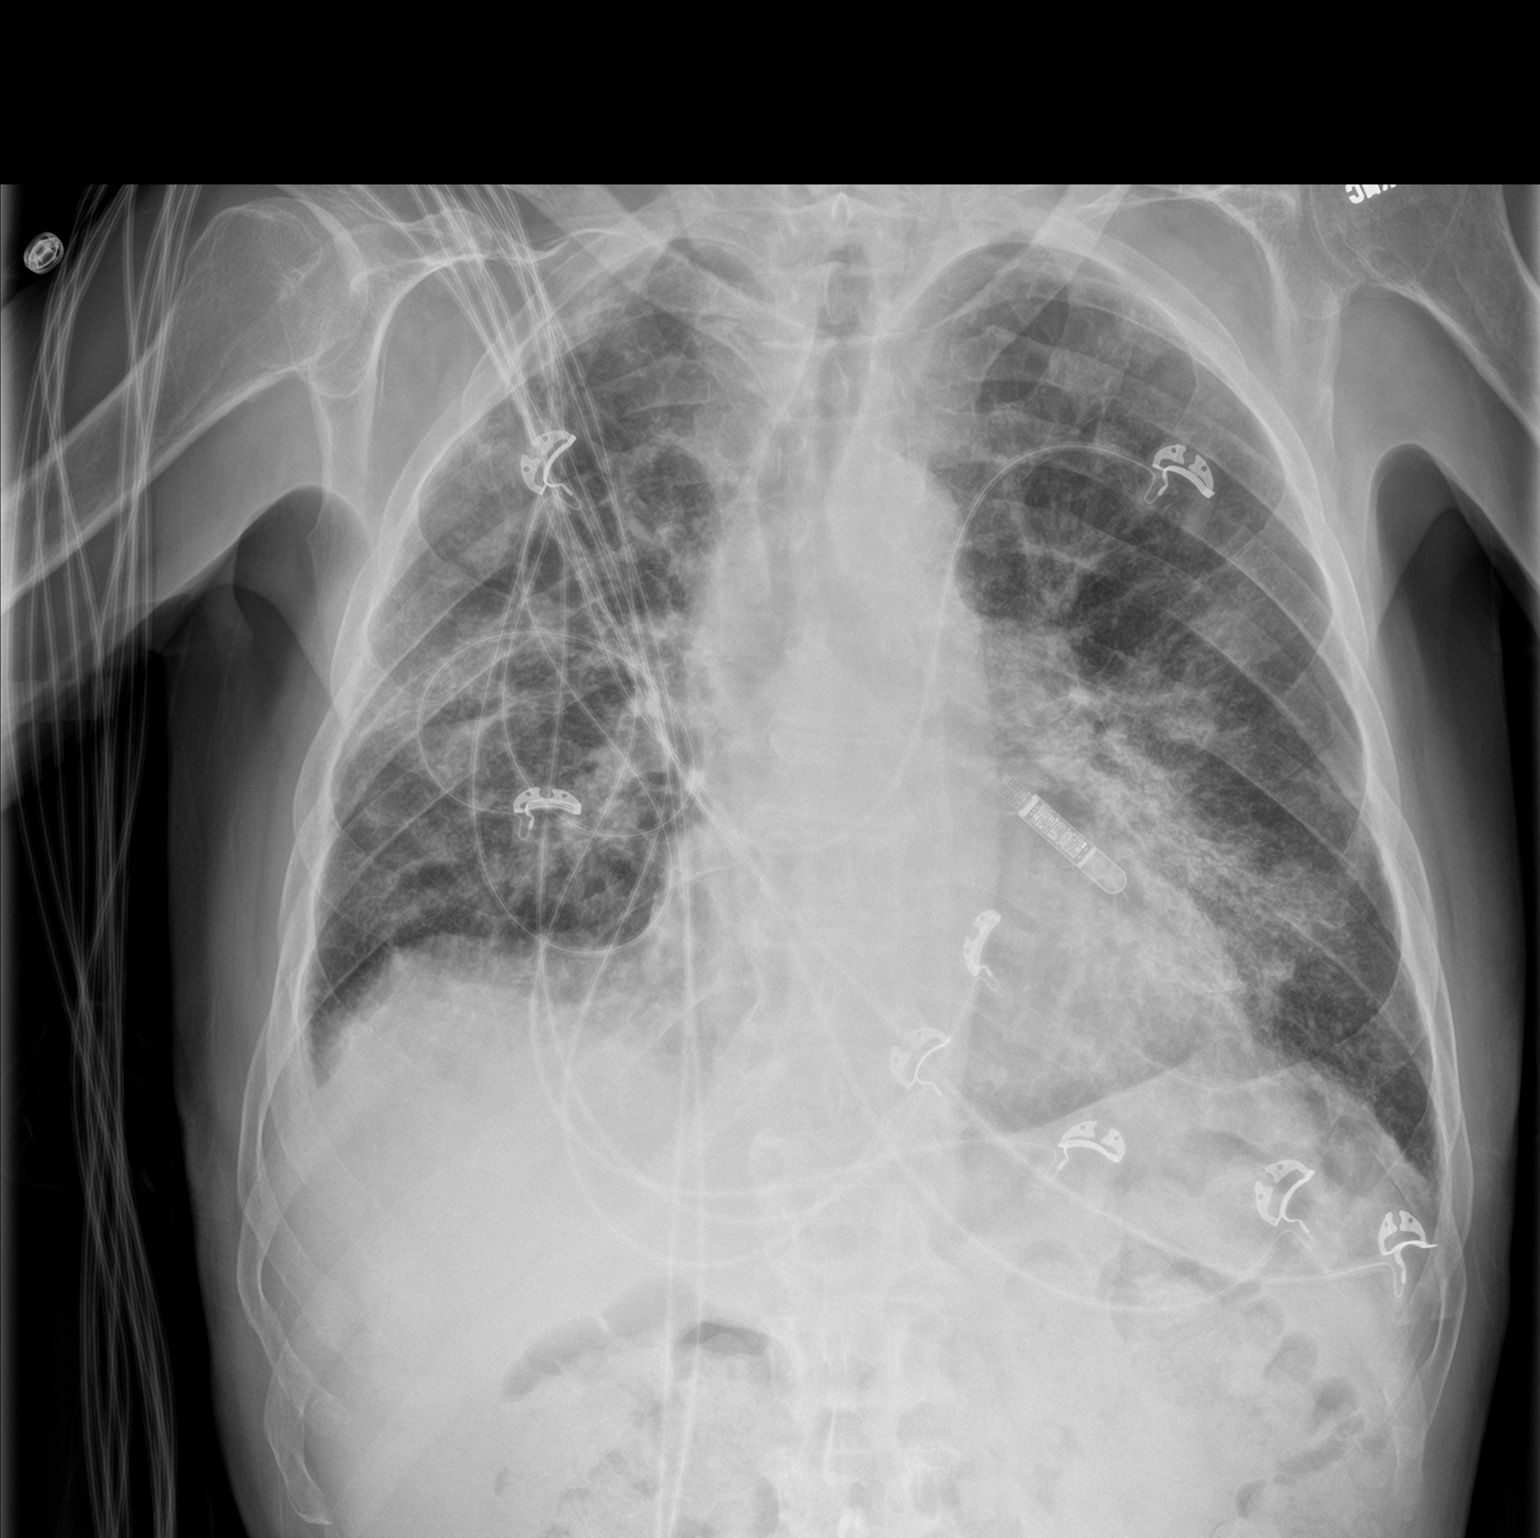

[chest lat]
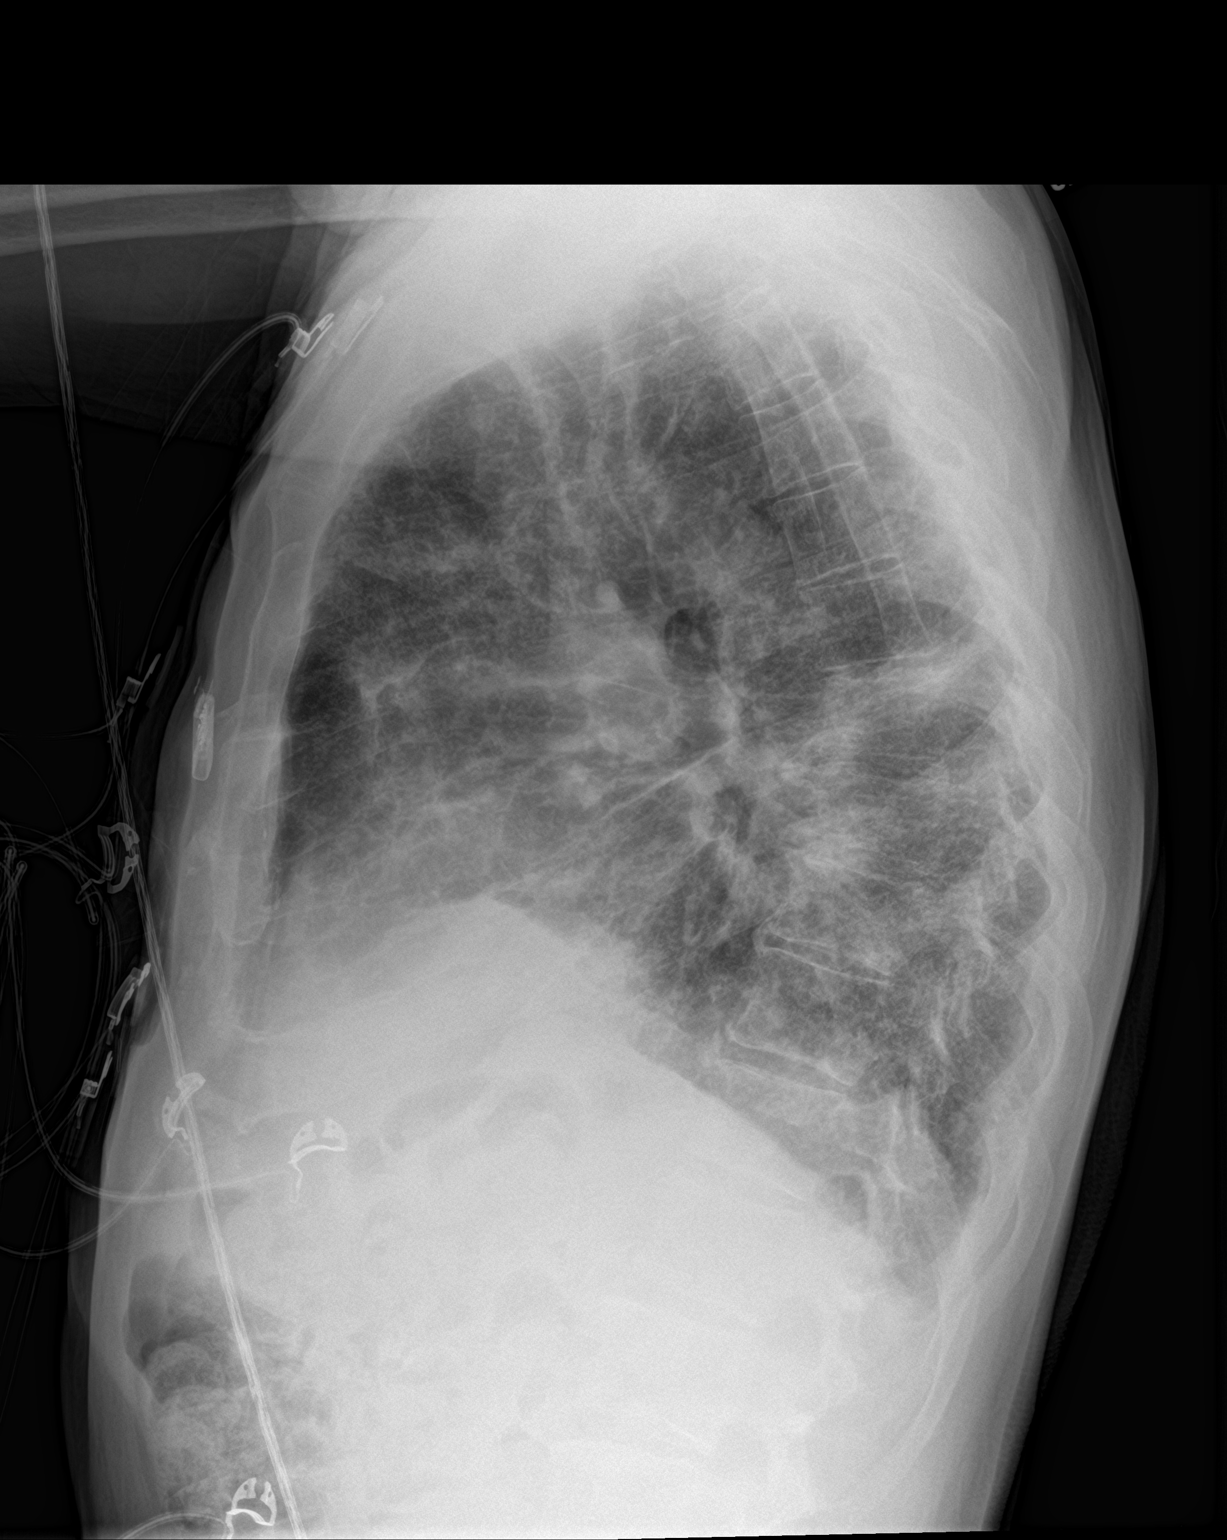

[2 of 2 positions shown; findings below may reference images not displayed]

FINDINGS: There are patchy areas of interstitial and alveolar airspace disease
in the upper and lower lobes bilaterally concerning for interstitial
pneumonitis secondary to an infectious or inflammatory etiology
versus mild pulmonary edema. There is no pleural effusion or
pneumothorax. The heart and mediastinal contours are unremarkable.

The osseous structures are unremarkable.
IMPRESSION: Patchy areas of interstitial and alveolar airspace disease in the
upper and lower lobes bilaterally concerning for interstitial
pneumonitis secondary to an infectious or inflammatory etiology
versus mild pulmonary edema.

## 2019-05-12 IMAGING — CR RIGHT SHOULDER - 2+ VIEW
3 series · 3 of 3 positions shown · non-contrast
Comparison: None.

CLINICAL DATA: Right shoulder pain. No known injury.

EXAM:
RIGHT SHOULDER - 2+ VIEW

[grashey (1 of 2)]
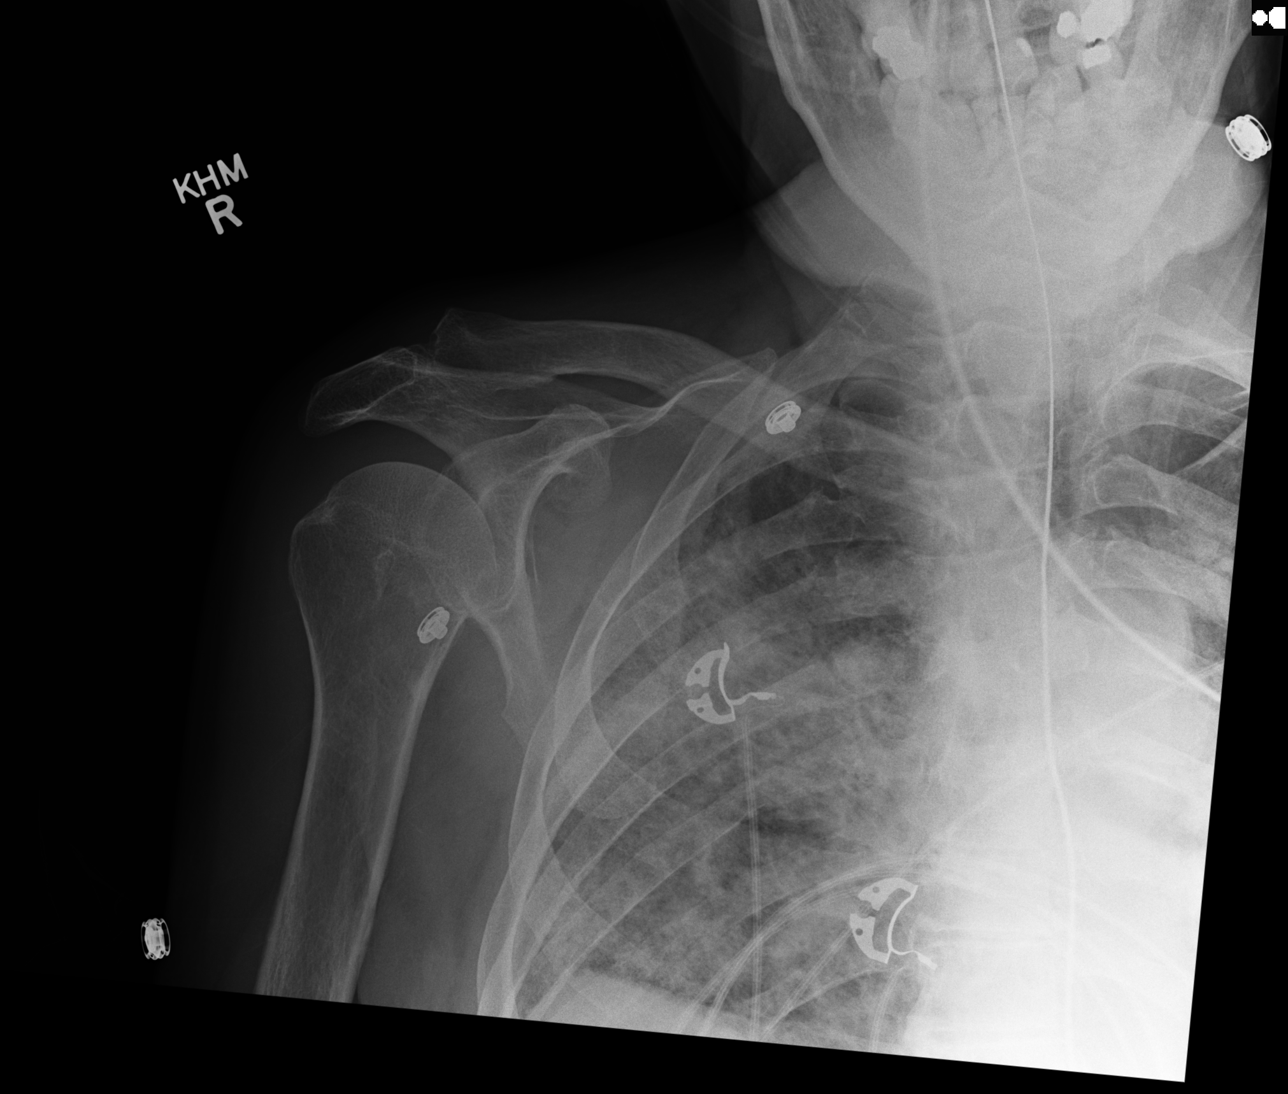

[scapula y]
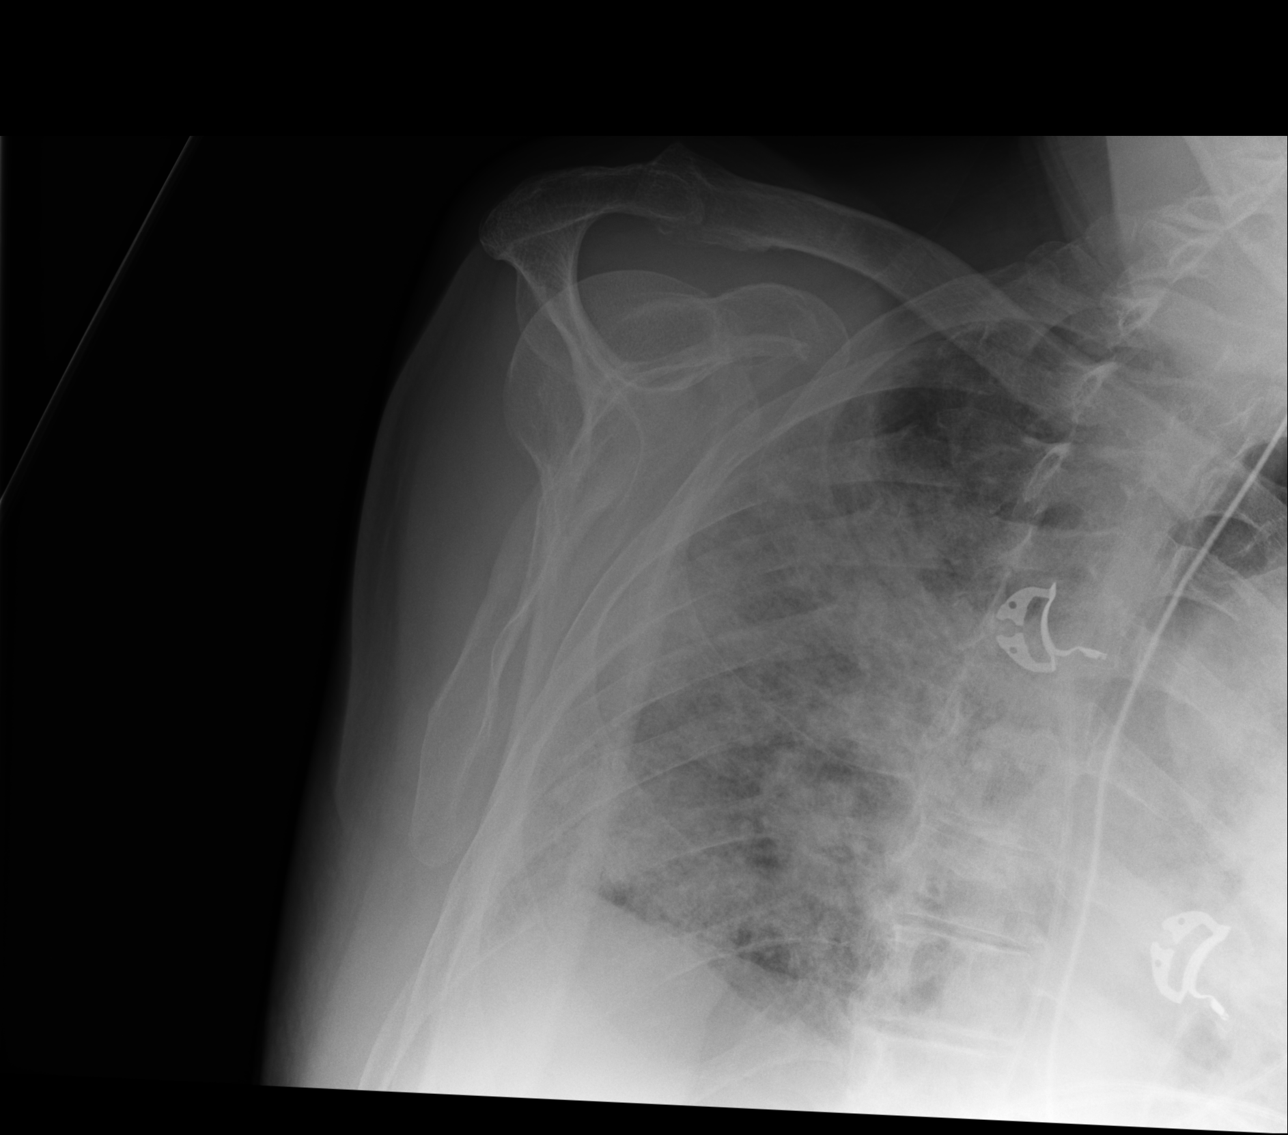

[grashey (2 of 2)]
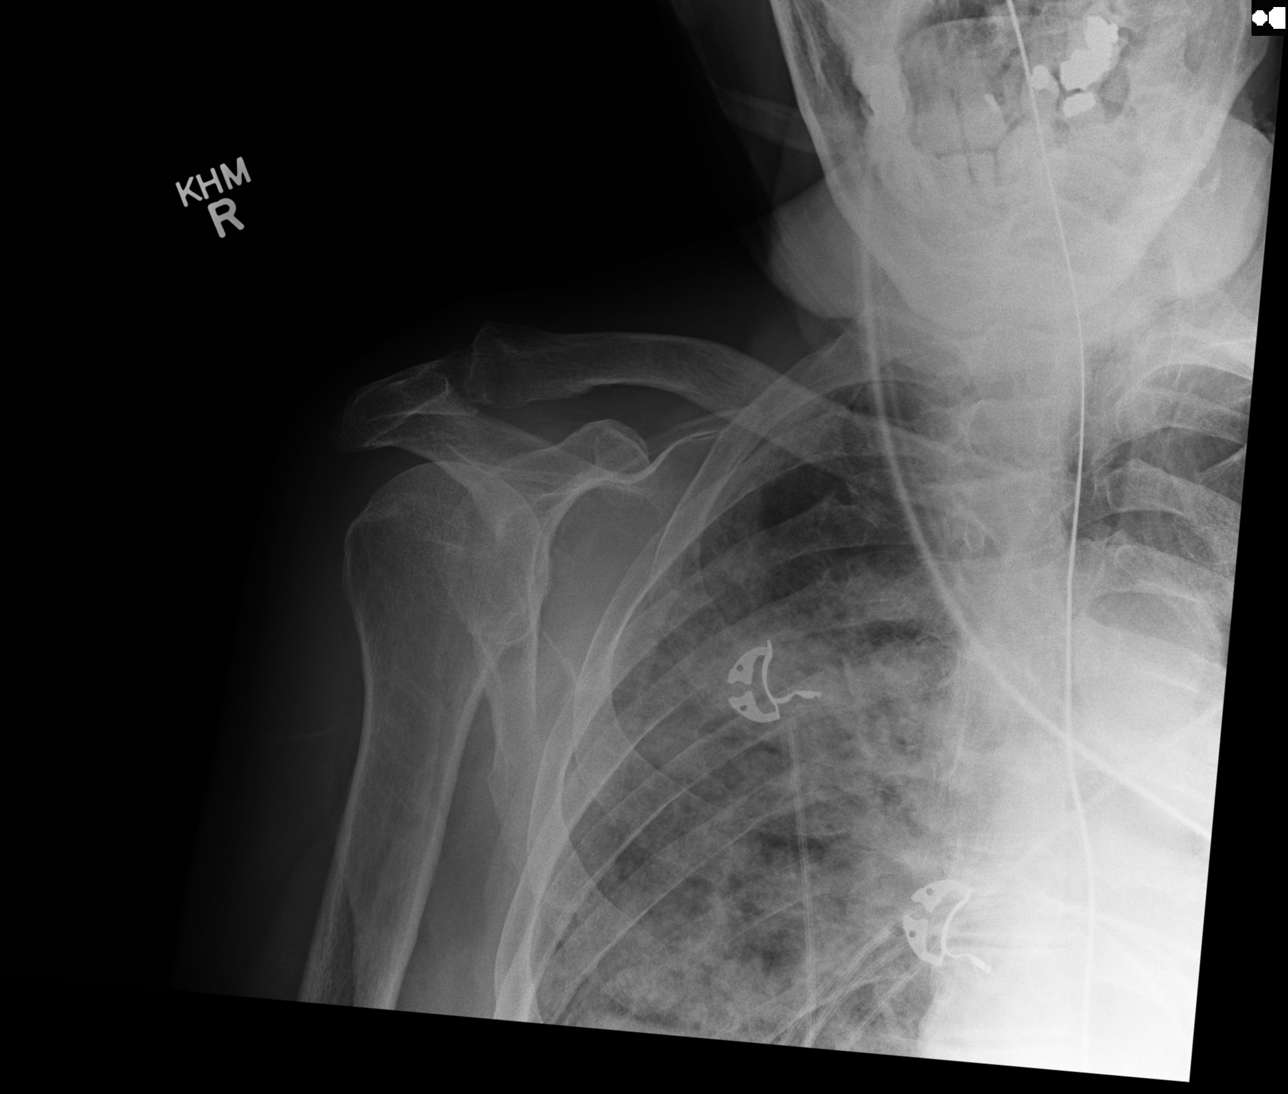

[3 of 3 positions shown; findings below may reference images not displayed]

FINDINGS: No fracture or dislocation. Mild acromioclavicular degenerative
change. No bony destruction or suspicious bone lesion. No
periarticular soft tissue calcifications.
IMPRESSION: Mild acromioclavicular degenerative change. No acute bony
abnormality.
